# Patient Record
Sex: Male | Born: 1940 | Smoking: Never smoker
Health system: Southern US, Community
[De-identification: ages and names within clinical notes are randomized; demographics above are authoritative.]

## PROBLEM LIST (undated history)

## (undated) DIAGNOSIS — E119 Type 2 diabetes mellitus without complications: Secondary | ICD-10-CM

## (undated) DIAGNOSIS — E785 Hyperlipidemia, unspecified: Secondary | ICD-10-CM

## (undated) DIAGNOSIS — I4510 Unspecified right bundle-branch block: Secondary | ICD-10-CM

## (undated) DIAGNOSIS — A0100 Typhoid fever, unspecified: Secondary | ICD-10-CM

---

## 2012-08-19 ENCOUNTER — Encounter (HOSPITAL_COMMUNITY)
Admission: EM | Disposition: A | Discharge status: Home / Self Care | Payer: Self-pay | Source: Other Acute Inpatient Hospital | Attending: Neurosurgery

## 2012-08-19 ENCOUNTER — Encounter (HOSPITAL_COMMUNITY): Payer: Self-pay | Admitting: *Deleted

## 2012-08-19 ENCOUNTER — Inpatient Hospital Stay (HOSPITAL_COMMUNITY): Payer: BC Managed Care – PPO | Admitting: Certified Registered Nurse Anesthetist

## 2012-08-19 ENCOUNTER — Inpatient Hospital Stay (HOSPITAL_COMMUNITY)
Admission: EM | Admit: 2012-08-19 | Discharge: 2012-09-03 | DRG: 530 | Disposition: A | Discharge status: Home / Self Care | Payer: BC Managed Care – PPO | Source: Other Acute Inpatient Hospital | Attending: Neurosurgery | Admitting: Neurosurgery

## 2012-08-19 ENCOUNTER — Encounter (HOSPITAL_COMMUNITY): Payer: Self-pay | Admitting: Certified Registered Nurse Anesthetist

## 2012-08-19 DIAGNOSIS — I6200 Nontraumatic subdural hemorrhage, unspecified: Principal | ICD-10-CM | POA: Diagnosis present

## 2012-08-19 DIAGNOSIS — I129 Hypertensive chronic kidney disease with stage 1 through stage 4 chronic kidney disease, or unspecified chronic kidney disease: Secondary | ICD-10-CM | POA: Diagnosis present

## 2012-08-19 DIAGNOSIS — G9389 Other specified disorders of brain: Secondary | ICD-10-CM | POA: Diagnosis not present

## 2012-08-19 DIAGNOSIS — Z6835 Body mass index (BMI) 35.0-35.9, adult: Secondary | ICD-10-CM

## 2012-08-19 DIAGNOSIS — E785 Hyperlipidemia, unspecified: Secondary | ICD-10-CM | POA: Diagnosis present

## 2012-08-19 DIAGNOSIS — Z87891 Personal history of nicotine dependence: Secondary | ICD-10-CM

## 2012-08-19 DIAGNOSIS — E119 Type 2 diabetes mellitus without complications: Secondary | ICD-10-CM

## 2012-08-19 DIAGNOSIS — E669 Obesity, unspecified: Secondary | ICD-10-CM | POA: Diagnosis present

## 2012-08-19 DIAGNOSIS — D649 Anemia, unspecified: Secondary | ICD-10-CM

## 2012-08-19 DIAGNOSIS — F070 Personality change due to known physiological condition: Secondary | ICD-10-CM | POA: Diagnosis present

## 2012-08-19 DIAGNOSIS — E875 Hyperkalemia: Secondary | ICD-10-CM

## 2012-08-19 DIAGNOSIS — S065X9A Traumatic subdural hemorrhage with loss of consciousness of unspecified duration, initial encounter: Secondary | ICD-10-CM | POA: Diagnosis present

## 2012-08-19 DIAGNOSIS — N183 Chronic kidney disease, stage 3 unspecified: Secondary | ICD-10-CM | POA: Diagnosis present

## 2012-08-19 DIAGNOSIS — I10 Essential (primary) hypertension: Secondary | ICD-10-CM

## 2012-08-19 DIAGNOSIS — J95821 Acute postprocedural respiratory failure: Secondary | ICD-10-CM

## 2012-08-19 DIAGNOSIS — S065XAA Traumatic subdural hemorrhage with loss of consciousness status unknown, initial encounter: Secondary | ICD-10-CM

## 2012-08-19 HISTORY — DX: Hyperlipidemia, unspecified: E78.5

## 2012-08-19 HISTORY — DX: Type 2 diabetes mellitus without complications: E11.9

## 2012-08-19 HISTORY — PX: CRANIOTOMY: SHX93

## 2012-08-19 LAB — BASIC METABOLIC PANEL
Calcium: 8.9 mg/dL (ref 8.4–10.5)
GFR calc non Af Amer: 41 mL/min — ABNORMAL LOW (ref >90)
Potassium: 5.2 mEq/L — ABNORMAL HIGH (ref 3.5–5.1)
Sodium: 136 mEq/L (ref 135–145)

## 2012-08-19 LAB — CBC
Hemoglobin: 10.9 g/dL — ABNORMAL LOW (ref 13.0–17.0)
Platelets: 148 10*3/uL — ABNORMAL LOW (ref 150–400)
RBC: 3.74 MIL/uL — ABNORMAL LOW (ref 4.22–5.81)
WBC: 8.5 10*3/uL (ref 4.0–10.5)

## 2012-08-19 LAB — GLUCOSE, CAPILLARY: Glucose-Capillary: 113 mg/dL — ABNORMAL HIGH (ref 70–99)

## 2012-08-19 LAB — MRSA PCR SCREENING: MRSA by PCR: NEGATIVE

## 2012-08-19 SURGERY — CRANIOTOMY HEMATOMA EVACUATION SUBDURAL
Anesthesia: General | Site: Head | Laterality: Right | Wound class: Clean

## 2012-08-19 MED ORDER — PROMETHAZINE HCL 25 MG PO TABS: 12.5000 mg | ORAL_TABLET | ORAL | Status: DC | PRN

## 2012-08-19 MED ORDER — DEXTROSE 5 % IV SOLN
3.0000 g | INTRAVENOUS | Status: DC | PRN
  Administered 2012-08-19: 3 g via INTRAVENOUS

## 2012-08-19 MED ORDER — DEXTROSE 5 % IV SOLN
INTRAVENOUS | Status: DC | PRN
  Administered 2012-08-19: 20:00:00 via INTRAVENOUS

## 2012-08-19 MED ORDER — ONDANSETRON HCL 4 MG/2ML IJ SOLN: 4.0000 mg | INTRAMUSCULAR | Status: DC | PRN

## 2012-08-19 MED ORDER — SURGIFOAM 100 EX MISC
CUTANEOUS | Status: DC | PRN
  Administered 2012-08-19: 20:00:00 via TOPICAL

## 2012-08-19 MED ORDER — POTASSIUM CHLORIDE IN NACL 20-0.45 MEQ/L-% IV SOLN
INTRAVENOUS | Status: DC
  Administered 2012-08-19: 22:00:00 via INTRAVENOUS
  Filled 2012-08-19 (×2): qty 1000

## 2012-08-19 MED ORDER — NEOSTIGMINE METHYLSULFATE 1 MG/ML IJ SOLN
INTRAMUSCULAR | Status: DC | PRN
  Administered 2012-08-19: 5 mg via INTRAVENOUS

## 2012-08-19 MED ORDER — HYDROMORPHONE HCL PF 1 MG/ML IJ SOLN: 0.5000 mg | INTRAMUSCULAR | Status: DC | PRN

## 2012-08-19 MED ORDER — ACETAMINOPHEN 650 MG RE SUPP: 650.0000 mg | RECTAL | Status: DC | PRN

## 2012-08-19 MED ORDER — SODIUM CHLORIDE 0.9 % IV SOLN
500.0000 mg | Freq: Two times a day (BID) | INTRAVENOUS | Status: DC
  Administered 2012-08-19 – 2012-08-21 (×5): 500 mg via INTRAVENOUS
  Filled 2012-08-19 (×7): qty 5

## 2012-08-19 MED ORDER — MICROFIBRILLAR COLL HEMOSTAT EX PADS
MEDICATED_PAD | CUTANEOUS | Status: DC | PRN
  Administered 2012-08-19: 1 via TOPICAL

## 2012-08-19 MED ORDER — ACETAMINOPHEN 325 MG PO TABS
650.0000 mg | ORAL_TABLET | ORAL | Status: DC | PRN
  Administered 2012-08-24 – 2012-09-01 (×5): 650 mg via ORAL
  Filled 2012-08-19 (×5): qty 2

## 2012-08-19 MED ORDER — CEFAZOLIN SODIUM-DEXTROSE 2-3 GM-% IV SOLR
2.0000 g | Freq: Three times a day (TID) | INTRAVENOUS | Status: AC
Start: 2012-08-20 — End: 2012-08-20
  Administered 2012-08-20 (×2): 2 g via INTRAVENOUS
  Filled 2012-08-19 (×2): qty 50

## 2012-08-19 MED ORDER — ARTIFICIAL TEARS OP OINT
TOPICAL_OINTMENT | OPHTHALMIC | Status: DC | PRN
  Administered 2012-08-19: 1 via OPHTHALMIC

## 2012-08-19 MED ORDER — PHENYLEPHRINE HCL 10 MG/ML IJ SOLN
INTRAMUSCULAR | Status: DC | PRN
  Administered 2012-08-19 (×2): 80 ug via INTRAVENOUS

## 2012-08-19 MED ORDER — SODIUM CHLORIDE 0.9 % IR SOLN
Status: DC | PRN
  Administered 2012-08-19: 20:00:00

## 2012-08-19 MED ORDER — ONDANSETRON HCL 4 MG/2ML IJ SOLN: 4.0000 mg | Freq: Once | INTRAMUSCULAR | Status: DC | PRN

## 2012-08-19 MED ORDER — LIDOCAINE HCL 4 % MT SOLN
OROMUCOSAL | Status: DC | PRN
  Administered 2012-08-19: 4 mL via TOPICAL

## 2012-08-19 MED ORDER — ONDANSETRON HCL 4 MG PO TABS: 4.0000 mg | ORAL_TABLET | ORAL | Status: DC | PRN

## 2012-08-19 MED ORDER — GLYCOPYRROLATE 0.2 MG/ML IJ SOLN
INTRAMUSCULAR | Status: DC | PRN
  Administered 2012-08-19: .7 mg via INTRAVENOUS

## 2012-08-19 MED ORDER — PANTOPRAZOLE SODIUM 40 MG IV SOLR
40.0000 mg | Freq: Every day | INTRAVENOUS | Status: DC
  Administered 2012-08-19 – 2012-08-21 (×3): 40 mg via INTRAVENOUS
  Filled 2012-08-19 (×4): qty 40

## 2012-08-19 MED ORDER — ROCURONIUM BROMIDE 100 MG/10ML IV SOLN
INTRAVENOUS | Status: DC | PRN
Start: 2012-08-19 — End: 2012-08-19
  Administered 2012-08-19: 50 mg via INTRAVENOUS

## 2012-08-19 MED ORDER — ONDANSETRON HCL 4 MG/2ML IJ SOLN
INTRAMUSCULAR | Status: DC | PRN
  Administered 2012-08-19: 4 mg via INTRAVENOUS

## 2012-08-19 MED ORDER — FENTANYL CITRATE 0.05 MG/ML IJ SOLN
INTRAMUSCULAR | Status: DC | PRN
  Administered 2012-08-19: 100 ug via INTRAVENOUS
  Administered 2012-08-19 (×3): 50 ug via INTRAVENOUS

## 2012-08-19 MED ORDER — DOCUSATE SODIUM 100 MG PO CAPS
100.0000 mg | ORAL_CAPSULE | Freq: Two times a day (BID) | ORAL | Status: DC
  Administered 2012-08-20 – 2012-09-03 (×29): 100 mg via ORAL
  Filled 2012-08-19 (×30): qty 1

## 2012-08-19 MED ORDER — SODIUM CHLORIDE 0.9 % IV SOLN
INTRAVENOUS | Status: DC | PRN
  Administered 2012-08-19 (×2): via INTRAVENOUS

## 2012-08-19 MED ORDER — LABETALOL HCL 5 MG/ML IV SOLN
10.0000 mg | INTRAVENOUS | Status: DC | PRN
  Administered 2012-08-19 (×2): 10 mg via INTRAVENOUS
  Administered 2012-08-19 – 2012-08-22 (×2): 20 mg via INTRAVENOUS
  Administered 2012-08-22: 10 mg via INTRAVENOUS
  Administered 2012-08-23 – 2012-08-24 (×2): 20 mg via INTRAVENOUS
  Filled 2012-08-19 (×3): qty 4
  Filled 2012-08-19: qty 8
  Filled 2012-08-19 (×2): qty 4

## 2012-08-19 MED ORDER — LIDOCAINE HCL (CARDIAC) 20 MG/ML IV SOLN
INTRAVENOUS | Status: DC | PRN
  Administered 2012-08-19: 100 mg via INTRAVENOUS

## 2012-08-19 MED ORDER — SODIUM CHLORIDE 0.9 % IV SOLN
10.0000 mg | INTRAVENOUS | Status: DC | PRN
  Administered 2012-08-19: 50 ug/min via INTRAVENOUS

## 2012-08-19 MED ORDER — LIDOCAINE-EPINEPHRINE 1 %-1:100000 IJ SOLN
INTRAMUSCULAR | Status: DC | PRN
  Administered 2012-08-19: 10 mL via INTRADERMAL

## 2012-08-19 MED ORDER — EPHEDRINE SULFATE 50 MG/ML IJ SOLN
INTRAMUSCULAR | Status: DC | PRN
  Administered 2012-08-19: 10 mg via INTRAVENOUS

## 2012-08-19 MED ORDER — 0.9 % SODIUM CHLORIDE (POUR BTL) OPTIME
TOPICAL | Status: DC | PRN
  Administered 2012-08-19 (×2): 1000 mL

## 2012-08-19 MED ORDER — HYDROCODONE-ACETAMINOPHEN 5-325 MG PO TABS
1.0000 | ORAL_TABLET | ORAL | Status: DC | PRN
  Administered 2012-08-20 – 2012-08-24 (×3): 1 via ORAL
  Filled 2012-08-19 (×3): qty 1

## 2012-08-19 MED ORDER — PROPOFOL 10 MG/ML IV BOLUS
INTRAVENOUS | Status: DC | PRN
  Administered 2012-08-19: 150 mg via INTRAVENOUS

## 2012-08-19 MED ORDER — FENTANYL CITRATE 0.05 MG/ML IJ SOLN: 25.0000 ug | INTRAMUSCULAR | Status: DC | PRN

## 2012-08-19 SURGICAL SUPPLY — 77 items
BAG DECANTER FOR FLEXI CONT (MISCELLANEOUS) ×2 IMPLANT
BANDAGE GAUZE 4  KLING STR (GAUZE/BANDAGES/DRESSINGS) ×2 IMPLANT
BANDAGE GAUZE ELAST BULKY 4 IN (GAUZE/BANDAGES/DRESSINGS) ×2 IMPLANT
BIT DRILL WIRE PASS 1.3MM (BIT) ×1 IMPLANT
BLADE SURG 11 STRL SS (BLADE) IMPLANT
BLADE SURG ROTATE 9660 (MISCELLANEOUS) ×2 IMPLANT
BNDG COHESIVE 4X5 TAN NS LF (GAUZE/BANDAGES/DRESSINGS) IMPLANT
BRUSH SCRUB EZ PLAIN DRY (MISCELLANEOUS) ×2 IMPLANT
BUR ACORN 9.0 PRECISION (BURR) ×2 IMPLANT
BUR ROUTER D-58 CRANI (BURR) ×2 IMPLANT
CANISTER SUCTION 2500CC (MISCELLANEOUS) ×2 IMPLANT
CATH FOLEY 2WAY SLVR  5CC 16FR (CATHETERS) ×1
CATH FOLEY 2WAY SLVR 5CC 16FR (CATHETERS) ×1 IMPLANT
CLIP TI MEDIUM 6 (CLIP) IMPLANT
CLOTH BEACON ORANGE TIMEOUT ST (SAFETY) ×2 IMPLANT
CONT SPEC 4OZ CLIKSEAL STRL BL (MISCELLANEOUS) ×2 IMPLANT
CORDS BIPOLAR (ELECTRODE) ×2 IMPLANT
COVER TABLE BACK 60X90 (DRAPES) ×4 IMPLANT
DECANTER SPIKE VIAL GLASS SM (MISCELLANEOUS) ×2 IMPLANT
DERMABOND ADVANCED (GAUZE/BANDAGES/DRESSINGS) ×1
DERMABOND ADVANCED .7 DNX12 (GAUZE/BANDAGES/DRESSINGS) ×1 IMPLANT
DRAIN CHANNEL 10M FLAT 3/4 FLT (DRAIN) ×2 IMPLANT
DRAPE NEUROLOGICAL W/INCISE (DRAPES) ×2 IMPLANT
DRAPE SURG 17X23 STRL (DRAPES) IMPLANT
DRAPE WARM FLUID 44X44 (DRAPE) ×2 IMPLANT
DRILL WIRE PASS 1.3MM (BIT) ×2
DRSG OPSITE 4X5.5 SM (GAUZE/BANDAGES/DRESSINGS) IMPLANT
DRSG OPSITE POSTOP 4X6 (GAUZE/BANDAGES/DRESSINGS) ×2 IMPLANT
ELECT CAUTERY BLADE 6.4 (BLADE) ×2 IMPLANT
ELECT REM PT RETURN 9FT ADLT (ELECTROSURGICAL) ×2
ELECTRODE REM PT RTRN 9FT ADLT (ELECTROSURGICAL) ×1 IMPLANT
EVACUATOR SILICONE 100CC (DRAIN) ×2 IMPLANT
GAUZE SPONGE 4X4 16PLY XRAY LF (GAUZE/BANDAGES/DRESSINGS) IMPLANT
GLOVE BIO SURGEON STRL SZ 6.5 (GLOVE) ×2 IMPLANT
GLOVE BIO SURGEON STRL SZ7 (GLOVE) ×4 IMPLANT
GLOVE BIO SURGEON STRL SZ8 (GLOVE) ×2 IMPLANT
GLOVE BIOGEL PI IND STRL 6.5 (GLOVE) ×1 IMPLANT
GLOVE BIOGEL PI INDICATOR 6.5 (GLOVE) ×1
GLOVE EXAM NITRILE LRG STRL (GLOVE) IMPLANT
GLOVE EXAM NITRILE MD LF STRL (GLOVE) ×2 IMPLANT
GLOVE EXAM NITRILE XL STR (GLOVE) IMPLANT
GLOVE EXAM NITRILE XS STR PU (GLOVE) IMPLANT
GLOVE INDICATOR 8.5 STRL (GLOVE) ×2 IMPLANT
GOWN BRE IMP SLV AUR LG STRL (GOWN DISPOSABLE) ×2 IMPLANT
GOWN BRE IMP SLV AUR XL STRL (GOWN DISPOSABLE) ×2 IMPLANT
GOWN STRL REIN 2XL LVL4 (GOWN DISPOSABLE) ×2 IMPLANT
HEMOSTAT SURGICEL 2X14 (HEMOSTASIS) ×2 IMPLANT
HOOK DURA (MISCELLANEOUS) IMPLANT
KIT BASIN OR (CUSTOM PROCEDURE TRAY) ×2 IMPLANT
KIT ROOM TURNOVER OR (KITS) ×2 IMPLANT
NEEDLE HYPO 25X1 1.5 SAFETY (NEEDLE) ×2 IMPLANT
NS IRRIG 1000ML POUR BTL (IV SOLUTION) ×4 IMPLANT
PACK CRANIOTOMY (CUSTOM PROCEDURE TRAY) ×2 IMPLANT
PAD ARMBOARD 7.5X6 YLW CONV (MISCELLANEOUS) ×6 IMPLANT
PAD EYE OVAL STERILE LF (GAUZE/BANDAGES/DRESSINGS) IMPLANT
PATTIES SURGICAL .25X.25 (GAUZE/BANDAGES/DRESSINGS) IMPLANT
PATTIES SURGICAL .5 X.5 (GAUZE/BANDAGES/DRESSINGS) IMPLANT
PATTIES SURGICAL .5 X3 (DISPOSABLE) IMPLANT
PATTIES SURGICAL 1X1 (DISPOSABLE) IMPLANT
PIN MAYFIELD SKULL DISP (PIN) IMPLANT
PLATE DOUBLE 6 HOLE (Plate) ×4 IMPLANT
SCREW SELF DRILL HT 1.5/4MM (Screw) ×18 IMPLANT
SPONGE GAUZE 4X4 12PLY (GAUZE/BANDAGES/DRESSINGS) ×2 IMPLANT
SPONGE NEURO XRAY DETECT 1X3 (DISPOSABLE) IMPLANT
SPONGE SURGIFOAM ABS GEL 100 (HEMOSTASIS) ×2 IMPLANT
STAPLER SKIN PROX WIDE 3.9 (STAPLE) IMPLANT
STAPLER VISISTAT 35W (STAPLE) ×4 IMPLANT
SUT ETHILON 3 0 FSL (SUTURE) IMPLANT
SUT NURALON 4 0 TR CR/8 (SUTURE) ×4 IMPLANT
SUT VIC AB 2-0 CT1 18 (SUTURE) ×4 IMPLANT
SYR 20ML ECCENTRIC (SYRINGE) ×2 IMPLANT
SYR CONTROL 10ML LL (SYRINGE) ×2 IMPLANT
TOWEL OR 17X24 6PK STRL BLUE (TOWEL DISPOSABLE) ×2 IMPLANT
TOWEL OR 17X26 10 PK STRL BLUE (TOWEL DISPOSABLE) ×2 IMPLANT
TRAY FOLEY CATH 14FRSI W/METER (CATHETERS) IMPLANT
UNDERPAD 30X30 INCONTINENT (UNDERPADS AND DIAPERS) IMPLANT
WATER STERILE IRR 1000ML POUR (IV SOLUTION) ×2 IMPLANT

## 2012-08-19 NOTE — Transfer of Care (Signed)
Immediate Anesthesia Transfer of Care Note  Patient: Austin Salas  Procedure(s) Performed: Procedure(s): Right CRANIOTOMY for HEMATOMA EVACUATION SUBDURAL (Right)  Patient Location: PACU  Anesthesia Type:General  Level of Consciousness: awake, alert  and patient cooperative  Airway & Oxygen Therapy: Patient Spontanous Breathing and Patient connected to face mask oxygen  Post-op Assessment: Report given to PACU RN, Post -op Vital signs reviewed and stable, Patient moving all extremities X 4 and Patient able to stick tongue midline  Post vital signs: Reviewed and stable  Complications: No apparent anesthesia complications

## 2012-08-19 NOTE — Anesthesia Procedure Notes (Signed)
Procedure Name: Intubation Date/Time: 08/19/2012 7:46 PM Performed by: Julianne Rice Z Pre-anesthesia Checklist: Patient identified, Emergency Drugs available, Suction available, Patient being monitored and Timeout performed Patient Re-evaluated:Patient Re-evaluated prior to inductionOxygen Delivery Method: Circle system utilized Preoxygenation: Pre-oxygenation with 100% oxygen Intubation Type: IV induction Ventilation: Mask ventilation without difficulty Laryngoscope Size: Mac and 4 Grade View: Grade I Tube type: Oral Tube size: 8.0 mm Number of attempts: 1 Airway Equipment and Method: Stylet and LTA kit utilized Placement Confirmation: ETT inserted through vocal cords under direct vision,  breath sounds checked- equal and bilateral and positive ETCO2 Secured at: 23 cm Tube secured with: Tape Dental Injury: Teeth and Oropharynx as per pre-operative assessment

## 2012-08-19 NOTE — H&P (Signed)
Austin Salas is an 72 y.o. male.   Chief Complaint: Right subdural hematoma HPI: Austin Salas is a 72 year old gentleman who presented on referral from [facility]. Austin Salas had been sent to [facility] from his primary care doctor where he was being evaluated for dizziness headaches and weakness in his legs. Austin Salas reports headaches began on Monday he denies any known history of trauma said he had another fall today after his legs one week and gave way. But denies having hit his head any time in [place] last week or 2. On evaluation in [place] [facility] in [facility] head CT revealed an acute on subacute subdural hematoma with mass effect and midline shift. We have been consult on Austin Salas accepted in transfer. Currently Austin Salas is complaining of headache no nausea or vomiting no numbness and tingling in his arms or his legs.  No past medical history on file.  No past surgical history on file.  No family history on file. Social History:  has no tobacco, alcohol, and drug history on file.  Allergies: Allergies not on file  No prescriptions prior to admission    No results found for this or any previous visit (from [place] past 48 hour(s)). No results found.  Review of Systems  Constitutional: Negative.   Eyes: Negative.   Respiratory: Negative.   Cardiovascular: Negative.   Gastrointestinal: Negative.   Genitourinary: Negative.   Musculoskeletal: Negative.   Skin: Negative.   Neurological: Positive for focal weakness and headaches.  Endo/Heme/Allergies: Negative.   Psychiatric/Behavioral: Negative.     There were no vitals taken for this visit. Physical Exam  Constitutional: He is oriented to person, place, and time. He appears well-developed and well-nourished.  HENT:  Head: Normocephalic.  Eyes: Pupils are equal, round, and reactive to light.  Neck: Normal range of motion.  Cardiovascular: Normal rate.   Respiratory: Effort normal.  GI: Soft.  Musculoskeletal: Normal range of  motion.  Neurological: He is alert and oriented to person, place, and time. GCS eye subscore is 4. GCS verbal subscore is 5. GCS motor subscore is 6.  Reflex Scores:      Brachioradialis reflexes are 0 on [place] right side and 0 on [place] left side.      Patellar reflexes are 0 on [place] right side and 0 on [place] left side.      Achilles reflexes are 0 on [place] right side and 0 on [place] left side. [place] Salas is awake and alert he is oriented x3 pupils are equal extraocular movements are intact he moves all extremities with strength at about 5 out of 5 however he does have a slight left pronator drift.     Assessment/Plan 72 year old woman presents with an acute on subacute subdural hematoma with midline shift I have extensively reviewed with Austin Salas and [place] family that is here which does not include his wife for direct family [place] diagnosis and procedure we are undertaking. We will continue to be worked up Austin Salas with anesthesia for potential craniotomy will await [place] wife's arrival. In [place] ER Austin Salas was noted to have a bump in his troponins and CK-MB's. I've discussed this with Dr. Sharyn Lull [place] cardiologist on call and he says he does not need to limit are going back for surgery he did not think necessarily represents a primary cardiac event.  Tymothy Cass P 08/19/2012, 6:51 PM

## 2012-08-19 NOTE — Anesthesia Preprocedure Evaluation (Signed)
Anesthesia Evaluation  Patient identified by MRN, date of birth, ID band Patient awake    Reviewed: Allergy & Precautions, H&P , NPO status , Patient's Chart, lab work & pertinent test results  Airway Mallampati: II TM Distance: >3 FB Neck ROM: Full    Dental  (+) Edentulous Upper, Edentulous Lower and Dental Advisory Given   Pulmonary  breath sounds clear to auscultation        Cardiovascular hypertension, Pt. on medications Rhythm:Regular Rate:Normal     Neuro/Psych    GI/Hepatic   Endo/Other  diabetesMorbid obesity  Renal/GU      Musculoskeletal   Abdominal   Peds  Hematology   Anesthesia Other Findings   Reproductive/Obstetrics                           Anesthesia Physical Anesthesia Plan  ASA: IV  Anesthesia Plan: General   Post-op Pain Management:    Induction: Intravenous  Airway Management Planned: Oral ETT  Additional Equipment: Arterial line  Intra-op Plan:   Post-operative Plan: Extubation in OR  Informed Consent: I have reviewed the patients History and Physical, chart, labs and discussed the procedure including the risks, benefits and alternatives for the proposed anesthesia with the patient or authorized representative who has indicated his/her understanding and acceptance.   Dental advisory given  Plan Discussed with: CRNA, Anesthesiologist and Surgeon  Anesthesia Plan Comments:         Anesthesia Quick Evaluation

## 2012-08-19 NOTE — Plan of Care (Signed)
Problem: Consults Goal: Diagnosis - Craniotomy Subdural hematoma  Comments:  Mercy Moore

## 2012-08-19 NOTE — Anesthesia Postprocedure Evaluation (Signed)
  Anesthesia Post-op Note  Patient: Austin Salas  Procedure(s) Performed: Procedure(s): Right CRANIOTOMY for HEMATOMA EVACUATION SUBDURAL (Right)  Patient Location: PACU  Anesthesia Type:General  Level of Consciousness: awake, alert  and oriented  Airway and Oxygen Therapy: Patient Spontanous Breathing and Patient connected to nasal cannula oxygen  Post-op Pain: none  Post-op Assessment: Post-op Vital signs reviewed  Post-op Vital Signs: Reviewed  Complications: No apparent anesthesia complications

## 2012-08-19 NOTE — Preoperative (Signed)
Beta Blockers   Reason not to administer Beta Blockers:Not Applicable 

## 2012-08-19 NOTE — Op Note (Signed)
Preoperative diagnosis right-sided acute on subacute subdural hematoma  Postoperative diagnosis: Same  Procedure: Craniotomy for evacuation of an acute on subacute right subdural hematoma  Surgeon: Jillyn Hidden Tae Robak  Anesthesia: Gen.  EBL: Minimal  History of present illness: Patient is a very pleasant 72 year old son who presented on transfer from [facility] where is noted to have a large acute on subacute subdural hematoma. Patient had significant mass effect from the subdural hematoma had symptoms of leg weakness confusion headaches and left arm weakness. Patient was emergently taken the operating room after extensive discussions were carried out the patient's family regarding the risks and benefits of evacuation of acute on subacute right subdural hematoma. They understood the risks and benefits, alternatives of surgery perioperative course and expectations of outcome.  Operative procedure: Patient brought into the or was induced under general anesthesia positioned supine with a shoulder bump under the right shoulder the head turned to the left side in the right side his head was shaved prepped and draped in routine sterile fashion. A linear incision was drawn out along the superotemporal line from just behind the hairline finally back parietally occipitally. Subperiosteal dissection was carried out to expose the cranium a single burr hole was drilled and then a circular flap was turned the dura was incised in a cruciate fashion a organized subdural member was immediately visualized this was incised and dark appearing old blood that appeared to be in the range of 9-81 weeks old and medially came out under pressure. This is all aspirated and then a small thin membrane overlying cortex was also lysed and this allowed some additional egress of some older fluid. Some additional membrane was resected remove the cortex was easily visualized after copious irrigation) rubber catheter as well as in the  craniotomy site additional all blood was removed. Overtime during the evacuation of brain continued to reexpand and filled the space. After adequate evacuation of the hematoma had been achieved the cortex was easily visualized no further membranes were visualized and there was no further bleeding appreciated with a irrigant being clear. A drain was then placed through the burr hole the flap was then reapplied with the Biomet plating system the temporalis was reapproximated and the galea was closed with 2-0 Vicryl skin was closed staples the wounds dressed and patient went to recovery in the ICU in stable condition. At the end of case on it counts and sponge counts were correct.

## 2012-08-19 NOTE — Plan of Care (Deleted)
Problem: Consults Goal: Diagnosis - Craniotomy Subdural hematoma     

## 2012-08-20 ENCOUNTER — Inpatient Hospital Stay (HOSPITAL_COMMUNITY): Payer: BC Managed Care – PPO

## 2012-08-20 ENCOUNTER — Encounter (HOSPITAL_COMMUNITY): Payer: Self-pay | Admitting: Radiology

## 2012-08-20 DIAGNOSIS — S065X9A Traumatic subdural hemorrhage with loss of consciousness of unspecified duration, initial encounter: Secondary | ICD-10-CM | POA: Diagnosis present

## 2012-08-20 DIAGNOSIS — D649 Anemia, unspecified: Secondary | ICD-10-CM | POA: Diagnosis present

## 2012-08-20 DIAGNOSIS — I10 Essential (primary) hypertension: Secondary | ICD-10-CM

## 2012-08-20 DIAGNOSIS — E119 Type 2 diabetes mellitus without complications: Secondary | ICD-10-CM | POA: Diagnosis present

## 2012-08-20 DIAGNOSIS — I6200 Nontraumatic subdural hemorrhage, unspecified: Principal | ICD-10-CM

## 2012-08-20 LAB — BASIC METABOLIC PANEL
BUN: 38 mg/dL — ABNORMAL HIGH (ref 6–23)
CO2: 18 mEq/L — ABNORMAL LOW (ref 19–32)
CO2: 19 mEq/L (ref 19–32)
Calcium: 9.3 mg/dL (ref 8.4–10.5)
Chloride: 105 mEq/L (ref 96–112)
Creatinine, Ser: 1.63 mg/dL — ABNORMAL HIGH (ref 0.50–1.35)
Creatinine, Ser: 1.7 mg/dL — ABNORMAL HIGH (ref 0.50–1.35)
Glucose, Bld: 160 mg/dL — ABNORMAL HIGH (ref 70–99)
Glucose, Bld: 194 mg/dL — ABNORMAL HIGH (ref 70–99)

## 2012-08-20 LAB — GLUCOSE, CAPILLARY

## 2012-08-20 MED ORDER — SODIUM POLYSTYRENE SULFONATE 15 GM/60ML PO SUSP
15.0000 g | Freq: Four times a day (QID) | ORAL | Status: AC
  Administered 2012-08-20 (×2): 15 g via ORAL
  Filled 2012-08-20 (×2): qty 60

## 2012-08-20 MED ORDER — SODIUM CHLORIDE 0.9 % IV SOLN
INTRAVENOUS | Status: DC
  Administered 2012-08-20: 20 mL/h via INTRAVENOUS
  Administered 2012-08-24: 05:00:00 via INTRAVENOUS

## 2012-08-20 MED ORDER — DEXTROSE 50 % IV SOLN
1.0000 | Freq: Once | INTRAVENOUS | Status: AC
  Administered 2012-08-20: 50 mL via INTRAVENOUS
  Filled 2012-08-20: qty 50

## 2012-08-20 MED ORDER — INSULIN ASPART 100 UNIT/ML ~~LOC~~ SOLN
10.0000 [IU] | Freq: Once | SUBCUTANEOUS | Status: AC
  Administered 2012-08-20: 10 [IU] via INTRAVENOUS

## 2012-08-20 MED ORDER — CEFAZOLIN SODIUM-DEXTROSE 2-3 GM-% IV SOLR
2.0000 g | Freq: Three times a day (TID) | INTRAVENOUS | Status: DC
  Administered 2012-08-20 – 2012-08-21 (×2): 2 g via INTRAVENOUS
  Filled 2012-08-20 (×4): qty 50

## 2012-08-20 MED ORDER — INSULIN ASPART 100 UNIT/ML ~~LOC~~ SOLN
0.0000 [IU] | Freq: Three times a day (TID) | SUBCUTANEOUS | Status: DC
  Administered 2012-08-20: 3 [IU] via SUBCUTANEOUS
  Administered 2012-08-21: 2 [IU] via SUBCUTANEOUS
  Administered 2012-08-21: 3 [IU] via SUBCUTANEOUS
  Administered 2012-08-22: 2 [IU] via SUBCUTANEOUS
  Administered 2012-08-22: 5 [IU] via SUBCUTANEOUS
  Administered 2012-08-22: 3 [IU] via SUBCUTANEOUS
  Administered 2012-08-23 (×2): 2 [IU] via SUBCUTANEOUS
  Administered 2012-08-23: 3 [IU] via SUBCUTANEOUS
  Administered 2012-08-24: 2 [IU] via SUBCUTANEOUS
  Administered 2012-08-24 – 2012-08-25 (×3): 3 [IU] via SUBCUTANEOUS
  Administered 2012-08-25 – 2012-08-27 (×5): 2 [IU] via SUBCUTANEOUS
  Administered 2012-08-27 – 2012-08-28 (×2): 3 [IU] via SUBCUTANEOUS
  Administered 2012-08-28 (×2): 2 [IU] via SUBCUTANEOUS
  Administered 2012-08-29 (×2): 3 [IU] via SUBCUTANEOUS
  Administered 2012-08-30 – 2012-08-31 (×2): 2 [IU] via SUBCUTANEOUS
  Administered 2012-08-31: 3 [IU] via SUBCUTANEOUS
  Administered 2012-09-01: 5 [IU] via SUBCUTANEOUS
  Administered 2012-09-01 – 2012-09-02 (×3): 2 [IU] via SUBCUTANEOUS
  Administered 2012-09-02: 5 [IU] via SUBCUTANEOUS
  Administered 2012-09-03 (×2): 2 [IU] via SUBCUTANEOUS

## 2012-08-20 MED ORDER — SIMVASTATIN 10 MG PO TABS
10.0000 mg | ORAL_TABLET | Freq: Every day | ORAL | Status: DC
  Administered 2012-08-20: 10 mg via ORAL
  Filled 2012-08-20 (×2): qty 1

## 2012-08-20 NOTE — Evaluation (Signed)
Physical Therapy Evaluation Patient Details Name: Austin Salas MRN: [id number] DOB: 1940-04-29 Today's Date: 08/20/2012 Time: 8295-6213 PT Time Calculation (min): 28 min  PT Assessment / Plan / Recommendation Clinical Impression  Patient is a 72 y/o male admitted with ICH s/p burr hole for evacuation.  Presents with decreased independence with mobility due to decreased balance, decreased activity tolerance, decreased strength and will benefit from skilled PT in the acute setting to maximize independence and allow return home with HHPT and intermittent spouse assist.    PT Assessment  Patient needs continued PT services    Follow Up Recommendations  Home health PT;Supervision - Intermittent       Barriers to Discharge Decreased caregiver support      Equipment Recommendations  Rolling walker with 5" wheels    Recommendations for Other Services   OT consult  Frequency Min 3X/week    Precautions / Restrictions Precautions Precautions: Fall Precaution Comments: drain from back of head, art line right UE   Pertinent Vitals/Pain Min c/o pain at back of head      Mobility  Bed Mobility Bed Mobility: Supine to Sit;Sitting - Scoot to Edge of Bed Supine to Sit: 3: Mod assist;HOB elevated Sitting - Scoot to [place] of Bed: 4: Min assist Transfers Transfers: Sit to Stand;Stand to Sit Sit to Stand: 1: +2 Total assist;From bed Sit to Stand: Patient Percentage: 80% Stand to Sit: To chair/3-in-1;With armrests;3: Mod assist Details for Transfer Assistance: assist to lower safely in chair Ambulation/Gait Ambulation/Gait Assistance: 4: Min assist (+1 for line management) Ambulation Distance (Feet): 120 Feet Assistive device: Rolling walker Gait Pattern: Step-through pattern;Decreased stride length;Trunk flexed;Decreased step length - left;Wide base of support        PT Diagnosis: Abnormality of gait;Generalized weakness  PT Problem List: Decreased strength;Decreased activity  tolerance;Decreased knowledge of use of DME;Decreased balance;Decreased mobility PT Treatment Interventions: DME instruction;Balance training;Gait training;Stair training;Functional mobility training;Patient/family education;Therapeutic activities;Therapeutic exercise   PT Goals Acute Rehab PT Goals PT Goal Formulation: With patient Time For Goal Achievement: 08/27/12 Potential to Achieve Goals: Good Pt will go Supine/Side to Sit: with modified independence PT Goal: Supine/Side to Sit - Progress: Goal set today Pt will go Sit to Stand: with modified independence PT Goal: Sit to Stand - Progress: Goal set today Pt will Transfer Bed to Chair/Chair to Bed: with modified independence PT Transfer Goal: Bed to Chair/Chair to Bed - Progress: Goal set today Pt will Ambulate: >150 feet;with rolling walker;with modified independence PT Goal: Ambulate - Progress: Goal set today Pt will Go Up / Down Stairs: 3-5 stairs;with rail(s);with supervision PT Goal: Up/Down Stairs - Progress: Goal set today  Visit Information  Last PT Received On: 08/20/12 Assistance Needed: +1    Subjective Data  Subjective: I just had a bad headache and was stumbling around even at work past few days. 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 Communication: No difficulties Dominant Hand:  Right    Cognition  Cognition Arousal/Alertness: Awake/alert Behavior During Therapy: [facility]  for tasks assessed/performed Overall Cognitive Status: Within Functional Limits for tasks assessed    Extremity/Trunk Assessment Right Upper Extremity Assessment RUE ROM/Strength/Tone: Within functional levels Left Upper Extremity Assessment LUE ROM/Strength/Tone: Within functional levels Right Lower Extremity Assessment RLE ROM/Strength/Tone: [facility] for tasks assessed Left Lower Extremity Assessment LLE ROM/Strength/Tone: [facility] for tasks assessed   Balance Balance Balance Assessed: Yes Static Sitting Balance Static Sitting - Balance Support: Feet supported;Bilateral upper extremity supported Static Sitting - Level of Assistance: 5: Stand by assistance  End of Session PT - End of Session Equipment Utilized During Treatment: Gait belt Activity Tolerance: Patient tolerated treatment well Patient left: in chair;with call bell/phone within reach Nurse Communication: Mobility status  GP     [facility] 08/20/2012, 4:15 PM Sheran Lawless, PT [phone] 08/20/2012

## 2012-08-20 NOTE — Consult Note (Signed)
PULMONARY  / CRITICAL CARE MEDICINE  Name: Austin Salas MRN: [id number] DOB: 1940-08-15    ADMISSION DATE:  08/19/2012 CONSULTATION DATE:  08/19/2012  REFERRING MD :  Wynetta Emery PRIMARY SERVICE: NSGY  CHIEF COMPLAINT:  Medical management SDH  BRIEF PATIENT DESCRIPTION: 72 y/o male with hypertension and DM2 was admitted on 5/28 for evacuation of a Subdural Hematoma.  PCCM consulted for medical management.  SIGNIFICANT EVENTS / STUDIES:  5/28 CT head (OSH) > large frontal R SDH with 11mm midline shift 5/28 craniotomy for evacuation of subacute R SDH  LINES / TUBES: 5/28 R radial a-line  CULTURES:   ANTIBIOTICS:   HISTORY OF PRESENT ILLNESS:  72 y/o male with hypertension and DM2 was admitted on 5/28 for evacuation of a Subdural Hematoma.  PCCM consulted for medical management.  He did not recall a fall in the weeks preceding 5/28, but he went to the [facility] ED that day because of headaches, confusion, left arm weakness, and leg weakness.  He was found to have a large sub-dural so he was transferred to [facility] for a craniotomy by NSGY.  He tolerated the procedure well without complication, was extubated, and then transferred to 3100 for further management.  On my arrival he had a slight headache but was feeling well otherwise.  He did not have weakness or slurred speech.  PAST MEDICAL HISTORY :  Past Medical History  Diagnosis Date  . Diabetes mellitus without complication   . Hyperlipidemia    History reviewed. No pertinent past surgical history. Prior to Admission medications   Medication Sig Start Date End Date Taking? Authorizing Provider  ASPIRIN PO Take by mouth.   Yes Historical Provider, MD  METFORMIN HCL PO Take by mouth.   Yes Historical Provider, MD  SIMVASTATIN PO Take by mouth.   Yes Historical Provider, MD   Not on File  FAMILY HISTORY:  History reviewed. No pertinent family history. SOCIAL HISTORY:  reports that he has never smoked. He has never used  smokeless tobacco. He reports that he does not drink alcohol or use illicit drugs.  REVIEW OF SYSTEMS:   Gen: Denies fever, chills, weight change, fatigue, night sweats HEENT: Denies blurred vision, double vision, hearing loss, tinnitus, sinus congestion, rhinorrhea, sore throat, neck stiffness, dysphagia PULM: Denies shortness of breath, cough, sputum production, hemoptysis, wheezing CV: Denies chest pain, edema, orthopnea, paroxysmal nocturnal dyspnea, palpitations GI: Denies abdominal pain, nausea, vomiting, diarrhea, hematochezia, melena, constipation, change in bowel habits GU: Denies dysuria, hematuria, polyuria, oliguria, urethral discharge Endocrine: Denies hot or cold intolerance, polyuria, polyphagia or appetite change Derm: Denies rash, dry skin, scaling or peeling skin change Heme: Denies easy bruising, bleeding, bleeding gums Neuro: per HPI   SUBJECTIVE:   VITAL SIGNS: Temp:  [97.1 F (36.2 C)-97.9 F (36.6 C)] 97.6 F (36.4 C) (05/29 0000) Pulse Rate:  [56-70] 68 (05/29 0000) Resp:  [15-20] 18 (05/29 0000) BP: (125-139)/(41-58) 125/41 mmHg (05/29 0000) SpO2:  [94 %-100 %] 97 % (05/29 0000) Arterial Line BP: (146-175)/(43-59) 147/57 mmHg (05/29 0000) Weight:  [120.1 kg (264 lb 12.4 oz)] 120.1 kg (264 lb 12.4 oz) (05/28 2200) HEMODYNAMICS:   VENTILATOR SETTINGS:   INTAKE / OUTPUT: Intake/Output     05/28 0701 - 05/29 0700   I.V. (mL/kg) 1693.8 (14.1)   IV Piggyback 105   Total Intake(mL/kg) 1798.8 (15)   Urine (mL/kg/hr) 710   Blood 150   Total Output 860   Net +938.8  PHYSICAL EXAMINATION:  Gen: well appearing, no acute distress HEENT: head dressing in place, PERRL, EOMi, OP clear, neck supple without masses PULM: CTA B CV: RRR, no mgr, no JVD AB: BS+, soft, nontender, no hsm Ext: warm, no edema, no clubbing, no cyanosis Derm: no rash or skin breakdown Neuro: A&Ox4, CN II-XII intact, MAEW   LABS:  Recent Labs Lab 08/19/12 2200  HGB  10.9*  WBC 8.5  PLT 148*  NA 136  K 5.2*  CL 106  CO2 20  GLUCOSE 156*  BUN 42*  CREATININE 1.63*  CALCIUM 8.9    Recent Labs Lab 08/19/12 2143  GLUCAP 113*    CXR:   ASSESSMENT / PLAN:  NEUROLOGIC A:  Subacute Subdural hematoma with midline shift> resolved post craniotomy and evacuation P:   -post op care per NSGY -keppra per NSGY  PULMONARY A: No acute issues P:   -monitor O2 saturation  CARDIOVASCULAR A: Hypertension P:  -labetalol prn SBP >160 -restart home BP meds 5/29 AM when wife brings med list -tele  RENAL A:  CKD, baseline Cr 1.8 according to OSH labs P:   -monitor UOP -BMET in AM  GASTROINTESTINAL A:  No acute issues P:   -advance diet as tolerated  HEMATOLOGIC A:  Normocytic anemia without active bleeding, anemia of CKD? P:  -check Fe studies -cbc in am  INFECTIOUS A:  No acute issues P:  -monitor fever curve  ENDOCRINE A:  DM2 P:   -SSI  TODAY'S SUMMARY:72 y/o male with HTN, DM2, was admitted on 5/28 for evacuation of a R subdural hematoma causing midline shift.  Doing well post op, PCCM consulted for medical management.   Fonnie Jarvis Pulmonary and Critical Care Medicine [facility] Pager: [phone]  08/20/2012, 12:18 AM

## 2012-08-20 NOTE — Progress Notes (Signed)
PULMONARY  / CRITICAL CARE MEDICINE  Name: Austin Salas MRN: [id number] DOB: Jan 09, 1941    ADMISSION DATE:  08/19/2012 CONSULTATION DATE:  08/19/2012  REFERRING MD :  Wynetta Emery PRIMARY SERVICE: NSGY  CHIEF COMPLAINT:  Medical management SDH  BRIEF PATIENT DESCRIPTION: 72 y/o male with hypertension and DM2 was admitted on 5/28 for evacuation of a Subdural Hematoma.  PCCM consulted for medical management.  SIGNIFICANT EVENTS / STUDIES:  5/28 CT head (OSH) > large frontal R SDH with 11mm midline shift 5/28 craniotomy for evacuation of subacute R SDH 5/29 CT head >>   LINES / TUBES: 5/28 R radial a-line 5/28 R intracranial drain >>   CULTURES: MRSA screen 5/28 >> negative  ANTIBIOTICS: Cefazolin 5/28 post op x 2  HISTORY OF PRESENT ILLNESS:  72 y/o male with hypertension and DM2 was admitted on 5/28 for evacuation of a Subdural Hematoma.  PCCM consulted for medical management.  He did not recall a fall in the weeks preceding 5/28, but he went to the [facility] ED that day because of headaches, confusion, left arm weakness, and leg weakness.  He was found to have a large sub-dural so he was transferred to [facility] for a craniotomy by NSGY.  He tolerated the procedure well without complication, was extubated, and then transferred to 3100 for further management.  On my arrival he had a slight headache but was feeling well otherwise.  He did not have weakness or slurred speech.  SUBJECTIVE:  Has a slight HA Tolerated ice chips Repeat CT head done early this am  VITAL SIGNS: Temp:  [97.1 F (36.2 C)-98.3 F (36.8 C)] 98.3 F (36.8 C) (05/29 0400) Pulse Rate:  [56-70] 60 (05/29 0700) Resp:  [15-20] 17 (05/29 0700) BP: (125-149)/(41-62) 145/57 mmHg (05/29 0700) SpO2:  [92 %-100 %] 97 % (05/29 0700) Arterial Line BP: (146-187)/(43-59) 146/56 mmHg (05/29 0700) Weight:  [120.1 kg (264 lb 12.4 oz)] 120.1 kg (264 lb 12.4 oz) (05/28 2200) HEMODYNAMICS:   VENTILATOR SETTINGS:   INTAKE /  OUTPUT: Intake/Output     05/28 0701 - 05/29 0700 05/29 0701 - 05/30 0700   I.V. (mL/kg) 2218.8 (18.5)    IV Piggyback 155    Total Intake(mL/kg) 2373.8 (19.8)    Urine (mL/kg/hr) 1410    Blood 150    Total Output 1560     Net +813.8            PHYSICAL EXAMINATION:  Gen: well appearing, no acute distress HEENT: head dressing in place, PERRL, EOMi, OP clear, neck supple without masses PULM: CTA B CV: RRR, no mgr, no JVD AB: BS+, soft, nontender, no hsm Ext: warm, no edema, no clubbing, no cyanosis Derm: no rash or skin breakdown Neuro: A&Ox4, CN II-XII intact, MAEW   LABS:  Recent Labs Lab 08/19/12 2200  HGB 10.9*  WBC 8.5  PLT 148*  NA 136  K 5.2*  CL 106  CO2 20  GLUCOSE 156*  BUN 42*  CREATININE 1.63*  CALCIUM 8.9    Recent Labs Lab 08/19/12 2143  GLUCAP 113*    CXR:   ASSESSMENT / PLAN:  NEUROLOGIC A:  Subacute Subdural hematoma with midline shift> resolved post craniotomy and evacuation P:   -post op care per NSGY -keppra per NSGY  PULMONARY A: No acute issues P:   -monitor O2 saturation -pulm toilet  CARDIOVASCULAR A: Hypertension P:  -labetalol prn SBP >160 -restart home BP meds 5/29 AM when wife brings med list -tele  RENAL  A:  CKD, baseline Cr 1.8 according to OSH labs Hyperkalemia (labs 3/28 22:00) P:   -recheck BMP now -change IVF from d5 1/2ns + KCl to NS at [facility] -monitor UOP -BMP in AM 5/30  GASTROINTESTINAL A:  No acute issues P:   -advance diet as tolerated  HEMATOLOGIC A:  Normocytic anemia without active bleeding, anemia of CKD? P:  -check Fe studies -cbc in am  INFECTIOUS A:  No acute issues P:  -monitor fever curve  ENDOCRINE A:  DM2 P:   -SSI  TODAY'S SUMMARY:72 y/o male with HTN, DM2, was admitted on 5/28 for evacuation of a R subdural hematoma causing midline shift.  Doing well post op, PCCM consulted for medical management.   Levy Pupa S.,MD Pulmonary and Critical Care Medicine [facility] Pager: [phone]  08/20/2012, 7:54 AM

## 2012-08-20 NOTE — Progress Notes (Signed)
PCCM Interval Note  Follow up BMP with worsening hyperkalemia  Recent Labs Lab 08/19/12 2200 08/20/12 0825  NA 136 133*  K 5.2* 6.2*  CL 106 105  CO2 20 18*  GLUCOSE 156* 160*  BUN 42* 38*  CREATININE 1.63* 1.63*  CALCIUM 8.9 9.1   Will treat w kayexcelate now, d50 + insulin.  Repeat BMP this pm   Levy Pupa, MD, PhD 08/20/2012, 9:48 AM  Pulmonary and Critical Care [phone] or if no answer [phone]

## 2012-08-20 NOTE — Progress Notes (Signed)
Subjective: Patient reports Feels much better less headache better movement in his arms and legs no nausea  Objective: Vital signs in last 24 hours: Temp:  [97.1 F (36.2 C)-98.3 F (36.8 C)] 98.3 F (36.8 C) (05/29 0400) Pulse Rate:  [56-70] 68 (05/29 0900) Resp:  [15-22] 22 (05/29 0900) BP: (125-165)/(41-81) 165/81 mmHg (05/29 0900) SpO2:  [92 %-100 %] 96 % (05/29 0900) Arterial Line BP: (133-187)/(43-62) 151/50 mmHg (05/29 0900) Weight:  [120.1 kg (264 lb 12.4 oz)] 120.1 kg (264 lb 12.4 oz) (05/28 2200)  Intake/Output from previous day: 05/28 0701 - 05/29 0700 In: 2373.8 [I.V.:2218.8; IV Piggyback:155] Out: 1560 [Urine:1410; Blood:150] Intake/Output this shift: Total I/O In: 0  Out: 500 [Urine:500]  Awake alert oriented x3 strength 5 out of 5 no pronator drift this point significantly improved left upper to strength  Lab Results:  Recent Labs  08/19/12 2200  WBC 8.5  HGB 10.9*  HCT 33.2*  PLT 148*   BMET  Recent Labs  08/19/12 2200 08/20/12 0825  NA 136 133*  K 5.2* 6.2*  CL 106 105  CO2 20 18*  GLUCOSE 156* 160*  BUN 42* 38*  CREATININE 1.63* 1.63*  CALCIUM 8.9 9.1    Studies/Results: Ct Head Wo Contrast  08/20/2012   *RADIOLOGY REPORT*  Clinical Data: Follow-up subdural hemorrhage  CT HEAD WITHOUT CONTRAST  Technique:  Contiguous axial images were obtained from the base of the skull through the vertex without contrast.  Comparison: CT 08/11/2012  Findings: Interval right-sided craniotomy for subdural drainage. Subdural drain in satisfactory position.  Mixed density subdural hematoma measures 16 mm which is improved from the prior study. There is less high density blood in the subdural space compared with the prior study.  There is mild to moderate subdural gas present on the right.  There is mass effect on the right cerebral hemisphere with 11 mm midline shift which is unchanged.  No hydrocephalus.  No acute infarct or mass.  No new hemorrhage is seen.   IMPRESSION: Subdural drainage on the right.  There is mild improvement in subdural hematoma.  There is no change in midline shift which measures 11 mm to the left.   Original Report Authenticated By: Janeece Riggers, M.D.    Assessment/Plan: Posterior day 1 craniotomy for evacuation of a right subdural hematoma patient recovered very well mobilized day take out as a Physical outpatient therapy drain output minimal but will continue 1 more day followup CT scan significantly improved just pneumocephalus remains.  LOS: 1 day     Shiniqua Groseclose P 08/20/2012, 9:32 AM

## 2012-08-20 NOTE — Progress Notes (Signed)
UR completed 

## 2012-08-20 NOTE — Clinical Documentation Improvement (Signed)
CKD DOCUMENTATION CLARIFICATION QUERY   THIS DOCUMENT IS NOT A PERMANENT PART OF THE MEDICAL RECORD  Please update your documentation within the medical record to reflect your response to this query.                                                                                        08/20/12   Dear Dr.Salimata Christenson,  In a better effort to capture your patient's severity of illness, reflect appropriate length of stay and utilization of resources, a review of the patient medical record has revealed the following indicators.   Based on your clinical judgment, please clarify and document in a progress note and/or discharge summary the clinical condition associated with the following supporting information: In responding to this query please exercise your independent judgment.  The fact that a query is asked, does not imply that any particular answer is desired or expected.   Austin Salas was admitted with SDH. Per progress notes: "CKD, baseline Cr 1.8 according to OSH labs". If possible, please help by clarifying the stage of CKD in the progress notes. Thank you!  Possible Clinical Conditions?  - CKD Stage II - GFR 60-89, Cr 1.0-1.3  - CKD Stage III - GFR 30-59, Cr 1.4-2.5  - CKD Stage IV - GFR 15-29, Cr 2.5-4.5  - Other condition (please document in the progress notes and/or discharge summary)  - Cannot Clinically determine at this time   Supporting Information:  Component Creatinine GFR calc non Af Amer  Latest Ref Rng 0.50 - 1.35 mg/dL >09 mL/min  11/02/9145 8.29 (H) 41 (L)  08/20/2012 1.63 (H) 40 (L)   Component GFR calc Af Amer  Latest Ref Rng >90 mL/min  08/19/2012 47 (L)  08/20/2012 47 (L)      Reviewed: additional documentation in the medical record   Thank You,  Saul Fordyce  Clinical Documentation Specialist: Office [phone]  Health Information Management [place]

## 2012-08-21 ENCOUNTER — Encounter (HOSPITAL_COMMUNITY): Payer: Self-pay | Admitting: Neurosurgery

## 2012-08-21 DIAGNOSIS — E875 Hyperkalemia: Secondary | ICD-10-CM | POA: Diagnosis present

## 2012-08-21 LAB — BASIC METABOLIC PANEL
BUN: 37 mg/dL — ABNORMAL HIGH (ref 6–23)
CO2: 21 mEq/L (ref 19–32)
CO2: 22 mEq/L (ref 19–32)
Calcium: 9.2 mg/dL (ref 8.4–10.5)
Chloride: 103 mEq/L (ref 96–112)
Creatinine, Ser: 1.71 mg/dL — ABNORMAL HIGH (ref 0.50–1.35)
Creatinine, Ser: 1.49 mg/dL — ABNORMAL HIGH (ref 0.50–1.35)
GFR calc non Af Amer: 45 mL/min — ABNORMAL LOW (ref >90)
Glucose, Bld: 183 mg/dL — ABNORMAL HIGH (ref 70–99)
Sodium: 136 mEq/L (ref 135–145)

## 2012-08-21 LAB — CBC
HCT: 36.2 % — ABNORMAL LOW (ref 39.0–52.0)
Hemoglobin: 11.9 g/dL — ABNORMAL LOW (ref 13.0–17.0)
MCHC: 32.9 g/dL (ref 30.0–36.0)
MCV: 89.6 fL (ref 78.0–100.0)
RDW: 14.1 % (ref 11.5–15.5)
WBC: 11.6 10*3/uL — ABNORMAL HIGH (ref 4.0–10.5)

## 2012-08-21 LAB — GLUCOSE, CAPILLARY
Glucose-Capillary: 160 mg/dL — ABNORMAL HIGH (ref 70–99)
Glucose-Capillary: 150 mg/dL — ABNORMAL HIGH (ref 70–99)

## 2012-08-21 LAB — NA AND K (SODIUM & POTASSIUM), RAND UR
Potassium Urine: 48 mEq/L
Sodium, Ur: 67 mEq/L

## 2012-08-21 NOTE — Progress Notes (Addendum)
PULMONARY  / CRITICAL CARE MEDICINE  Name: Austin Salas MRN: [id number] DOB: 10-Dec-1940    ADMISSION DATE:  08/19/2012 CONSULTATION DATE:  08/19/2012  REFERRING MD :  Wynetta Emery PRIMARY SERVICE: NSGY  CHIEF COMPLAINT:  Medical management SDH  BRIEF PATIENT DESCRIPTION: 72 y/o male with hypertension and DM2 was admitted on 5/28 for evacuation of a Subdural Hematoma.  PCCM consulted for medical management.  SIGNIFICANT EVENTS / STUDIES:  5/28 CT head (OSH) > large frontal R SDH with 11mm midline shift 5/28 craniotomy for evacuation of subacute R SDH 5/29 CT head >> mild improvement SDH, no change 11 mm leftward shift  LINES / TUBES: 5/28 R radial a-line > 5/29 5/28 R intracranial drain >> 5/29  CULTURES: MRSA screen 5/28 >> negative  ANTIBIOTICS: Cefazolin 5/28 post op x 2  HISTORY OF PRESENT ILLNESS:  72 y/o male with hypertension and DM2 was admitted on 5/28 for evacuation of a Subdural Hematoma.  PCCM consulted for medical management.  He did not recall a fall in the weeks preceding 5/28, but he went to the [facility] ED that day because of headaches, confusion, left arm weakness, and leg weakness.  He was found to have a large sub-dural so he was transferred to [facility] for a craniotomy by NSGY.  He tolerated the procedure well without complication, was extubated, and then transferred to 3100 for further management.  On my arrival he had a slight headache but was feeling well otherwise.  He did not have weakness or slurred speech.  SUBJECTIVE:  Doing well, J-P drain out this am Treated for hyperkalemia 5/29 with kayexcelate, insulin/d50  VITAL SIGNS: Temp:  [98.9 F (37.2 C)-100.7 F (38.2 C)] 98.9 F (37.2 C) (05/30 0800) Pulse Rate:  [60-111] 69 (05/30 0800) Resp:  [16-25] 20 (05/30 0800) BP: (130-185)/(37-99) 130/99 mmHg (05/30 0800) SpO2:  [95 %-100 %] 99 % (05/30 0800) Arterial Line BP: (153-199)/(44-63) 199/63 mmHg (05/29 1400) HEMODYNAMICS:   VENTILATOR SETTINGS:    INTAKE / OUTPUT: Intake/Output     05/29 0701 - 05/30 0700 05/30 0701 - 05/31 0700   P.O. 720 240   I.V. (mL/kg) 200 (1.7) 300 (2.5)   IV Piggyback 310    Total Intake(mL/kg) 1230 (10.2) 540 (4.5)   Urine (mL/kg/hr) 1726 (0.6)    Blood     Total Output 1726     Net -496 +540        Urine Occurrence 1 x 300 x     PHYSICAL EXAMINATION:  Gen: well appearing, no acute distress HEENT: head dressing in place, PERRL, EOMi, OP clear, neck supple without masses PULM: CTA B CV: RRR, no mgr, no JVD AB: BS+, soft, nontender, no hsm Ext: warm, no edema, no clubbing, no cyanosis Derm: no rash or skin breakdown Neuro: A&Ox4, CN II-XII intact, MAEW   LABS:  Recent Labs Lab 08/19/12 2200 08/20/12 0825 08/20/12 1600 08/21/12 0443  HGB 10.9*  --   --  11.9*  WBC 8.5  --   --  11.6*  PLT 148*  --   --  145*  NA 136 133* 135 136  K 5.2* 6.2* 5.0 5.3*  CL 106 105 103 103  CO2 20 18* 19 21  GLUCOSE 156* 160* 194* 187*  BUN 42* 38* 37* 37*  CREATININE 1.63* 1.63* 1.70* 1.71*  CALCIUM 8.9 9.1 9.3 9.1    Recent Labs Lab 08/20/12 0809 08/20/12 1109 08/20/12 1743 08/20/12 2232 08/21/12 0758  GLUCAP 125* 180* 144* 148* 160*  CXR:   ASSESSMENT / PLAN:  NEUROLOGIC A:  Subacute Subdural hematoma with midline shift> resolved post craniotomy and evacuation P:   -post op care per NSGY -keppra per NSGY  PULMONARY A: No acute issues P:   -monitor O2 saturation -pulm toilet  CARDIOVASCULAR A: Hypertension P:  -labetalol prn SBP >160 -hold home BP meds and crestor until we sort out his hyperkalemia issue -tele  RENAL A:  CKD Stage 3, baseline Cr 1.8 according to OSH labs Hyperkalemia, K 5.2 > 6.2 > 5.0 > 5.3; no obvious medications that could be cause, ? Due to brain trauma. Only new meds were Zocor (on Crestor at home) and Ancef; both stopped P:   -follow BMP -changed IVF from d5 1/2ns + KCl to NS at [facility] on 5/29 -stop zocor and check LDH, total CPK -check urine  Na, K, Cr to assess K excretion -monitor UOP  GASTROINTESTINAL A:  No acute issues P:   -advance diet as tolerated  HEMATOLOGIC A:  Normocytic anemia without active bleeding, anemia of CKD?[name] Ferritin 456 P:  -cbc in am  INFECTIOUS A:  No acute issues P:  -monitor fever curve  ENDOCRINE A:  DM2 P:   -SSI  TODAY'S SUMMARY:72 y/o male with HTN, DM2, was admitted on 5/28 for evacuation of a R subdural hematoma causing midline shift.  Doing well post op. Note hyperkalemia. Deferred restart BP and DM meds pending further eval of K+.    Levy Pupa S.,MD Pulmonary and Critical Care Medicine [facility] Pager: [phone]  08/21/2012, 10:26 AM

## 2012-08-21 NOTE — Progress Notes (Signed)
Subjective: Patient reports No complaints minimal headache no numbness or tingling in his arms or his legs feel like he has better movement on the left side  Objective: Vital signs in last 24 hours: Temp:  [99.8 F (37.7 C)-100.7 F (38.2 C)] 100.2 F (37.9 C) (05/30 0400) Pulse Rate:  [60-111] 66 (05/30 0700) Resp:  [16-25] 17 (05/30 0700) BP: (131-185)/(37-81) 165/56 mmHg (05/30 0700) SpO2:  [95 %-100 %] 98 % (05/30 0700) Arterial Line BP: (133-199)/(44-63) 199/63 mmHg (05/29 1400)  Intake/Output from previous day: 05/29 0701 - 05/30 0700 In: 1230 [P.O.:720; I.V.:200; IV Piggyback:310] Out: 1726 [Urine:1726] Intake/Output this shift:    Awake alert oriented x3 strength is 5 out of 5 no pronator drift wound clean and dry  Lab Results:  Recent Labs  08/19/12 2200 08/21/12 0443  WBC 8.5 11.6*  HGB 10.9* 11.9*  HCT 33.2* 36.2*  PLT 148* 145*   BMET  Recent Labs  08/20/12 1600 08/21/12 0443  NA 135 136  K 5.0 5.3*  CL 103 103  CO2 19 21  GLUCOSE 194* 187*  BUN 37* 37*  CREATININE 1.70* 1.71*  CALCIUM 9.3 9.1    Studies/Results: Ct Head Wo Contrast  08/20/2012   *RADIOLOGY REPORT*  Clinical Data: Follow-up subdural hemorrhage  CT HEAD WITHOUT CONTRAST  Technique:  Contiguous axial images were obtained from the base of the skull through the vertex without contrast.  Comparison: CT 08/11/2012  Findings: Interval right-sided craniotomy for subdural drainage. Subdural drain in satisfactory position.  Mixed density subdural hematoma measures 16 mm which is improved from the prior study. There is less high density blood in the subdural space compared with the prior study.  There is mild to moderate subdural gas present on the right.  There is mass effect on the right cerebral hemisphere with 11 mm midline shift which is unchanged.  No hydrocephalus.  No acute infarct or mass.  No new hemorrhage is seen.  IMPRESSION: Subdural drainage on the right.  There is mild improvement  in subdural hematoma.  There is no change in midline shift which measures 11 mm to the left.   Original Report Authenticated By: Janeece Riggers, M.D.    Assessment/Plan: Posterior they 2 craniotomy for right-sided subdural hematoma patient doing well CT head without minimal to no drainage I discontinued her J-P drain placed. We'll continue to mobilize with physical outpatient therapy we'll consider transfer the floor later today  LOS: 2 days     Yanel Dombrosky P 08/21/2012, 7:25 AM

## 2012-08-21 NOTE — Progress Notes (Signed)
Physical Therapy Treatment Patient Details Name: Austin Salas MRN: [id number] DOB: 09-22-1940 Today's Date: 08/21/2012 Time: 7829-5621 PT Time Calculation (min): 24 min  PT Assessment / Plan / Recommendation Comments on Treatment Session  Patient having great difficulty with sit to stand, with posterior lean and difficulty keeping knees flexed.  Required +2 assist to get to standing.  Once upright, patient reports legs feel weak.  At this point, do not feel that patient's wife can provide physical assist needed for patient to be at home safely.  Patient would need to achieve supervision level.  Recommend Inpatient Rehab consult for comprehensive therapies prior to return home with wife.    Follow Up Recommendations  CIR;Supervision/Assistance - 24 hour     Does the patient have the potential to tolerate intense rehabilitation     Barriers to Discharge        Equipment Recommendations  Rolling walker with 5" wheels    Recommendations for Other Services Rehab consult  Frequency Min 3X/week   Plan Discharge plan needs to be updated;Frequency remains appropriate    Precautions / Restrictions Precautions Precautions: Fall Restrictions Weight Bearing Restrictions: No   Pertinent Vitals/Pain     Mobility  Bed Mobility Bed Mobility: Not assessed Transfers Transfers: Sit to Stand;Stand to Sit Sit to Stand: 1: +2 Total assist;With upper extremity assist;With armrests;From chair/3-in-1 Sit to Stand: Patient Percentage: 60% Stand to Sit: 3: Mod assist;With upper extremity assist;With armrests;To chair/3-in-1 Details for Transfer Assistance: Attempted x3 to move sit to stand from low recliner.  Patient unable to translate weight forward over feet.  Required +2 to assist sit to stand, assisting patient to bring weight forward over feet, and bending knees to get feet underneath him.  Patient required mod assist to sit into chair, assist to control descent to  chair. Ambulation/Gait Ambulation/Gait Assistance: 4: Min assist (+1 pushing chair behind patient) Ambulation Distance (Feet): 160 Feet Assistive device: Rolling walker Ambulation/Gait Assistance Details: Verbal cues to stand upright and look forward during gait.  Cues for safe use of RW, especially during turns to keep feet inside RW. Gait Pattern: Step-through pattern;Decreased stride length;Ataxic;Trunk flexed;Wide base of support Gait velocity: Slow gait speed      PT Goals Acute Rehab PT Goals Pt will go Sit to Stand: with modified independence PT Goal: Sit to Stand - Progress: Not progressing (continues to require +2 assist to move to standing) Pt will Ambulate: >150 feet;with rolling walker;with modified independence PT Goal: Ambulate - Progress: Progressing toward goal  Visit Information  Last PT Received On: 08/21/12 Assistance Needed: +2    Subjective Data  Subjective: "I'm trying to figure out what happened"  "My legs feel weak"   Cognition  Cognition Arousal/Alertness: Awake/alert Behavior During Therapy: WFL for tasks assessed/performed Overall Cognitive Status: Within Functional Limits for tasks assessed    Balance  Balance Balance Assessed: Yes Dynamic Sitting Balance Dynamic Sitting - Balance Support: Bilateral upper extremity supported;Feet supported Dynamic Sitting - Level of Assistance: 4: Min assist Dynamic Sitting - Balance Activities: Forward lean/weight shifting;Trunk control activities Dynamic Sitting - Comments: Worked in sitting on moving trunk forward over feet to initiate sit to stand.  Patient with posterior lean, and difficulty keeping knees flexed with feet underneath himself.  Difficulty to get trunk forward over feet.  End of Session PT - End of Session Equipment Utilized During Treatment: Gait belt Activity Tolerance: Patient limited by fatigue Patient left: in chair;with call bell/phone within reach;with nursing in room Nurse  Communication: Mobility  status   GP     Vena Austria 08/21/2012, 5:06 PM Durenda Hurt. Renaldo Fiddler, [facility] Acute Rehab Services Pager [phone]

## 2012-08-22 LAB — CBC
HCT: 37.4 % — ABNORMAL LOW (ref 39.0–52.0)
Hemoglobin: 12.4 g/dL — ABNORMAL LOW (ref 13.0–17.0)
MCV: 89.7 fL (ref 78.0–100.0)
WBC: 10 10*3/uL (ref 4.0–10.5)

## 2012-08-22 LAB — GLUCOSE, CAPILLARY
Glucose-Capillary: 148 mg/dL — ABNORMAL HIGH (ref 70–99)
Glucose-Capillary: 233 mg/dL — ABNORMAL HIGH (ref 70–99)

## 2012-08-22 LAB — BASIC METABOLIC PANEL
BUN: 29 mg/dL — ABNORMAL HIGH (ref 6–23)
CO2: 22 mEq/L (ref 19–32)
Glucose, Bld: 163 mg/dL — ABNORMAL HIGH (ref 70–99)
Potassium: 5 mEq/L (ref 3.5–5.1)
Sodium: 134 mEq/L — ABNORMAL LOW (ref 135–145)

## 2012-08-22 LAB — PHOSPHORUS: Phosphorus: 2.5 mg/dL (ref 2.3–4.6)

## 2012-08-22 MED ORDER — IRBESARTAN 300 MG PO TABS
300.0000 mg | ORAL_TABLET | Freq: Every day | ORAL | Status: DC
  Administered 2012-08-22 – 2012-09-03 (×13): 300 mg via ORAL
  Filled 2012-08-22 (×13): qty 1

## 2012-08-22 MED ORDER — LEVETIRACETAM ER 500 MG PO TB24
500.0000 mg | ORAL_TABLET | Freq: Two times a day (BID) | ORAL | Status: DC
  Administered 2012-08-22 – 2012-08-23 (×4): 500 mg via ORAL
  Filled 2012-08-22 (×6): qty 1

## 2012-08-22 MED ORDER — PANTOPRAZOLE SODIUM 40 MG PO TBEC
40.0000 mg | DELAYED_RELEASE_TABLET | Freq: Every day | ORAL | Status: DC
  Administered 2012-08-22 – 2012-09-03 (×14): 40 mg via ORAL
  Filled 2012-08-22 (×14): qty 1

## 2012-08-22 MED ORDER — HYDROCHLOROTHIAZIDE 25 MG PO TABS
25.0000 mg | ORAL_TABLET | Freq: Every day | ORAL | Status: DC
  Administered 2012-08-22 – 2012-09-03 (×13): 25 mg via ORAL
  Filled 2012-08-22 (×14): qty 1

## 2012-08-22 MED ORDER — VALSARTAN-HYDROCHLOROTHIAZIDE 320-25 MG PO TABS: 1.0000 | ORAL_TABLET | Freq: Every day | ORAL | Status: DC

## 2012-08-22 NOTE — Progress Notes (Signed)
Subjective: Patient sitting up, eating breakfast. Oriented to name and hospital, but not to year.  Objective: Vital signs in last 24 hours: Filed Vitals:   08/22/12 0500 08/22/12 0600 08/22/12 0700 08/22/12 0800  BP: 172/59 170/69 152/55 144/71  Pulse:      Temp:    98.4 F (36.9 C)  TempSrc:    Oral  Resp: 17 16 14 16   Height:      Weight:      SpO2:        Intake/Output from previous day: 05/30 0701 - 05/31 0700 In: 1670 [P.O.:720; I.V.:740; IV Piggyback:210] Out: 950 [Urine:950] Intake/Output this shift: Total I/O In: 20 [I.V.:20] Out: 250 [Urine:250]  Physical Exam:  Awake alert, following commands. Dressing clean and dry.  CBC  Recent Labs  08/21/12 0443 08/22/12 0614  WBC 11.6* 10.0  HGB 11.9* 12.4*  HCT 36.2* 37.4*  PLT 145* 136*   BMET  Recent Labs  08/21/12 1609 08/22/12 0614  NA 136 134*  K 4.9 5.0  CL 102 100  CO2 22 22  GLUCOSE 183* 163*  BUN 33* 29*  CREATININE 1.49* 1.41*  CALCIUM 9.2 9.3    Assessment/Plan: Continuing to recover. We'll change Protonix and Keppra to by mouth, per nursing request.     Hewitt Shorts, MD 08/22/2012, 8:59 AM

## 2012-08-22 NOTE — Progress Notes (Signed)
Pt's EKG faxed to [facility] earlier. MD (CCM) notified and no new orders given.  Will continue to monitor.

## 2012-08-22 NOTE — Progress Notes (Signed)
PULMONARY  / CRITICAL CARE MEDICINE  Name: Austin Salas MRN: [id number] DOB: 06/30/1940    ADMISSION DATE:  08/19/2012 CONSULTATION DATE:  08/19/2012  REFERRING MD :  Wynetta Emery PRIMARY SERVICE: NSGY  CHIEF COMPLAINT:  Medical management SDH  BRIEF PATIENT DESCRIPTION: 72 y/o male with hypertension and DM2 was admitted on 5/28 for evacuation of a Subdural Hematoma.  PCCM consulted for medical management.  SIGNIFICANT EVENTS / STUDIES:  5/28 CT head (OSH) > large frontal R SDH with 11mm midline shift 5/28 craniotomy for evacuation of subacute R SDH 5/29 CT head >> mild improvement SDH, no change 11 mm leftward shift  LINES / TUBES: 5/28 R radial a-line > 5/29 5/28 R intracranial drain >> 5/29  CULTURES: MRSA screen 5/28 >> negative  ANTIBIOTICS: Cefazolin 5/28 post op x 2  SUBJECTIVE:  No c/o.  Ate breakfast this am.  Drain out.    VITAL SIGNS: Temp:  [98.4 F (36.9 C)-99.1 F (37.3 C)] 98.4 F (36.9 C) (05/31 0800) Pulse Rate:  [57-117] 62 (05/31 1200) Resp:  [12-19] 14 (05/31 1200) BP: (129-179)/(48-111) 168/61 mmHg (05/31 1200) SpO2:  [94 %-100 %] 98 % (05/31 1200) HEMODYNAMICS:     INTAKE / OUTPUT: Intake/Output     05/30 0701 - 05/31 0700 05/31 0701 - 06/01 0700   P.O. 720 160   I.V. (mL/kg) 740 (6.2) 60 (0.5)   IV Piggyback 210    Total Intake(mL/kg) 1670 (13.9) 220 (1.8)   Urine (mL/kg/hr) 950 (0.3) 550 (0.8)   Total Output 950 550   Net +720 -330        Urine Occurrence 303 x      PHYSICAL EXAMINATION:  Gen: well appearing, no acute distress HEENT: head dressing in place, PERRL, EOMi, OP clear, neck supple without masses PULM: CTA B CV: RRR, no mgr, no JVD AB: BS+, soft, nontender, no hsm Ext: warm, no edema, no clubbing, no cyanosis Derm: no rash or skin breakdown Neuro: A&Ox4, CN II-XII intact, MAEW   LABS:  Recent Labs Lab 08/19/12 2200  08/21/12 0443 08/21/12 1609 08/22/12 0614  HGB 10.9*  --  11.9*  --  12.4*  WBC 8.5  --  11.6*   --  10.0  PLT 148*  --  145*  --  136*  NA 136  < > 136 136 134*  K 5.2*  < > 5.3* 4.9 5.0  CL 106  < > 103 102 100  CO2 20  < > 21 22 22   GLUCOSE 156*  < > 187* 183* 163*  BUN 42*  < > 37* 33* 29*  CREATININE 1.63*  < > 1.71* 1.49* 1.41*  CALCIUM 8.9  < > 9.1 9.2 9.3  MG  --   --   --   --  1.6  PHOS  --   --   --   --  2.5  < > = values in this interval not displayed.  Recent Labs Lab 08/21/12 0758 08/21/12 1207 08/21/12 1703 08/21/12 2221 08/22/12 0758  GLUCAP 160* 128* 149* 150* 148*    CXR:   ASSESSMENT / PLAN:  NEUROLOGIC A:  Subacute Subdural hematoma with midline shift> resolved post craniotomy and evacuation P:   -post op care per NSGY -keppra per NSGY  PULMONARY A: No acute issues P:   -monitor O2 saturation -pulm toilet  CARDIOVASCULAR A: Hypertension P:  -labetalol prn SBP >160 -Cont hold crestor  -slowly resume home anti-htn meds and watch K -tele  RENAL A:  CKD Stage 3, baseline Cr 1.8 according to OSH labs Hyperkalemia, K 5.2 > 6.2 > 5.0 > 5.3; no obvious medications that could be cause, ? Due to brain trauma. Only new meds were Zocor (on Crestor at home) and Ancef; both stopped P:   -follow BMP -monitor UOP  GASTROINTESTINAL A:  No acute issues P:   -advance diet as tolerated  HEMATOLOGIC A:  Normocytic anemia without active bleeding, anemia of CKD?[name] Ferritin 456 P:  -cbc in am  INFECTIOUS A:  No acute issues P:  -monitor fever curve  ENDOCRINE A:  DM2 P:   -SSI  TODAY'S SUMMARY:72 y/o male with HTN, DM2, was admitted on 5/28 for evacuation of a R subdural hematoma causing midline shift.  Doing well post op. Note hyperkalemia. Deferred restart BP and DM meds pending further eval of K+ - will restart slowly 5/31 with K stable.  Cont monitor closely.    Danford Bad, NP 08/22/2012  12:59 PM Pager: [phone] or [phone]  *Care during the described time interval was provided by me and/or other providers on  the critical care team. I have reviewed this patient's available data, including medical history, events of note, physical examination and test results as part of my evaluation.   Patient seen and examined, agree with above note.  I dictated the care and orders written for this patient under my direction.  Alyson Reedy, MD [phone]

## 2012-08-22 NOTE — Progress Notes (Signed)
Pt noted to have elevated T waves on his rhythm strip.  MD (CCM) notified and order given to perform an EKG.  Pt has no complaints of pain or discomfort.  Will continue to monitor.

## 2012-08-23 DIAGNOSIS — E875 Hyperkalemia: Secondary | ICD-10-CM

## 2012-08-23 LAB — GLUCOSE, CAPILLARY
Glucose-Capillary: 157 mg/dL — ABNORMAL HIGH (ref 70–99)
Glucose-Capillary: 142 mg/dL — ABNORMAL HIGH (ref 70–99)
Glucose-Capillary: 129 mg/dL — ABNORMAL HIGH (ref 70–99)
Glucose-Capillary: 155 mg/dL — ABNORMAL HIGH (ref 70–99)

## 2012-08-23 LAB — CBC
MCH: 29.5 pg (ref 26.0–34.0)
MCHC: 33.2 g/dL (ref 30.0–36.0)
MCV: 88.9 fL (ref 78.0–100.0)
Platelets: 159 10*3/uL (ref 150–400)
RDW: 13.7 % (ref 11.5–15.5)

## 2012-08-23 LAB — BASIC METABOLIC PANEL
CO2: 23 mEq/L (ref 19–32)
Calcium: 9.7 mg/dL (ref 8.4–10.5)
Creatinine, Ser: 1.37 mg/dL — ABNORMAL HIGH (ref 0.50–1.35)
GFR calc non Af Amer: 50 mL/min — ABNORMAL LOW (ref >90)

## 2012-08-23 MED ORDER — AMLODIPINE BESYLATE 5 MG PO TABS
5.0000 mg | ORAL_TABLET | Freq: Every day | ORAL | Status: DC
  Administered 2012-08-23 – 2012-09-03 (×12): 5 mg via ORAL
  Filled 2012-08-23 (×12): qty 1

## 2012-08-23 MED ORDER — BIOTENE DRY MOUTH MT LIQD
15.0000 mL | Freq: Two times a day (BID) | OROMUCOSAL | Status: DC
  Administered 2012-08-24 – 2012-08-27 (×7): 15 mL via OROMUCOSAL

## 2012-08-23 NOTE — Progress Notes (Signed)
Subjective: Patient resting in bed comfortably, without complaints.  Objective: Vital signs in last 24 hours: Filed Vitals:   08/23/12 0530 08/23/12 0600 08/23/12 0700 08/23/12 0800  BP: 133/62 147/54 147/50 165/61  Pulse: 58 54 84 82  Temp:    98.2 F (36.8 C)  TempSrc:    Oral  Resp: 16 16 14 14   Height:      Weight:      SpO2:  99% 99% 97%    Intake/Output from previous day: 05/31 0701 - 06/01 0700 In: 790 [P.O.:730; I.V.:60] Out: 1175 [Urine:1175] Intake/Output this shift:    Physical Exam:  Awake and alert, following commands. Dressing clean and dry.  CBC  Recent Labs  08/22/12 0614 08/23/12 0447  WBC 10.0 11.0*  HGB 12.4* 12.5*  HCT 37.4* 37.7*  PLT 136* 159   BMET  Recent Labs  08/22/12 0614 08/23/12 0447  NA 134* 134*  K 5.0 4.6  CL 100 97  CO2 22 23  GLUCOSE 163* 160*  BUN 29* 29*  CREATININE 1.41* 1.37*  CALCIUM 9.3 9.7    Assessment/Plan: We'll check CT brain without and a.m. for followup. We'll check BMET in a.m. to monitor sodium.   Hewitt Shorts, MD 08/23/2012, 8:22 AM

## 2012-08-23 NOTE — Progress Notes (Signed)
PULMONARY  / CRITICAL CARE MEDICINE  Name: Austin Salas MRN: [id number] DOB: 04-17-1940    ADMISSION DATE:  08/19/2012 CONSULTATION DATE:  08/19/2012  REFERRING MD :  Wynetta Emery PRIMARY SERVICE: NSGY  CHIEF COMPLAINT:  Medical management SDH  BRIEF PATIENT DESCRIPTION: 72 y/o male with hypertension and DM2 was admitted on 5/28 for evacuation of a Subdural Hematoma.  PCCM consulted for medical management.  SIGNIFICANT EVENTS / STUDIES:  5/28 CT head (OSH) > large frontal R SDH with 11mm midline shift 5/28 craniotomy for evacuation of subacute R SDH 5/29 CT head >> mild improvement SDH, no change 11 mm leftward shift  LINES / TUBES: 5/28 R radial a-line > 5/29 5/28 R intracranial drain >> 5/29  CULTURES: MRSA screen 5/28 >> negative  ANTIBIOTICS: Cefazolin 5/28 post op x 2  SUBJECTIVE:  No change.  No c/o.    VITAL SIGNS: Temp:  [98.2 F (36.8 C)-98.9 F (37.2 C)] 98.2 F (36.8 C) (06/01 0800) Pulse Rate:  [54-84] 82 (06/01 0800) Resp:  [12-20] 12 (06/01 1008) BP: (125-173)/(50-101) 132/59 mmHg (06/01 1008) SpO2:  [97 %-100 %] 97 % (06/01 0800) Weight:  [262 lb 5.6 oz (119 kg)] 262 lb 5.6 oz (119 kg) (06/01 0300) HEMODYNAMICS:     INTAKE / OUTPUT: Intake/Output     05/31 0701 - 06/01 0700 06/01 0701 - 06/02 0700   P.O. 730 240   I.V. (mL/kg) 60 (0.5)    IV Piggyback     Total Intake(mL/kg) 790 (6.6) 240 (2)   Urine (mL/kg/hr) 1175 (0.4)    Total Output 1175     Net -385 +240        Urine Occurrence 4 x      PHYSICAL EXAMINATION:  Gen: well appearing, no acute distress, OOB in chair  HEENT: head dressing in place, PERRL, EOMi, OP clear, neck supple without masses PULM: CTA B CV: RRR, no mgr, no JVD AB: BS+, soft, nontender, no hsm Ext: warm, no edema, no clubbing, no cyanosis Derm: no rash or skin breakdown Neuro: A&Ox4, CN II-XII intact, MAEW   LABS:  Recent Labs Lab 08/21/12 0443 08/21/12 1609 08/22/12 0614 08/23/12 0447  HGB 11.9*  --  12.4*  12.5*  WBC 11.6*  --  10.0 11.0*  PLT 145*  --  136* 159  NA 136 136 134* 134*  K 5.3* 4.9 5.0 4.6  CL 103 102 100 97  CO2 21 22 22 23   GLUCOSE 187* 183* 163* 160*  BUN 37* 33* 29* 29*  CREATININE 1.71* 1.49* 1.41* 1.37*  CALCIUM 9.1 9.2 9.3 9.7  MG  --   --  1.6  --   PHOS  --   --  2.5  --     Recent Labs Lab 08/22/12 0758 08/22/12 1255 08/22/12 1743 08/22/12 2158 08/23/12 0803  GLUCAP 148* 153* 233* 155* 157*    CXR:  No results found.   ASSESSMENT / PLAN:  NEUROLOGIC A:  Subacute Subdural hematoma with midline shift> resolved post craniotomy and evacuation P:   -post op care per NSGY -keppra per NSGY  PULMONARY A: No acute issues P:   -monitor O2 saturation -pulm toilet  CARDIOVASCULAR A: Hypertension P:  -labetalol prn SBP >160 -Cont hold crestor  -slowly resume home anti-htn meds and watch K -tele  RENAL A:  CKD Stage 3- baseline Cr 1.8 according to OSH labs Hyperkalemia - improved. K 5.2 > 6.2 > 5.0 > 5.3; no obvious medications that could be cause, ?  Due to brain trauma. Only new meds were Zocor (on Crestor at home) and Ancef; both stopped P:   -follow BMP -monitor UOP  GASTROINTESTINAL A:  No acute issues P:   -advance diet as tolerated  HEMATOLOGIC A:  Normocytic anemia without active bleeding, anemia of CKD?[name] Ferritin 456 P:  -cbc in am  INFECTIOUS A:  No acute issues P:  -monitor fever curve  ENDOCRINE A:  DM2 P:   -SSI   WHITEHEART,KATHRYN, NP 08/23/2012  11:22 AM Pager: [phone] or [phone]  *Care during the described time interval was provided by me and/or other providers on the critical care team. I have reviewed this patient's available data, including medical history, events of note, physical examination and test results as part of my evaluation.  Patient seen and examined, agree with above note.  I dictated the care and orders written for this patient under my direction.  Alyson Reedy, MD [phone]

## 2012-08-24 ENCOUNTER — Inpatient Hospital Stay (HOSPITAL_COMMUNITY): Payer: BC Managed Care – PPO | Admitting: Anesthesiology

## 2012-08-24 ENCOUNTER — Encounter (HOSPITAL_COMMUNITY)
Admission: EM | Disposition: A | Discharge status: Home / Self Care | Payer: Self-pay | Source: Other Acute Inpatient Hospital | Attending: Neurosurgery

## 2012-08-24 ENCOUNTER — Encounter (HOSPITAL_COMMUNITY): Payer: Self-pay | Admitting: Anesthesiology

## 2012-08-24 ENCOUNTER — Encounter (HOSPITAL_COMMUNITY): Payer: Self-pay | Admitting: Radiology

## 2012-08-24 ENCOUNTER — Inpatient Hospital Stay (HOSPITAL_COMMUNITY): Payer: BC Managed Care – PPO

## 2012-08-24 DIAGNOSIS — J95821 Acute postprocedural respiratory failure: Secondary | ICD-10-CM

## 2012-08-24 HISTORY — PX: CRANIOTOMY: SHX93

## 2012-08-24 LAB — BASIC METABOLIC PANEL
BUN: 31 mg/dL — ABNORMAL HIGH (ref 6–23)
CO2: 24 mEq/L (ref 19–32)
Calcium: 9.8 mg/dL (ref 8.4–10.5)
Chloride: 96 mEq/L (ref 96–112)
Creatinine, Ser: 1.41 mg/dL — ABNORMAL HIGH (ref 0.50–1.35)
GFR calc Af Amer: 56 mL/min — ABNORMAL LOW (ref >90)
GFR calc non Af Amer: 48 mL/min — ABNORMAL LOW (ref >90)
Glucose, Bld: 168 mg/dL — ABNORMAL HIGH (ref 70–99)
Potassium: 5 mEq/L (ref 3.5–5.1)
Sodium: 135 mEq/L (ref 135–145)

## 2012-08-24 LAB — GLUCOSE, CAPILLARY
Glucose-Capillary: 156 mg/dL — ABNORMAL HIGH (ref 70–99)
Glucose-Capillary: 163 mg/dL — ABNORMAL HIGH (ref 70–99)
Glucose-Capillary: 135 mg/dL — ABNORMAL HIGH (ref 70–99)

## 2012-08-24 LAB — CBC
MCH: 29.5 pg (ref 26.0–34.0)
Platelets: 217 10*3/uL (ref 150–400)
RBC: 4.07 MIL/uL — ABNORMAL LOW (ref 4.22–5.81)
WBC: 8.9 10*3/uL (ref 4.0–10.5)

## 2012-08-24 SURGERY — CRANIOTOMY HEMATOMA EVACUATION SUBDURAL
Anesthesia: General | Site: Head | Wound class: Clean

## 2012-08-24 MED ORDER — ONDANSETRON HCL 4 MG PO TABS: 4.0000 mg | ORAL_TABLET | ORAL | Status: DC | PRN

## 2012-08-24 MED ORDER — FENTANYL CITRATE 0.05 MG/ML IJ SOLN
25.0000 ug | INTRAMUSCULAR | Status: DC | PRN
  Administered 2012-08-24 (×2): 25 ug via INTRAVENOUS

## 2012-08-24 MED ORDER — OXYCODONE HCL 5 MG/5ML PO SOLN: 5.0000 mg | Freq: Once | ORAL | Status: AC | PRN

## 2012-08-24 MED ORDER — PANTOPRAZOLE SODIUM 40 MG IV SOLR
40.0000 mg | Freq: Every day | INTRAVENOUS | Status: DC
  Filled 2012-08-24 (×2): qty 40

## 2012-08-24 MED ORDER — SUCCINYLCHOLINE CHLORIDE 20 MG/ML IJ SOLN
INTRAMUSCULAR | Status: DC | PRN
  Administered 2012-08-24: 120 mg via INTRAVENOUS

## 2012-08-24 MED ORDER — DOCUSATE SODIUM 100 MG PO CAPS
100.0000 mg | ORAL_CAPSULE | Freq: Two times a day (BID) | ORAL | Status: DC
  Filled 2012-08-24 (×2): qty 1

## 2012-08-24 MED ORDER — 0.9 % SODIUM CHLORIDE (POUR BTL) OPTIME
TOPICAL | Status: DC | PRN
  Administered 2012-08-24 (×2): 1000 mL

## 2012-08-24 MED ORDER — SODIUM CHLORIDE 0.9 % IR SOLN
Status: DC | PRN
  Administered 2012-08-24: 06:00:00

## 2012-08-24 MED ORDER — CEFAZOLIN SODIUM-DEXTROSE 2-3 GM-% IV SOLR
INTRAVENOUS | Status: DC | PRN
  Administered 2012-08-24: 2 g via INTRAVENOUS

## 2012-08-24 MED ORDER — LABETALOL HCL 5 MG/ML IV SOLN
10.0000 mg | INTRAVENOUS | Status: DC | PRN
  Administered 2012-08-26: 20 mg via INTRAVENOUS
  Filled 2012-08-24: qty 4

## 2012-08-24 MED ORDER — SODIUM CHLORIDE 0.9 % IV SOLN
500.0000 mg | Freq: Two times a day (BID) | INTRAVENOUS | Status: DC
  Filled 2012-08-24 (×2): qty 5

## 2012-08-24 MED ORDER — BISACODYL 10 MG RE SUPP: 10.0000 mg | Freq: Every day | RECTAL | Status: DC | PRN

## 2012-08-24 MED ORDER — SODIUM CHLORIDE 0.9 % IV SOLN
INTRAVENOUS | Status: AC
  Filled 2012-08-24: qty 500

## 2012-08-24 MED ORDER — ROCURONIUM BROMIDE 100 MG/10ML IV SOLN
INTRAVENOUS | Status: DC | PRN
  Administered 2012-08-24: 10 mg via INTRAVENOUS
  Administered 2012-08-24: 20 mg via INTRAVENOUS

## 2012-08-24 MED ORDER — LIDOCAINE HCL (CARDIAC) 20 MG/ML IV SOLN
INTRAVENOUS | Status: DC | PRN
  Administered 2012-08-24: 80 mg via INTRAVENOUS

## 2012-08-24 MED ORDER — HYDROCODONE-ACETAMINOPHEN 5-325 MG PO TABS
1.0000 | ORAL_TABLET | ORAL | Status: DC | PRN
  Administered 2012-08-26 – 2012-08-30 (×9): 1 via ORAL
  Filled 2012-08-24 (×10): qty 1

## 2012-08-24 MED ORDER — NEOSTIGMINE METHYLSULFATE 1 MG/ML IJ SOLN
INTRAMUSCULAR | Status: DC | PRN
  Administered 2012-08-24: 4 mg via INTRAVENOUS

## 2012-08-24 MED ORDER — BACITRACIN ZINC 500 UNIT/GM EX OINT
TOPICAL_OINTMENT | CUTANEOUS | Status: DC | PRN
  Administered 2012-08-24: 1 via TOPICAL

## 2012-08-24 MED ORDER — ONDANSETRON HCL 4 MG/2ML IJ SOLN: 4.0000 mg | INTRAMUSCULAR | Status: DC | PRN

## 2012-08-24 MED ORDER — FENTANYL CITRATE 0.05 MG/ML IJ SOLN
INTRAMUSCULAR | Status: AC
  Filled 2012-08-24: qty 2

## 2012-08-24 MED ORDER — LEVETIRACETAM 500 MG PO TABS
500.0000 mg | ORAL_TABLET | Freq: Two times a day (BID) | ORAL | Status: DC
  Administered 2012-08-24 – 2012-09-03 (×21): 500 mg via ORAL
  Filled 2012-08-24 (×24): qty 1

## 2012-08-24 MED ORDER — FLEET ENEMA 7-19 GM/118ML RE ENEM: 1.0000 | ENEMA | Freq: Once | RECTAL | Status: AC | PRN

## 2012-08-24 MED ORDER — SENNA 8.6 MG PO TABS
1.0000 | ORAL_TABLET | Freq: Two times a day (BID) | ORAL | Status: DC
  Administered 2012-08-24 – 2012-09-03 (×21): 8.6 mg via ORAL
  Filled 2012-08-24 (×24): qty 1

## 2012-08-24 MED ORDER — ONDANSETRON HCL 4 MG/2ML IJ SOLN
INTRAMUSCULAR | Status: DC | PRN
  Administered 2012-08-24: 4 mg via INTRAVENOUS

## 2012-08-24 MED ORDER — GLYCOPYRROLATE 0.2 MG/ML IJ SOLN
INTRAMUSCULAR | Status: DC | PRN
  Administered 2012-08-24: 0.6 mg via INTRAVENOUS

## 2012-08-24 MED ORDER — PHENYLEPHRINE HCL 10 MG/ML IJ SOLN
INTRAMUSCULAR | Status: DC | PRN
  Administered 2012-08-24: 80 ug via INTRAVENOUS

## 2012-08-24 MED ORDER — OXYCODONE HCL 5 MG PO TABS: 5.0000 mg | ORAL_TABLET | Freq: Once | ORAL | Status: AC | PRN

## 2012-08-24 MED ORDER — ONDANSETRON HCL 4 MG/2ML IJ SOLN: 4.0000 mg | Freq: Four times a day (QID) | INTRAMUSCULAR | Status: DC | PRN

## 2012-08-24 MED ORDER — THROMBIN 20000 UNITS EX KIT
PACK | CUTANEOUS | Status: DC | PRN
  Administered 2012-08-24: 06:00:00 via TOPICAL

## 2012-08-24 MED ORDER — POLYETHYLENE GLYCOL 3350 17 G PO PACK
17.0000 g | PACK | Freq: Every day | ORAL | Status: DC | PRN
  Administered 2012-08-30: 17 g via ORAL
  Filled 2012-08-24: qty 1

## 2012-08-24 MED ORDER — FENTANYL CITRATE 0.05 MG/ML IJ SOLN
INTRAMUSCULAR | Status: DC | PRN
  Administered 2012-08-24 (×2): 50 ug via INTRAVENOUS

## 2012-08-24 MED ORDER — PROMETHAZINE HCL 25 MG PO TABS: 12.5000 mg | ORAL_TABLET | ORAL | Status: DC | PRN

## 2012-08-24 MED ORDER — MICROFIBRILLAR COLL HEMOSTAT EX PADS
MEDICATED_PAD | CUTANEOUS | Status: DC | PRN
  Administered 2012-08-24: 1 via TOPICAL

## 2012-08-24 MED ORDER — BACITRACIN 50000 UNITS IM SOLR
INTRAMUSCULAR | Status: AC
  Filled 2012-08-24: qty 1

## 2012-08-24 MED ORDER — PROPOFOL 10 MG/ML IV BOLUS
INTRAVENOUS | Status: DC | PRN
  Administered 2012-08-24: 200 mg via INTRAVENOUS

## 2012-08-24 MED ORDER — SODIUM CHLORIDE 0.9 % IV SOLN
INTRAVENOUS | Status: DC
  Administered 2012-08-24 – 2012-08-31 (×10): via INTRAVENOUS

## 2012-08-24 MED ORDER — CEFAZOLIN SODIUM-DEXTROSE 2-3 GM-% IV SOLR
INTRAVENOUS | Status: AC
  Filled 2012-08-24: qty 50

## 2012-08-24 SURGICAL SUPPLY — 69 items
BAG DECANTER FOR FLEXI CONT (MISCELLANEOUS) ×2 IMPLANT
BANDAGE GAUZE ELAST BULKY 4 IN (GAUZE/BANDAGES/DRESSINGS) ×2 IMPLANT
BIT DRILL WIRE PASS 1.3MM (BIT) IMPLANT
BRUSH SCRUB EZ PLAIN DRY (MISCELLANEOUS) ×2 IMPLANT
BUR ACORN 6.0 (BURR) IMPLANT
BUR ADDG 1.1 (BURR) IMPLANT
BUR ROUTER D-58 CRANI (BURR) IMPLANT
CANISTER SUCTION 2500CC (MISCELLANEOUS) ×4 IMPLANT
CLIP TI MEDIUM 6 (CLIP) IMPLANT
CLOTH BEACON ORANGE TIMEOUT ST (SAFETY) ×2 IMPLANT
CONT SPEC 4OZ CLIKSEAL STRL BL (MISCELLANEOUS) ×2 IMPLANT
CORDS BIPOLAR (ELECTRODE) ×2 IMPLANT
DECANTER SPIKE VIAL GLASS SM (MISCELLANEOUS) ×2 IMPLANT
DRAIN CHANNEL 10M FLAT 3/4 FLT (DRAIN) IMPLANT
DRAIN PENROSE 1/2X12 LTX STRL (WOUND CARE) IMPLANT
DRAPE SURG IRRIG POUCH 19X23 (DRAPES) IMPLANT
DRAPE WARM FLUID 44X44 (DRAPE) ×2 IMPLANT
DRILL WIRE PASS 1.3MM (BIT)
DRSG ADAPTIC 3X8 NADH LF (GAUZE/BANDAGES/DRESSINGS) IMPLANT
DRSG OPSITE POSTOP 4X6 (GAUZE/BANDAGES/DRESSINGS) ×4 IMPLANT
DRSG PAD ABDOMINAL 8X10 ST (GAUZE/BANDAGES/DRESSINGS) IMPLANT
DURAPREP 6ML APPLICATOR 50/CS (WOUND CARE) ×2 IMPLANT
ELECT CAUTERY BLADE 6.4 (BLADE) ×2 IMPLANT
ELECT REM PT RETURN 9FT ADLT (ELECTROSURGICAL) ×2
ELECTRODE REM PT RTRN 9FT ADLT (ELECTROSURGICAL) ×1 IMPLANT
EVACUATOR SILICONE 100CC (DRAIN) IMPLANT
GAUZE SPONGE 4X4 16PLY XRAY LF (GAUZE/BANDAGES/DRESSINGS) IMPLANT
GLOVE BIO SURGEON STRL SZ 6.5 (GLOVE) ×2 IMPLANT
GLOVE BIO SURGEON STRL SZ7 (GLOVE) ×2 IMPLANT
GLOVE BIO SURGEON STRL SZ7.5 (GLOVE) IMPLANT
GLOVE BIOGEL PI IND STRL 7.5 (GLOVE) IMPLANT
GLOVE BIOGEL PI IND STRL 8.5 (GLOVE) ×1 IMPLANT
GLOVE BIOGEL PI INDICATOR 7.5 (GLOVE)
GLOVE BIOGEL PI INDICATOR 8.5 (GLOVE) ×1
GLOVE ECLIPSE 8.5 STRL (GLOVE) ×2 IMPLANT
GLOVE EXAM NITRILE LRG STRL (GLOVE) IMPLANT
GLOVE EXAM NITRILE MD LF STRL (GLOVE) IMPLANT
GLOVE EXAM NITRILE XL STR (GLOVE) IMPLANT
GLOVE EXAM NITRILE XS STR PU (GLOVE) IMPLANT
GOWN BRE IMP SLV AUR LG STRL (GOWN DISPOSABLE) ×2 IMPLANT
GOWN BRE IMP SLV AUR XL STRL (GOWN DISPOSABLE) ×2 IMPLANT
GOWN STRL REIN 2XL LVL4 (GOWN DISPOSABLE) ×2 IMPLANT
HEMOSTAT SURGICEL 2X14 (HEMOSTASIS) IMPLANT
HOOK DURA (MISCELLANEOUS) IMPLANT
KIT BASIN OR (CUSTOM PROCEDURE TRAY) ×2 IMPLANT
KIT ROOM TURNOVER OR (KITS) ×2 IMPLANT
NEEDLE HYPO 22GX1.5 SAFETY (NEEDLE) ×2 IMPLANT
NS IRRIG 1000ML POUR BTL (IV SOLUTION) ×4 IMPLANT
PACK CRANIOTOMY (CUSTOM PROCEDURE TRAY) ×2 IMPLANT
PATTIES SURGICAL .5 X.5 (GAUZE/BANDAGES/DRESSINGS) IMPLANT
PATTIES SURGICAL .5 X3 (DISPOSABLE) IMPLANT
PATTIES SURGICAL 1X1 (DISPOSABLE) IMPLANT
PIN MAYFIELD SKULL DISP (PIN) IMPLANT
SPECIMEN JAR SMALL (MISCELLANEOUS) IMPLANT
SPONGE GAUZE 4X4 12PLY (GAUZE/BANDAGES/DRESSINGS) ×2 IMPLANT
SPONGE NEURO XRAY DETECT 1X3 (DISPOSABLE) IMPLANT
SPONGE SURGIFOAM ABS GEL 100 (HEMOSTASIS) IMPLANT
STAPLER SKIN PROX WIDE 3.9 (STAPLE) ×2 IMPLANT
SUT ETHILON 3 0 FSL (SUTURE) IMPLANT
SUT NURALON 4 0 TR CR/8 (SUTURE) ×4 IMPLANT
SUT VIC AB 2-0 CP2 18 (SUTURE) ×4 IMPLANT
SYR 20ML ECCENTRIC (SYRINGE) ×2 IMPLANT
SYR CONTROL 10ML LL (SYRINGE) ×2 IMPLANT
TOWEL OR 17X24 6PK STRL BLUE (TOWEL DISPOSABLE) ×2 IMPLANT
TOWEL OR 17X26 10 PK STRL BLUE (TOWEL DISPOSABLE) ×2 IMPLANT
TRAP SPECIMEN MUCOUS 40CC (MISCELLANEOUS) IMPLANT
TRAY FOLEY CATH 14FRSI W/METER (CATHETERS) IMPLANT
UNDERPAD 30X30 INCONTINENT (UNDERPADS AND DIAPERS) IMPLANT
WATER STERILE IRR 1000ML POUR (IV SOLUTION) ×2 IMPLANT

## 2012-08-24 NOTE — Progress Notes (Signed)
MD Chilton Si) from radiology called with CT results that showed increased right subdural collection and increased leftward midline shift.  MD Chilton Si) was given the on call phone number for neurosurgery Danielle Dess) and will report CT findings to him.

## 2012-08-24 NOTE — Op Note (Signed)
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 endotracheal Indications: The patient is a 72 year old individual who several days ago underwent craniotomy for drainage of a subdural hematoma. He was found to have reaccumulated a subdural with greater shift going from 10 mm immediately postoperatively to over 15 mm currently. A subdural collection that was postoperatively noted to be 15 mm now measured 27 mm in thickness. He is taken to the operating room to undergo re evacuation. Clinically the patient was confused but moving all 4 extremities.  Procedure the patient was brought to the operating room supine on a stretcher after the smooth induction of general endotracheal anesthesia his head was turned to the left his previous dressing was taken down the scalp was cleansed with alcohol after removing the surgical staples it was then prepped with DuraPrep and draped sterilely. Incision was opened removing the subgaleal sutures. There was a bone flap that was held with 2 plates which were removed. When the bone flap was elevated there was significant Gelfoam and blood clot that was encountered. Removal of this yielded acute blood and subdural fluid. This was evacuated. Then by carefully dissecting under the dural leaves and found some more blood and fluid which was evacuated and suctioned. The area of the subdural space was copiously year gated with a micro-irrigator. The dural leaves and portions of membrane were elevated and irrigated until clear. Then after inspecting and noting a good decompression the dura was loosely reapproximated. A small flat  Jackson-Pratt drain was inserted towards the frontal region. The bone flap was left out and not replaced. The galea was closed with 2-0 Vicryl in interrupted fashion, and surgical staples were placed in the scalp. The drain was connected to a bulb suction. The patient tolerated procedure well a dry sterile dressing was placed over the scalp and the drain site and held in place with OpSite. Blood loss was less than 25 cc.

## 2012-08-24 NOTE — Transfer of Care (Signed)
Immediate Anesthesia Transfer of Care Note  Patient: Austin Salas  Procedure(s) Performed: Procedure(s): CRANIECTOMY HEMATOMA RE-EVACUATION SUBDURAL (N/A)  Patient Location: PACU  Anesthesia Type:General  Level of Consciousness: awake and confused  Airway & Oxygen Therapy: Patient Spontanous Breathing and Patient connected to face mask oxygen  Post-op Assessment: Report given to PACU RN and Post -op Vital signs reviewed and stable  Post vital signs: Reviewed and stable  Complications: No apparent anesthesia complications

## 2012-08-24 NOTE — Progress Notes (Signed)
PT Cancellation Note  Patient Details Name: Andray Assefa MRN: [id number] DOB: 1941/01/08   Cancelled Treatment:    Reason Eval/Treat Not Completed: Medical issues which prohibited therapy (Pt went back to OR this morning for re-evacuation)).  PT will need a new order to resume treatment with this patient.     Rollene Rotunda Darnelle Derrick, PT, DPT [phone]   08/24/2012, 11:20 AM

## 2012-08-24 NOTE — Progress Notes (Signed)
Pt tx from 3109 to Neuro OR via bed by OR RNs in preparation for repeat craniotomy by Dr. Danielle Dess.

## 2012-08-24 NOTE — Progress Notes (Signed)
MD Chilton Si) with radiology called back to report that she has been unable to reach Dr. Danielle Dess after three phone calls regarding the pt's latest CT findings.  MD Chilton Si) was given Elink's phone number and will notify the CCM MD.

## 2012-08-24 NOTE — Progress Notes (Signed)
Patient ID: Austin Salas, male   DOB: Jan 12, 1941, 72 y.o.   MRN: [id number] Patient postop is a redo craniotomy for evacuation of recurrent subdural. Patient is awake appropriate Lovenox and is well no signs of weakness. His headaches felt better. I appreciate all of Dr. Verlee Rossetti help of call.

## 2012-08-24 NOTE — Progress Notes (Signed)
Rehab Admissions Coordinator Note:  Patient was screened by Clois Dupes for appropriateness for an Inpatient Acute Rehab Consult. Therapy recommended on 08/21/12. Noted to OR this a.m. We will reassess postoperatively.   Clois Dupes 08/24/2012, 8:37 AM  I can be reached at [phone].

## 2012-08-24 NOTE — Progress Notes (Signed)
Patient ID: Austin Salas, male   DOB: 07-Apr-1940, 72 y.o.   MRN: [id number] Called about 3 am regarding CT head done at 1 am. Ct shows substantial re accumulation compared to scan from 5/28 with over15 mm shift and collection now measuring 25 mm thickness. Prev thckness was 15 mm, and 10 mm shift. Clinically patient has been stable, arousing to voice moving all extremities perhaps a little confused. In light of radiographic worsening with significant shift repeat crani is warranted. Return to OR this AM.

## 2012-08-24 NOTE — Preoperative (Signed)
Beta Blockers   Reason not to administer Beta Blockers:Not Applicable. No home beta blockers 

## 2012-08-24 NOTE — Progress Notes (Signed)
PULMONARY  / CRITICAL CARE MEDICINE  Name: Austin Salas MRN: [id number] DOB: 1940-07-28    ADMISSION DATE:  08/19/2012 CONSULTATION DATE:  08/19/2012  REFERRING MD :  Wynetta Emery PRIMARY SERVICE: NSGY  CHIEF COMPLAINT:  Medical management SDH  BRIEF PATIENT DESCRIPTION: 72 y/o male with hypertension and DM2 was admitted on 5/28 for evacuation of a Subdural Hematoma.  PCCM consulted for medical management.  SIGNIFICANT EVENTS / STUDIES:  5/28 CT head (OSH) > large frontal R SDH with 11mm midline shift 5/28 craniotomy for evacuation of subacute R SDH 5/29 CT head >> mild improvement SDH, no change 11 mm leftward shift 6/02 Increase SDH. Back to OR for evacuation   LINES / TUBES: 5/28 R radial a-line > 5/29 5/28 R intracranial drain >> 5/29  CULTURES: MRSA screen 5/28 >> negative  ANTIBIOTICS: Cefazolin 5/28 post op x 2  SUBJECTIVE:  Extubated in PACU after repeat SDH evacuation this AM. Awake, + F/C, NAD  VITAL SIGNS: Temp:  [97.3 F (36.3 C)-99.1 F (37.3 C)] 98.8 F (37.1 C) (06/02 1552) Pulse Rate:  [55-111] 64 (06/02 1600) Resp:  [12-24] 14 (06/02 1600) BP: (106-167)/(46-92) 147/80 mmHg (06/02 1600) SpO2:  [87 %-100 %] 100 % (06/02 1600) Weight:  [117.4 kg (258 lb 13.1 oz)] 117.4 kg (258 lb 13.1 oz) (06/02 0400) HEMODYNAMICS:     INTAKE / OUTPUT: Intake/Output     06/01 0701 - 06/02 0700 06/02 0701 - 06/03 0700   P.O. 760    I.V. (mL/kg) 650 (5.5) 892.5 (7.6)   Total Intake(mL/kg) 1410 (12) 892.5 (7.6)   Urine (mL/kg/hr) 1125 (0.4) 300 (0.3)   Blood 25 (0)    Total Output 1150 300   Net +260 +592.5        Urine Occurrence 3 x      PHYSICAL EXAMINATION:  Gen: NAD  HEENT: head dressing in place, PERRL, EOMI PULM: CTA B CV: RRR, no mgr, no JVD AB: BS+, soft, nontender, no hsm Ext: warm, no edema, no clubbing, no cyanosis Derm: no rash or skin breakdown Neuro: A&Ox4, CN II-XII intact, MAEW   LABS:  Recent Labs Lab 08/21/12 1609 08/22/12 0614  08/23/12 0447 08/24/12 0453  HGB  --  12.4* 12.5* 12.0*  WBC  --  10.0 11.0* 8.9  PLT  --  136* 159 217  NA 136 134* 134* 135  K 4.9 5.0 4.6 5.0  CL 102 100 97 96  CO2 22 22 23 24   GLUCOSE 183* 163* 160* 168*  BUN 33* 29* 29* 31*  CREATININE 1.49* 1.41* 1.37* 1.41*  CALCIUM 9.2 9.3 9.7 9.8  MG  --  1.6  --   --   PHOS  --  2.5  --   --     Recent Labs Lab 08/23/12 1737 08/23/12 2204 08/24/12 0642 08/24/12 0806 08/24/12 1200  GLUCAP 129* 155* 156* 163* 164*    CXR:  Ct Head Wo Contrast  08/24/2012   **ADDENDUM** CREATED: 08/24/2012 03:16:14  These results were called by telephone on 08/24/2012 at 03:16 a.m. to Dr. Barnett Abu, who verbally acknowledged these results.  **END ADDENDUM** SIGNED BY: Tonia Ghent, M.D.  08/24/2012   *RADIOLOGY REPORT*  Clinical Data: Evacuation of subdural hematoma  CT HEAD WITHOUT CONTRAST  Technique:  Contiguous axial images were obtained from the base of the skull through the vertex without contrast.  Comparison: 08/20/2012  Findings: Evidence of right parietal craniotomy again noted.  Drain has been removed.  Mixed attenuation right subdural  collection has increased in size, now 2.5 cm maximally on image 18 compared to 1.6 cm at the approximate similar anatomic level of the previous exam. Increased leftward midline shift is present, now 1.5 cm image 18 compared to 1.1 cm at a similar anatomic level.  There has been interval increase in acute to subacute hemorrhage tracking along the tentorium cerebelli on the right and along the right parietal convexity within the subdural collection.  IMPRESSION: Increased right subdural mixed density collection with subacute/acute hemorrhagic component, and increased leftward midline shift. These results were called by telephone on 08/24/2012 at 1:45 a.m. to Jonny Ruiz, RN, who verbally acknowledged these results. I also paged Dr. Danielle Dess at 1:45am and 2:05 am and discussed these findings with Dr. Darrick Penna by phone at 2:15am.   Original Report Authenticated By: Christiana Pellant, M.D.     ASSESSMENT / PLAN:  NEUROLOGIC A:  Subacute Subdural hematoma with midline shift> resolved post craniotomy and evacuation P:   -post op care per NSGY -keppra per NSGY  PULMONARY A: Post op resp failure - resolved P:   -monitor O2 saturation -pulm toilet  CARDIOVASCULAR A: Hypertension P:  -labetalol prn SBP >160 -Cont hold crestor  -slowly resume home anti-htn meds and watch K -tele  RENAL A:  CKD Stage 3- baseline Cr 1.8 according to OSH labs Hyperkalemia - improved. K 5.2 > 6.2 > 5.0 > 5.3; no obvious medications that could be cause, ? Due to brain trauma. Only new meds were Zocor (on Crestor at home) and Ancef; both stopped P:   -follow BMP -monitor UOP  GASTROINTESTINAL A:  No acute issues P:   -advance diet as tolerated  HEMATOLOGIC A:  Normocytic anemia without active bleeding, anemia of CKD?[name] Ferritin 456 P:  -cbc in am  INFECTIOUS A:  No acute issues P:  -monitor fever curve  ENDOCRINE A:  DM2 P:   -SSI   Billy Fischer, MD ; [facility] service Mobile [phone].  After 5:30 PM or weekends, call [phone]

## 2012-08-24 NOTE — Anesthesia Preprocedure Evaluation (Addendum)
Anesthesia Evaluation  Patient identified by MRN, date of birth, ID band Patient confused    Reviewed: Allergy & Precautions, H&P , NPO status , Patient's Chart, lab work & pertinent test results, reviewed documented beta blocker date and time   Airway Mallampati: II      Dental  (+) Edentulous Upper, Edentulous Lower and Dental Advisory Given   Pulmonary          Cardiovascular hypertension,     Neuro/Psych    GI/Hepatic   Endo/Other  diabetes, Type 2  Renal/GU      Musculoskeletal   Abdominal   Peds  Hematology   Anesthesia Other Findings   Reproductive/Obstetrics                          Anesthesia Physical Anesthesia Plan  ASA: III and emergent  Anesthesia Plan: General   Post-op Pain Management:    Induction: Intravenous  Airway Management Planned: Oral ETT  Additional Equipment:   Intra-op Plan:   Post-operative Plan: Possible Post-op intubation/ventilation  Informed Consent: I have reviewed the patients History and Physical, chart, labs and discussed the procedure including the risks, benefits and alternatives for the proposed anesthesia with the patient or authorized representative who has indicated his/her understanding and acceptance.   Dental advisory given  Plan Discussed with: Anesthesiologist and Surgeon  Anesthesia Plan Comments:        Anesthesia Quick Evaluation

## 2012-08-24 NOTE — Progress Notes (Signed)
Chaplain visited with pt and pt's daughter while rounding in 3100.  Pt had surgery earlier this morning and is now awake, alert, and eating his liquid breakfast. Daughter said pt was speaking incoherently before surgery but is now back to being himself. She is greatly encouraged. I spoke words of comfort and encouragement to pt. He asked me to come see him again.

## 2012-08-25 ENCOUNTER — Inpatient Hospital Stay (HOSPITAL_COMMUNITY): Payer: BC Managed Care – PPO

## 2012-08-25 ENCOUNTER — Encounter (HOSPITAL_COMMUNITY): Payer: Self-pay | Admitting: Radiology

## 2012-08-25 LAB — BASIC METABOLIC PANEL
BUN: 40 mg/dL — ABNORMAL HIGH (ref 6–23)
CO2: 25 mEq/L (ref 19–32)
Chloride: 98 mEq/L (ref 96–112)
Glucose, Bld: 142 mg/dL — ABNORMAL HIGH (ref 70–99)
Potassium: 4.9 mEq/L (ref 3.5–5.1)

## 2012-08-25 LAB — GLUCOSE, CAPILLARY
Glucose-Capillary: 116 mg/dL — ABNORMAL HIGH (ref 70–99)
Glucose-Capillary: 159 mg/dL — ABNORMAL HIGH (ref 70–99)

## 2012-08-25 NOTE — Progress Notes (Signed)
Physical Therapy Treatment Patient Details Name: Austin Salas MRN: [id number] DOB: [birth year] Today's Date: 08/25/2012 Time: 4098-1191 PT Time Calculation (min): 27 min  PT Assessment / Plan / Recommendation Comments on Treatment Session  Patient able to stand easier today, still with significant LE weakness.  Will need CIR level rehab to achieve goal to rerun home.    Follow Up Recommendations  CIR;Supervision/Assistance - 24 hour     Does the patient have the potential to tolerate intense rehabilitation   Yes  Barriers to Discharge  None      Equipment Recommendations  Rolling walker with 5" wheels       Frequency Min 4X/week   Plan Discharge plan remains appropriate    Precautions / Restrictions Precautions Precautions: Fall Precaution Comments: drain from back of head   Pertinent Vitals/Pain C/o headache RN aware    Mobility  Bed Mobility Supine to Sit: 3: Mod assist;HOB elevated Sitting - Scoot to Edge of Bed: 4: Min assist Transfers Transfers: Stand Pivot Transfers Sit to Stand: 3: Mod assist;With upper extremity assist;From bed Stand to Sit: 4: Min assist;With upper extremity assist;1: +2 Total assist Stand to Sit: Patient Percentage: 60% Stand Pivot Transfers: 3: Mod assist;4: Min assist Details for Transfer Assistance: able to stand from bed with cues for hand placemen, and assist to control lowering into low recline; cues for increased step lenth and height; had difficulty picking up left foot to pivot      PT Goals Acute Rehab PT Goals Time For Goal Achievement: 09/04/12 (updated due to returned to surgery yesterday for SDH evac.) Pt will go Supine/Side to Sit: with modified independence PT Goal: Supine/Side to Sit - Progress: Progressing toward goal Pt will go Sit to Stand: with modified independence PT Goal: Sit to Stand - Progress: Progressing toward goal Pt will Transfer Bed to Chair/Chair to Bed: with modified independence PT Transfer Goal: Bed  to Chair/Chair to Bed - Progress: Progressing toward goal Pt will Ambulate: >150 feet;with rolling walker;with modified independence PT Goal: Ambulate - Progress: Progressing toward goal Pt will Go Up / Down Stairs: 3-5 stairs;with rail(s);with supervision PT Goal: Up/Down Stairs - Progress: Progressing toward goal  Visit Information  Last PT Received On: 08/25/12 Assistance Needed: +2    Subjective Data  Subjective: Still have a headache   Cognition  Cognition Arousal/Alertness: Awake/alert Behavior During Therapy: WFL for tasks assessed/performed Overall Cognitive Status: Within Functional Limits for tasks assessed    Balance  Dynamic Sitting Balance Dynamic Sitting - Balance Support: Bilateral upper extremity supported;Feet supported Dynamic Sitting - Level of Assistance: 4: Min assist  End of Session PT - End of Session Equipment Utilized During Treatment: Gait belt Activity Tolerance: Patient limited by fatigue;Patient limited by pain Patient left: in chair;with call bell/phone within reach Nurse Communication: Patient requests pain meds   GP     [facility] 08/25/2012, 10:46 AM Sheran Lawless, PT [phone] 08/25/2012

## 2012-08-25 NOTE — Progress Notes (Signed)
Subjective: Patient reports He feels better less headache improved nausea  Objective: Vital signs in last 24 hours: Temp:  [98.2 F (36.8 C)-99.5 F (37.5 C)] 98.4 F (36.9 C) (06/03 1558) Pulse Rate:  [25-76] 62 (06/03 1500) Resp:  [14-21] 19 (06/03 1500) BP: (101-169)/(33-143) 149/55 mmHg (06/03 1500) SpO2:  [91 %-100 %] 94 % (06/03 1500)  Intake/Output from previous day: 06/02 0701 - 06/03 0700 In: 1117.5 [I.V.:1117.5] Out: 1750 [Urine:1750] Intake/Output this shift: Total I/O In: 1512.5 [I.V.:1512.5] Out: 1150 [Urine:1150]  More awake alert oriented no pronator drift no sign left-sided weakness  Lab Results:  Recent Labs  08/23/12 0447 08/24/12 0453  WBC 11.0* 8.9  HGB 12.5* 12.0*  HCT 37.7* 36.1*  PLT 159 217   BMET  Recent Labs  08/24/12 0453 08/25/12 0350  NA 135 132*  K 5.0 4.9  CL 96 98  CO2 24 25  GLUCOSE 168* 142*  BUN 31* 40*  CREATININE 1.41* 1.79*  CALCIUM 9.8 9.0    Studies/Results: Ct Head Wo Contrast  08/25/2012   *RADIOLOGY REPORT*  Clinical Data: Followup subdural hematoma.  CT HEAD WITHOUT CONTRAST  Technique:  Contiguous axial images were obtained from the base of the skull through the vertex without contrast.  Comparison: 08/24/2012.  Findings: Post right frontal craniectomy for drainage of right- sided subdural hematoma with catheter in place (reoperation since prior CT).  Pneumocephalus at the operative site.  Right-sided subdural hematoma now measures 1.7 cm maximal dimension versus prior 2.5 cm.  Marked mass effect upon the right lateral ventricle with midline shift to the left by 11.5 mm versus prior 14.8 mm. Trapping of the left lateral ventricle.  Right uncus displaced slightly into the adjacent cistern. Deformity of the mid brain.  Left parietal - occipital hypodensity without change.  Infarction not excluded.  Vascular calcifications.  IMPRESSION: Reoperation with placement of subdural drain with decrease in size although incomplete  resolution of right-sided subdural hematoma now measuring 1.7 cm versus 2.5 cm on prior examination.  Midline shift to the left now 11.5 mm versus prior 14.8 mm.  Trapping of the left lateral ventricle.  Please see above.  This has been made a PRA call report utilizing dashboard call feature.   Original Report Authenticated By: Lacy Duverney, M.D.   Ct Head Wo Contrast  08/24/2012   **ADDENDUM** CREATED: 08/24/2012 03:16:14  These results were called by telephone on 08/24/2012 at 03:16 a.m. to Dr. Barnett Abu, who verbally acknowledged these results.  **END ADDENDUM** SIGNED BY: Tonia Ghent, M.D.  08/24/2012   *RADIOLOGY REPORT*  Clinical Data: Evacuation of subdural hematoma  CT HEAD WITHOUT CONTRAST  Technique:  Contiguous axial images were obtained from the base of the skull through the vertex without contrast.  Comparison: 08/20/2012  Findings: Evidence of right parietal craniotomy again noted.  Drain has been removed.  Mixed attenuation right subdural collection has increased in size, now 2.5 cm maximally on image 18 compared to 1.6 cm at the approximate similar anatomic level of the previous exam. Increased leftward midline shift is present, now 1.5 cm image 18 compared to 1.1 cm at a similar anatomic level.  There has been interval increase in acute to subacute hemorrhage tracking along the tentorium cerebelli on the right and along the right parietal convexity within the subdural collection.  IMPRESSION: Increased right subdural mixed density collection with subacute/acute hemorrhagic component, and increased leftward midline shift. These results were called by telephone on 08/24/2012 at 1:45 a.m. to Jonny Ruiz, RN,  who verbally acknowledged these results. I also paged Dr. Danielle Dess at 1:45am and 2:05 am and discussed these findings with Dr. Darrick Penna by phone at 2:15am.  Original Report Authenticated By: Christiana Pellant, M.D.    Assessment/Plan: Continue to mobilize in the ICU continued a J-P drain followup CT  scan 48 hours  LOS: 6 days     Lynk Marti P 08/25/2012, 4:18 PM

## 2012-08-25 NOTE — Progress Notes (Signed)
Has tolerated extubation without difficulty. Discussed with Dr Wynetta Emery. PCCM will sign off. Please call if we can be of further assistance  Billy Fischer, MD ; [facility] [phone].  After 5:30 PM or weekends, call [phone]

## 2012-08-25 NOTE — Progress Notes (Signed)
Rehab Admissions Coordinator Note:  Patient was screened by Clois Dupes for appropriateness for an Inpatient Acute Rehab Consult.  At this time, we are recommending Inpatient Rehab consult.  Clois Dupes 08/25/2012, 3:00 PM  I can be reached at [phone].

## 2012-08-26 ENCOUNTER — Encounter (HOSPITAL_COMMUNITY): Payer: Self-pay | Admitting: Physical Medicine and Rehabilitation

## 2012-08-26 DIAGNOSIS — S065X9A Traumatic subdural hemorrhage with loss of consciousness of unspecified duration, initial encounter: Secondary | ICD-10-CM

## 2012-08-26 LAB — GLUCOSE, CAPILLARY
Glucose-Capillary: 114 mg/dL — ABNORMAL HIGH (ref 70–99)
Glucose-Capillary: 146 mg/dL — ABNORMAL HIGH (ref 70–99)
Glucose-Capillary: 121 mg/dL — ABNORMAL HIGH (ref 70–99)
Glucose-Capillary: 135 mg/dL — ABNORMAL HIGH (ref 70–99)

## 2012-08-26 NOTE — Consult Note (Signed)
Physical Medicine and Rehabilitation Consult Reason for Consult: SDH with LE weakness,  Referring Physician:  Dr. Wynetta Emery   HPI: Austin Salas is a 72 y.o. male with history of DM, hyperlipidemia; who was admitted via [facility] on 08/19/12 with acute on subacute SDH with mass effect and midline shift. Patient with reports of dizziness, LE weakness, confusion and headaches for a week PTA. He was evaluated by Dr. Wynetta Emery and underwent burr hole for evacuation the same day. Followed by CCM for electrolyte abnormality and questions hyperkalemia due to CKD. [name] Patient developed confusion on 06/02 and  Follow up CCT with reaccumulation of subdural collection with shift and he underwent craniectomy with evacuation of acute right frontoparietal SDH by Dr. Danielle Dess. Therapies reinitiated and patient note to have significant LE weakness with difficulty with mobility. Therapy team recommending CIR.  Pt does not recall head trauma but did fall at home requiring neighbor assist Review of Systems  HENT: Negative for hearing loss.   Eyes: Positive for blurred vision.  Respiratory: Negative for sputum production and shortness of breath.   Cardiovascular: Negative for chest pain and palpitations.  Neurological: Positive for weakness and headaches.    Past Medical History  Diagnosis Date  . Diabetes mellitus without complication   . Hyperlipidemia    Past Surgical History  Procedure Laterality Date  . Craniotomy Right 08/19/2012    Procedure: Right CRANIOTOMY for HEMATOMA EVACUATION SUBDURAL;  Surgeon: Mariam Dollar, MD;  Location: [facility]UR[facility];  Service: Neurosurgery;  Laterality: Right;  . Craniotomy N/A 08/24/2012    Procedure: CRANIECTOMY HEMATOMA RE-EVACUATION SUBDURAL;  Surgeon: Barnett Abu, MD;  Location: [facility];  Service: Neurosurgery;  Laterality: N/A;   Family History  Problem Relation Age of Onset  . Diabetes Brother     Social History:  Married. Works full time--in Animal nutritionist".  Wife in treatment for breast cancer. He smoke about 15 years--quit 30 years ago. He has never used smokeless tobacco. He reports that he does not drink alcohol or use illicit drugs.   Allergies: No Known Allergies  Medications Prior to Admission  Medication Sig Dispense Refill  . amLODipine-benazepril (LOTREL) 10-20 MG per capsule Take 1 capsule by mouth 2 (two) times daily.      [name] aspirin 81 MG chewable tablet Chew 81 mg by mouth daily.      [name] glimepiride (AMARYL) 2 MG tablet Take 2 mg by mouth daily before breakfast.      . pioglitazone (ACTOS) 45 MG tablet Take 45 mg by mouth daily.      . rosuvastatin (CRESTOR) 10 MG tablet Take 10 mg by mouth daily.      . valsartan-hydrochlorothiazide (DIOVAN-HCT) 320-25 MG per tablet Take 1 tablet by mouth daily.        Home: Home Living Lives With: Spouse Available Help at Discharge: Available 24 hours/day Type of Home: Mobile home Home Access: Stairs to enter [employer] of Steps: 5-6 Entrance Stairs-Rails: Right;Left;Can reach both Home Layout: One level Bathroom Shower/Tub: Walk-in shower;Door [employer] Toilet: Handicapped height Home Adaptive Equipment: None Additional Comments: wife with breast cancer taking chemo and radiation  Functional History: Prior Function Able to Take Stairs?: Yes Driving: No (doesnt drive much) Vocation: Full time employment Comments: Doctor, hospital Status:  Mobility: Bed Mobility Bed Mobility: Not assessed Supine to Sit: 3: Mod assist;HOB elevated Sitting - Scoot to [place] of Bed: 4: Min assist Transfers Transfers: Stand Pivot Transfers Sit to Stand: 3: Mod assist;With upper extremity  assist;From bed Sit to Stand: Patient Percentage: 60% Stand to Sit: 4: Min assist;With upper extremity assist;1: +2 Total assist Stand to Sit: Patient Percentage: 60% Stand Pivot Transfers: 3: Mod assist;4: Min assist Ambulation/Gait Ambulation/Gait Assistance: 4: Min  assist (+1 pushing chair behind patient) Ambulation Distance (Feet): 160 Feet Assistive device: Rolling walker Ambulation/Gait Assistance Details: Verbal cues to stand upright and look forward during gait.  Cues for safe use of RW, especially during turns to keep feet inside RW. Gait Pattern: Step-through pattern;Decreased stride length;Ataxic;Trunk flexed;Wide base of support Gait velocity: Slow gait speed    ADL:    Cognition: Cognition Overall Cognitive Status: Within Functional Limits for tasks assessed Arousal/Alertness: Awake/alert Orientation Level: Oriented X4 Cognition Arousal/Alertness: Awake/alert Behavior During Therapy: WFL for tasks assessed/performed Overall Cognitive Status: Within Functional Limits for tasks assessed  Blood pressure 151/52, pulse 82, temperature 98.6 F (37 C), temperature source Oral, resp. rate 17, height 6' (1.829 m), weight 117.4 kg (258 lb 13.1 oz), SpO2 94.00%.   Physical Exam  Nursing note and vitals reviewed. Constitutional: He is oriented to person, place, and time. He appears well-developed and well-nourished.  HENT:  Head: Normocephalic.  Dry dressing left scalp with JP drain in place.   Eyes: Pupils are equal, round, and reactive to light.  Neck: Normal range of motion. Neck supple.  Cardiovascular: Normal rate and regular rhythm.   Pulmonary/Chest: Effort normal and breath sounds normal.  Abdominal: Soft. Bowel sounds are normal.  Musculoskeletal: He exhibits no edema.  Neurological: He is alert and oriented to person, place, and time.  Left facial weakness. Delayed verbal output.  Mild sensory deficits LLE. Able to follow basic commands without difficulty.   Skin: Skin is warm and dry.  4/5 bilateral Delt,Bi,Tri,Grip,HF,KE,Ankle DF/PF  Results for orders placed during the hospital encounter of 08/19/12 (from the past 24 hour(s))  GLUCOSE, CAPILLARY     Status: Abnormal   Collection Time    08/25/12 12:03 PM      Result  Value Range   Glucose-Capillary 116 (*) 70 - 99 mg/dL  GLUCOSE, CAPILLARY     Status: Abnormal   Collection Time    08/25/12  3:57 PM      Result Value Range   Glucose-Capillary 159 (*) 70 - 99 mg/dL   Comment 1 Notify RN     Comment 2 Documented in Chart    GLUCOSE, CAPILLARY     Status: Abnormal   Collection Time    08/25/12 10:16 PM      Result Value Range   Glucose-Capillary 126 (*) 70 - 99 mg/dL   Comment 1 Notify RN     Comment 2 Documented in Chart    GLUCOSE, CAPILLARY     Status: Abnormal   Collection Time    08/26/12  8:07 AM      Result Value Range   Glucose-Capillary 114 (*) 70 - 99 mg/dL   Ct Head Wo Contrast  08/25/2012   *RADIOLOGY REPORT*  Clinical Data: Followup subdural hematoma.  CT HEAD WITHOUT CONTRAST  Technique:  Contiguous axial images were obtained from the base of the skull through the vertex without contrast.  Comparison: 08/24/2012.  Findings: Post right frontal craniectomy for drainage of right- sided subdural hematoma with catheter in place (reoperation since prior CT).  Pneumocephalus at the operative site.  Right-sided subdural hematoma now measures 1.7 cm maximal dimension versus prior 2.5 cm.  Marked mass effect upon the right lateral ventricle with midline shift to the left  by 11.5 mm versus prior 14.8 mm. Trapping of the left lateral ventricle.  Right uncus displaced slightly into the adjacent cistern. Deformity of the mid brain.  Left parietal - occipital hypodensity without change.  Infarction not excluded.  Vascular calcifications.  IMPRESSION: Reoperation with placement of subdural drain with decrease in size although incomplete resolution of right-sided subdural hematoma now measuring 1.7 cm versus 2.5 cm on prior examination.  Midline shift to the left now 11.5 mm versus prior 14.8 mm.  Trapping of the left lateral ventricle.  Please see above.  This has been made a PRA call report utilizing dashboard call feature.   Original Report Authenticated By:  Lacy Duverney, M.D.    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 Does the need for close, 24 hr/day medical supervision in concert with the patient's rehab needs make it unreasonable for this patient to be served in a less intensive setting? Yes 2. Co-Morbidities requiring supervision/potential complications: HTN,DM 3. Due to bladder management, bowel management, safety, skin/wound care, disease management, medication administration, pain management and patient education, does the patient require 24 hr/day rehab nursing? Yes 4. Does the patient require coordinated care of a physician, rehab nurse, PT (1-2 hrs/day, 5 days/week), OT (1-2 hrs/day, 5 days/week) and SLP (.5-1 hrs/day, 5 days/week) to address physical and functional deficits in the context of the above medical diagnosis(es)? Yes Addressing deficits in the following areas: balance, endurance, locomotion, strength, transferring, bowel/bladder control, bathing, dressing, feeding, grooming, toileting and cognition 5. Can the patient actively participate in an intensive therapy program of at least 3 hrs of therapy per day at least 5 days per week? Yes 6. The potential for patient to make measurable gains while on inpatient rehab is excellent 7. Anticipated functional outcomes upon discharge from inpatient rehab are sup mobility with PT, Sup ADL with OT, Improve recall and attention with SLP. 8. Estimated rehab length of stay to reach the above functional goals is: 2 wk 9. Does the patient have adequate social supports to accommodate these discharge functional goals? Potentially 10. Anticipated D/C setting: Home 11. Anticipated post D/C treatments: Outpt therapy 12. Overall Rehab/Functional Prognosis: good  RECOMMENDATIONS: This patient's condition is appropriate for continued rehabilitative care in the following setting: CIR Patient has agreed to participate in recommended program. Yes Note that  insurance prior authorization may be required for reimbursement for recommended care.  Comment:    08/26/2012

## 2012-08-26 NOTE — Anesthesia Postprocedure Evaluation (Signed)
  Anesthesia Post-op Note  Patient: Austin Salas  Procedure(s) Performed: Procedure(s): CRANIECTOMY HEMATOMA RE-EVACUATION SUBDURAL (N/A)  Patient Location: PACU  Anesthesia Type:General  Level of Consciousness: awake and confused  Airway and Oxygen Therapy: Patient Spontanous Breathing  Post-op Pain: none  Post-op Assessment: Post-op Vital signs reviewed, Patient's Cardiovascular Status Stable and Respiratory Function Stable  Post-op Vital Signs: Reviewed and stable  Complications: No apparent anesthesia complications

## 2012-08-26 NOTE — Progress Notes (Signed)
I await OT eval and then will proceed with pursuing insurance approval for a possible inpt rehab admission. I will follow up tomorrow. [phone]

## 2012-08-26 NOTE — Progress Notes (Signed)
Subjective: Patient reports Overall is feeling better with less headache no nausea or his legs feel fine with no new numbness or tingling  Objective: Vital signs in last 24 hours: Temp:  [98.2 F (36.8 C)-99.6 F (37.6 C)] 98.6 F (37 C) (06/04 0808) Pulse Rate:  [25-82] 82 (06/03 2000) Resp:  [14-25] 16 (06/04 0700) BP: (125-181)/(33-63) 145/52 mmHg (06/04 0700) SpO2:  [91 %-99 %] 94 % (06/04 0600)  Intake/Output from previous day: 06/03 0701 - 06/04 0700 In: 2700 [I.V.:2700] Out: 2440 [Urine:2400; Drains:40] Intake/Output this shift: Total I/O In: -  Out: 350 [Urine:350]  Awake alert oriented x4 strength is 5 out of 5 in his upper and lower 70s no evidence of pronator drift  Lab Results:  Recent Labs  08/24/12 0453  WBC 8.9  HGB 12.0*  HCT 36.1*  PLT 217   BMET  Recent Labs  08/24/12 0453 08/25/12 0350  NA 135 132*  K 5.0 4.9  CL 96 98  CO2 24 25  GLUCOSE 168* 142*  BUN 31* 40*  CREATININE 1.41* 1.79*  CALCIUM 9.8 9.0    Studies/Results: Ct Head Wo Contrast  08/25/2012   *RADIOLOGY REPORT*  Clinical Data: Followup subdural hematoma.  CT HEAD WITHOUT CONTRAST  Technique:  Contiguous axial images were obtained from the base of the skull through the vertex without contrast.  Comparison: 08/24/2012.  Findings: Post right frontal craniectomy for drainage of right- sided subdural hematoma with catheter in place (reoperation since prior CT).  Pneumocephalus at the operative site.  Right-sided subdural hematoma now measures 1.7 cm maximal dimension versus prior 2.5 cm.  Marked mass effect upon the right lateral ventricle with midline shift to the left by 11.5 mm versus prior 14.8 mm. Trapping of the left lateral ventricle.  Right uncus displaced slightly into the adjacent cistern. Deformity of the mid brain.  Left parietal - occipital hypodensity without change.  Infarction not excluded.  Vascular calcifications.  IMPRESSION: Reoperation with placement of subdural drain  with decrease in size although incomplete resolution of right-sided subdural hematoma now measuring 1.7 cm versus 2.5 cm on prior examination.  Midline shift to the left now 11.5 mm versus prior 14.8 mm.  Trapping of the left lateral ventricle.  Please see above.  This has been made a PRA call report utilizing dashboard call feature.   Original Report Authenticated By: Lacy Duverney, M.D.    Assessment/Plan: Continue mobilizes physical outpatient therapy advance his diet will follow up CAT scan tomorrow. His drain 40 cc out over the last shift so appears to be draining still appears bloody fluid does not appear to be spinal fluid  LOS: 7 days     Janeen Watson P 08/26/2012, 8:19 AM

## 2012-08-27 ENCOUNTER — Encounter (HOSPITAL_COMMUNITY): Payer: Self-pay | Admitting: Radiology

## 2012-08-27 ENCOUNTER — Inpatient Hospital Stay (HOSPITAL_COMMUNITY): Payer: BC Managed Care – PPO

## 2012-08-27 LAB — GLUCOSE, CAPILLARY
Glucose-Capillary: 140 mg/dL — ABNORMAL HIGH (ref 70–99)
Glucose-Capillary: 190 mg/dL — ABNORMAL HIGH (ref 70–99)

## 2012-08-27 MED ORDER — ENSURE COMPLETE PO LIQD
237.0000 mL | Freq: Two times a day (BID) | ORAL | Status: DC
  Administered 2012-08-27 – 2012-09-03 (×14): 237 mL via ORAL

## 2012-08-27 NOTE — Progress Notes (Signed)
I met with patient at bedside. Discussed inpt rehab admission prior to d/c home. I will begin insurance authorization for a possible rehab admission tomorrow if medically ready. [phone]

## 2012-08-27 NOTE — Progress Notes (Signed)
Subjective: Patient reports Overall is doing fine we'll fairly significant headache last night no worse in its been feels slightly better this morning after receiving some medication denies any nausea vomiting numbness and tingling in his arms or his legs  Objective: Vital signs in last 24 hours: Temp:  [98.3 F (36.8 C)-99.8 F (37.7 C)] 99.4 F (37.4 C) (06/05 0413) Pulse Rate:  [46-67] 56 (06/05 0700) Resp:  [14-22] 15 (06/05 0700) BP: (114-184)/(43-75) 151/59 mmHg (06/05 0600) SpO2:  [94 %-100 %] 96 % (06/05 0700)  Intake/Output from previous day: 06/04 0701 - 06/05 0700 In: 1650 [I.V.:1650] Out: 2900 [Urine:2900] Intake/Output this shift:    Awake alert oriented x4 strength 5 out of 5 with no pronator drift wound clean and dry  Lab Results: No results found for this basename: WBC, HGB, HCT, PLT,  in the last 72 hours BMET  Recent Labs  08/25/12 0350  NA 132*  K 4.9  CL 98  CO2 25  GLUCOSE 142*  BUN 40*  CREATININE 1.79*  CALCIUM 9.0    Studies/Results: No results found.  Assessment/Plan: Postop day 8 from the first operation postop day 3 from a second operation and overall making steady improvement. Still with significant headache still some fluid in the subdural space and a her on his followup CAT scan this morning. However no worse slight improvement J-P still seems to be functioning and still seems to have some drainage of some old serous fluid we'll continue J-P drain for now progress immobilize agree with rehabilitation consult depending on J-P output consider discontinuing in the morning  LOS: 8 days     Austin Salas P 08/27/2012, 7:01 AM

## 2012-08-27 NOTE — Evaluation (Signed)
Occupational Therapy Evaluation Patient Details Name: Austin Salas MRN: [id number] DOB: 1940-09-01 Today's Date: 08/27/2012 Time: 4098-1191 OT Time Calculation (min): 21 min  OT Assessment / Plan / Recommendation Clinical Impression  Pt s/p evacuation of subdural hematoma on 5/28 and craniectomy hematoma re-evacuation on 6/2. Will continue to follow acutely in order to address below problem list. Recommending CIR to further progress rehab before eventual return home.    OT Assessment  Patient needs continued OT Services    Follow Up Recommendations  CIR    Barriers to Discharge      Equipment Recommendations   (TBD)    Recommendations for Other Services Rehab consult  Frequency  Min 2X/week    Precautions / Restrictions Precautions Precautions: Fall Precaution Comments: drain from back of head   Pertinent Vitals/Pain See vitals    ADL  Eating/Feeding: Performed;Set up Where Assessed - Eating/Feeding: Chair Grooming: Performed;Wash/dry face;Min guard Where Assessed - Grooming: Unsupported standing Upper Body Bathing: Simulated;Supervision/safety Where Assessed - Upper Body Bathing: Unsupported sitting Lower Body Bathing: Simulated;Moderate assistance Where Assessed - Lower Body Bathing: Supported sit to stand Upper Body Dressing: Performed;Minimal assistance Where Assessed - Upper Body Dressing: Unsupported sitting Lower Body Dressing: Performed;Maximal assistance Where Assessed - Lower Body Dressing: Supported sit to stand Toilet Transfer: Simulated;Minimal assistance Toilet Transfer Method:  (ambulating) Acupuncturist:  (bed) Toileting - Clothing Manipulation and Hygiene: Performed;Minimal assistance Where Assessed - Engineer, mining and Hygiene: Standing (stood at toilet to urinate) Equipment Used: Gait belt;Rolling walker Transfers/Ambulation Related to ADLs: Min guard with RW ADL Comments: Pt unable to cross legs in order to reach  feet.     OT Diagnosis: Generalized weakness;Acute pain  OT Problem List: Decreased strength;Decreased activity tolerance;Decreased knowledge of use of DME or AE;Pain;Impaired balance (sitting and/or standing) OT Treatment Interventions: Self-care/ADL training;DME and/or AE instruction;Therapeutic activities;Patient/family education;Balance training   OT Goals Acute Rehab OT Goals OT Goal Formulation: With patient Time For Goal Achievement: 09/10/12 Potential to Achieve Goals: Good ADL Goals Pt Will Perform Grooming: with modified independence;Standing at sink ADL Goal: Grooming - Progress: Goal set today Pt Will Perform Upper Body Bathing: with modified independence;Sitting, chair;Sitting, edge of bed ADL Goal: Upper Body Bathing - Progress: Goal set today Pt Will Perform Lower Body Bathing: with modified independence;Sit to stand from chair;Sit to stand from bed ADL Goal: Lower Body Bathing - Progress: Goal set today Pt Will Perform Upper Body Dressing: with modified independence;Sitting, bed;Sitting, chair ADL Goal: Upper Body Dressing - Progress: Goal set today Pt Will Perform Lower Body Dressing: with modified independence;Sit to stand from chair;Sit to stand from bed ADL Goal: Lower Body Dressing - Progress: Goal set today Pt Will Transfer to Toilet: with modified independence;Ambulation;with DME;Comfort height toilet ADL Goal: Toilet Transfer - Progress: Goal set today Pt Will Perform Toileting - Clothing Manipulation: with modified independence;Standing;Sitting on 3-in-1 or toilet ADL Goal: Toileting - Clothing Manipulation - Progress: Goal set today Pt Will Perform Toileting - Hygiene: with modified independence;Sit to stand from 3-in-1/toilet ADL Goal: Toileting - Hygiene - Progress: Goal set today Pt Will Perform Tub/Shower Transfer: Shower transfer;with modified independence;Ambulation;with DME ADL Goal: Tub/Shower Transfer - Progress: Goal set today  Visit Information   Last OT Received On: 08/27/12 Assistance Needed: +2 (helpful with lines and leads)    Subjective Data      Prior Functioning     Home Living Lives With: Spouse Available Help at Discharge: Available 24 hours/day Type of Home: Mobile home Home Access: Stairs  to enter Entrance Stairs-Number of Steps: 5-6 Entrance Stairs-Rails: Right;Left;Can reach both Home Layout: One level Bathroom Shower/Tub: Walk-in shower;Door [employer] Toilet: Handicapped height Home Adaptive Equipment: None Additional Comments: wife with breast cancer taking chemo and radiation Prior Function Level of Independence: Independent Able to Take Stairs?: Yes Driving: No Vocation: Full time employment Communication Communication: No difficulties Dominant Hand: Right         Vision/Perception Vision - History Baseline Vision:  (wears glasses for bowling per pt report) Patient Visual Report: No change from baseline   Cognition  Cognition Arousal/Alertness: Awake/alert Behavior During Therapy: WFL for tasks assessed/performed Overall Cognitive Status: Within Functional Limits for tasks assessed    Extremity/Trunk Assessment Right Upper Extremity Assessment RUE ROM/Strength/Tone: [facility] for tasks assessed Left Upper Extremity Assessment LUE ROM/Strength/Tone: WFL for tasks assessed     Mobility Bed Mobility Bed Mobility: Supine to Sit;Sitting - Scoot to Edge of Bed Supine to Sit: 4: Min assist;HOB elevated;With rails Sitting - Scoot to Edge of Bed: 4: Min assist Transfers Transfers: Sit to Stand;Stand to Sit Sit to Stand: 4: Min assist;From bed;With upper extremity assist Stand to Sit: 4: Min assist;To chair/3-in-1;With armrests;With upper extremity assist Details for Transfer Assistance: Assist to stablize RW during sit<>stand (used RUE on RW to pull up) and assist to control descent to chair. VCs for safe hand placement and technique.     Exercise     Balance Balance Balance Assessed:  Yes Static Sitting Balance Static Sitting - Balance Support: Feet supported;No upper extremity supported Static Sitting - Level of Assistance: 5: Stand by assistance   End of Session OT - End of Session Equipment Utilized During Treatment: Gait belt Activity Tolerance: Patient tolerated treatment well Patient left: in chair;with call bell/phone within reach Nurse Communication: Mobility status  GO 08/27/2012 Cipriano Mile OTR/L Pager [phone] Office [phone]      Cipriano Mile 08/27/2012, 11:16 AM

## 2012-08-27 NOTE — Progress Notes (Signed)
INITIAL NUTRITION ASSESSMENT  DOCUMENTATION CODES Per approved criteria  -Obesity Unspecified   INTERVENTION: Ensure Complete po BID, each supplement provides 350 kcal and 13 grams of protein.  NUTRITION DIAGNOSIS: Inadequate oral intake related to decreased appetite as evidenced by meal completion < 50%.   Goal: Pt to meet >/= 90% of their estimated nutrition needs.   Monitor:  PO intake, supplement acceptance, weight trend, labs   Reason for Assessment: Pt identified as at nutrition risk on the Malnutrition Screen Tool  72 y.o. male  Admitting Dx: Subdural hematoma  ASSESSMENT: Pt hopeful for rehab admission soon. Pt states he had no weight loss and had good appetite PTA.  Pt states that his appetite has been poor this hospitalization. He is willing to try ensure.   Height: Ht Readings from Last 1 Encounters:  08/19/12 6' (1.829 m)    Weight: Wt Readings from Last 1 Encounters:  08/24/12 258 lb 13.1 oz (117.4 kg)    Ideal Body Weight: 80.9 kg  % Ideal Body Weight: 145%  Wt Readings from Last 10 Encounters:  08/24/12 258 lb 13.1 oz (117.4 kg)  08/24/12 258 lb 13.1 oz (117.4 kg)  08/24/12 258 lb 13.1 oz (117.4 kg)    Usual Body Weight: 258 lb   % Usual Body Weight: 100%  BMI:  Body mass index is 35.09 kg/(m^2). Obesity Class II  Estimated Nutritional Needs: Kcal: 2100-2300 Protein: 95-110 grams Fluid: > 2.1 L/day  Skin: incisions  Diet Order: Carb Control Meal Completion: < 50%   EDUCATION NEEDS: -No education needs identified at this time   Intake/Output Summary (Last 24 hours) at 08/27/12 1230 Last data filed at 08/27/12 1100  Gross per 24 hour  Intake   1575 ml  Output   2250 ml  Net   -675 ml    Last BM: 6/4   Labs:   Recent Labs Lab 08/21/12 1609 08/22/12 0614 08/23/12 0447 08/24/12 0453 08/25/12 0350  NA 136 134* 134* 135 132*  K 4.9 5.0 4.6 5.0 4.9  CL 102 100 97 96 98  CO2 22 22 23 24 25   BUN 33* 29* 29* 31* 40*   CREATININE 1.49* 1.41* 1.37* 1.41* 1.79*  CALCIUM 9.2 9.3 9.7 9.8 9.0  MG  --  1.6  --   --   --   PHOS  --  2.5  --   --   --   GLUCOSE 183* 163* 160* 168* 142*    CBG (last 3)   Recent Labs  08/26/12 2206 08/27/12 0813 08/27/12 1218  GLUCAP 135* 126* 140*   No results found for this basename: HGBA1C    Scheduled Meds: . amLODipine  5 mg Oral Daily  . docusate sodium  100 mg Oral BID  . irbesartan  300 mg Oral Daily   And  . hydrochlorothiazide  25 mg Oral Daily  . insulin aspart  0-15 Units Subcutaneous TID WC  . levETIRAcetam  500 mg Oral BID  . pantoprazole  40 mg Oral Daily  . senna  1 tablet Oral BID    Continuous Infusions: . sodium chloride 0 mL/hr at 08/22/12 1000  . sodium chloride 75 mL/hr at 08/27/12 1100    Past Medical History  Diagnosis Date  . Diabetes mellitus without complication   . Hyperlipidemia     Past Surgical History  Procedure Laterality Date  . Craniotomy Right 08/19/2012    Procedure: Right CRANIOTOMY for HEMATOMA EVACUATION SUBDURAL;  Surgeon: Mariam Dollar, MD;  Location: [facility];  Service: Neurosurgery;  Laterality: Right;  . Craniotomy N/A 08/24/2012    Procedure: CRANIECTOMY HEMATOMA RE-EVACUATION SUBDURAL;  Surgeon: Barnett Abu, MD;  Location: [facility];  Service: Neurosurgery;  Laterality: N/A;    Kendell Bane RD, LDN, CNSC [phone] Pager [phone] After Hours Pager

## 2012-08-27 NOTE — Progress Notes (Signed)
Physical Therapy Treatment Patient Details Name: Austin Salas MRN: [id number] DOB: October 31, 1940 Today's Date: 08/27/2012 Time: 4098-1191 PT Time Calculation (min): 18 min  PT Assessment / Plan / Recommendation Comments on Treatment Session  Patient progressing with mobility with better activity tolerance and LE strength.  Still high fall risk with impaired body spatial awareness as seen through compensations to prevent balance losses during functional activities.  Feel will still need inpatient rehab to achieve highest functional independence due to wife likely unable to provide physical assist.    Follow Up Recommendations  CIR     Does the patient have the potential to tolerate intense rehabilitation   Yes  Barriers to Discharge  Decreased caregiver support      Equipment Recommendations  Rolling walker with 5" wheels       Frequency Min 4X/week   Plan Discharge plan remains appropriate    Precautions / Restrictions Precautions Precautions: Fall Precaution Comments: drain from back of head   Pertinent Vitals/Pain Denies headache currently    Mobility  Bed Mobility Bed Mobility: Supine to Sit;Sitting - Scoot to Edge of Bed Supine to Sit: 4: Min assist;HOB elevated;With rails Sitting - Scoot to Edge of Bed: 4: Min assist Details for Bed Mobility Assistance: assist to lift trunk upright Transfers Sit to Stand: 4: Min assist;From bed;With upper extremity assist Stand to Sit: 4: Min assist;To chair/3-in-1;With armrests;With upper extremity assist Details for Transfer Assistance: Assist to stablize RW during sit<>stand (used RUE on RW to pull up) and assist to control descent to chair. VCs for safe hand placement and technique. Ambulation/Gait Ambulation/Gait Assistance: 4: Min assist Ambulation Distance (Feet): 160 Feet Assistive device: Rolling walker Ambulation/Gait Assistance Details: cues for posture, negotiating obstacles, staying inside RW especially with turns. Gait  Pattern: Step-through pattern;Decreased stride length;Ataxic;Trunk flexed;Wide base of support Gait velocity: Slow gait speed      PT Goals Acute Rehab PT Goals Pt will go Supine/Side to Sit: with modified independence PT Goal: Supine/Side to Sit - Progress: Progressing toward goal Pt will go Sit to Stand: with modified independence PT Goal: Sit to Stand - Progress: Progressing toward goal Pt will Ambulate: >150 feet;with rolling walker;with modified independence PT Goal: Ambulate - Progress: Progressing toward goal  Visit Information  Last PT Received On: 08/27/12 Assistance Needed: +1 PT/OT Co-Evaluation/Treatment: Yes    Subjective Data  Subjective: Headache is better   Cognition  Cognition Arousal/Alertness: Awake/alert Behavior During Therapy: WFL for tasks assessed/performed Overall Cognitive Status: Within Functional Limits for tasks assessed    Balance  Balance Balance Assessed: Yes Static Sitting Balance Static Sitting - Balance Support: Feet supported;No upper extremity supported Static Sitting - Level of Assistance: 5: Stand by assistance Static Standing Balance Static Standing - Balance Support: No upper extremity supported;During functional activity Static Standing - Level of Assistance: 4: Min assist Static Standing - Comment/# of Minutes: stood at sink to wash hands; increased trunk flexion as balance compensation  End of Session PT - End of Session Equipment Utilized During Treatment: Gait belt Activity Tolerance: Patient tolerated treatment well Patient left: in chair;with call bell/phone within reach   GP     [facility] 08/27/2012, 11:35 AM Sheran Lawless, PT [phone] 08/27/2012

## 2012-08-28 LAB — GLUCOSE, CAPILLARY
Glucose-Capillary: 143 mg/dL — ABNORMAL HIGH (ref 70–99)
Glucose-Capillary: 129 mg/dL — ABNORMAL HIGH (ref 70–99)
Glucose-Capillary: 156 mg/dL — ABNORMAL HIGH (ref 70–99)
Glucose-Capillary: 125 mg/dL — ABNORMAL HIGH (ref 70–99)

## 2012-08-28 NOTE — Progress Notes (Signed)
Physical Therapy Treatment Patient Details Name: Austin Salas MRN: [id number] DOB: [birth year] Today's Date: 08/28/2012 Time: 4010-2725 PT Time Calculation (min): 16 min  PT Assessment / Plan / Recommendation Comments on Treatment Session  72 y/o male admitted with ICH s/p burr hole for evacuation.  He is progressing slowly with mobility.  Fatigues easily with gait.  Decreased balance even with RW use requiring min assist. Gait speed puts him at risk for falls at home.      Follow Up Recommendations  CIR     Does the patient have the potential to tolerate intense rehabilitation    yes  Barriers to Discharge   none      Equipment Recommendations  Rolling walker with 5" wheels    Recommendations for Other Services   none  Frequency Min 4X/week   Plan Discharge plan remains appropriate    Precautions / Restrictions Precautions Precautions: Fall   Pertinent Vitals/Pain See vitals flow sheet.     Mobility  Bed Mobility Sit to Supine: 5: Supervision;With rail;HOB flat Transfers Transfers: Sit to Stand;Stand to Sit Sit to Stand: 4: Min assist Stand to Sit: 4: Min assist Details for Transfer Assistance: min assist to support trunk for balance with transition of hands from armrails to assistive device.   Ambulation/Gait Ambulation/Gait Assistance: 4: Min assist Ambulation Distance (Feet): 190 Feet Assistive device: Rolling walker Ambulation/Gait Assistance Details: min assist to steady pt for balance.  Slow cautious gait speed Gait Pattern: Step-through pattern;Shuffle;Wide base of support Gait velocity: 1.07 ft/sec (<1.8 ft/sec indicates risk for recurrent falls).   General Gait Details: Tried to encourage pt to do a second lap around the unit.  He politely declined siting fatigue.       Acute Rehab PT Goals PT Goal: Sit to Stand - Progress: Progressing toward goal PT Goal: Ambulate - Progress: Progressing toward goal  Visit Information  Last PT Received On:  08/28/12 Assistance Needed: +1    Subjective Data  Subjective: Pt reports he had a HA earlier.  None now.     Cognition  Cognition Arousal/Alertness: Awake/alert Behavior During Therapy: WFL for tasks assessed/performed Overall Cognitive Status: Within Functional Limits for tasks assessed    Balance  Static Sitting Balance Static Sitting - Balance Support: Bilateral upper extremity supported;Feet supported Static Sitting - Level of Assistance: 5: Stand by assistance Static Standing Balance Static Standing - Balance Support: Bilateral upper extremity supported Static Standing - Level of Assistance: 4: Min assist Static Standing - Comment/# of Minutes: min assist with RW   End of Session PT - End of Session Activity Tolerance: Patient limited by fatigue Patient left: in bed;with call bell/phone within reach;with family/visitor present Nurse Communication: Mobility status     Carman Essick B. Breelynn Bankert, PT, DPT [phone]   08/28/2012, 4:23 PM

## 2012-08-28 NOTE — Progress Notes (Signed)
Chaplain visited with pt and his daughter while rounding in 73. Daughter feels pt is doing much better now and believes he will be moved from ICU later today. Pt speaking quite clearly. Chaplain offered words of encouragement.

## 2012-08-28 NOTE — Progress Notes (Signed)
Subjective: Patient reports Overall he is doing okay still has a headache although intermittently is better denies any nausea vomiting numbness and tingling seems to be tolerating a regular diet this  a.m.  Objective: Vital signs in last 24 hours: Temp:  [98.1 F (36.7 C)-98.6 F (37 C)] 98.4 F (36.9 C) (06/06 0400) Pulse Rate:  [54-87] 58 (06/06 0700) Resp:  [11-22] 17 (06/06 0700) BP: (115-175)/(41-79) 175/66 mmHg (06/06 0700) SpO2:  [95 %-100 %] 99 % (06/06 0700)  Intake/Output from previous day: 06/05 0701 - 06/06 0700 In: 1920 [I.V.:1800] Out: 2225 [Urine:2205; Drains:20] Intake/Output this shift:    Awake alert oriented strength 5 out of 5 wound clean and dry no pronator drift  Lab Results: No results found for this basename: WBC, HGB, HCT, PLT,  in the last 72 hours BMET No results found for this basename: NA, K, CL, CO2, GLUCOSE, BUN, CREATININE, CALCIUM,  in the last 72 hours  Studies/Results: Ct Head Wo Contrast  08/27/2012   *RADIOLOGY REPORT*  Clinical Data: 72 year old male status post craniotomy for intracranial hemorrhage.  Right parietal headache.  CT HEAD WITHOUT CONTRAST  Technique:  Contiguous axial images were obtained from the base of the skull through the vertex without contrast.  Comparison: 08/25/2012 and earlier.  Findings: Visualized paranasal sinuses and mastoids are clear. Stable visualized osseous structures.  Postoperative changes to the right scalp with skin staples and broad-based scalp hematoma. Visualized orbit soft tissues are within normal limits.  Calcified atherosclerosis at the skull base.  Right subdural drain remains in place.  Small volume pneumocephalus similar to the prior study.  Residual mixed density right subdural hematoma appears mildly decreased, now measuring up to 14 mm in thickness (previously 16 mm).  Leftward midline shift now is 10-11 mm (previously 12 mm).  Continued mass effect on the right lateral ventricle.  No  ventriculomegaly.  Basilar cisterns remain patent. Stable gray-white matter differentiation throughout the brain.  No evidence of cortically based acute infarction identified.  No new intracranial hemorrhage.  IMPRESSION: 1.  Sequelae of right craniotomy and subdural drain remains in place.  Mildly decreased mixed density right subdural hematoma, now up to 14 mm in thickness. 2.  Mildly decreased leftward midline shift, now 10 - 11 mm. 3.  No new intracranial abnormality.   Original Report Authenticated By: Erskine Speed, M.D.    Assessment/Plan: Rochele Pages out a J-P drain will continue to mobilize with physical and occupational therapy patient is being seen by rehabilitation transfer this afternoon possible  LOS: 9 days     Arelia Volpe P 08/28/2012, 7:51 AM

## 2012-08-28 NOTE — Progress Notes (Signed)
Occupational Therapy Treatment Patient Details Name: Austin Salas MRN: [id number] DOB: 04-22-1940 Today's Date: 08/28/2012 Time: 2130-8657 OT Time Calculation (min): 16 min  OT Assessment / Plan / Recommendation Comments on Treatment Session Pt making progress toward goals.  Continues to be a good CIR candidate.    Follow Up Recommendations  CIR    Barriers to Discharge       Equipment Recommendations  3 in 1 bedside comode    Recommendations for Other Services Rehab consult  Frequency Min 2X/week   Plan Discharge plan remains appropriate    Precautions / Restrictions Precautions Precautions: Fall   Pertinent Vitals/Pain See vitals    ADL  Grooming: Performed;Wash/dry face;Set up Where Assessed - Grooming: Unsupported sitting Toilet Transfer: Simulated;Minimal assistance Toilet Transfer Method:  (ambulating) Toilet Transfer Equipment:  (bed to chair) Equipment Used: Gait belt;Rolling walker Transfers/Ambulation Related to ADLs: Min guard with RW ambulating in room.  Requiring min assist x1 due to LOB (rocking back on heels) ADL Comments: Requiring a little more encouragement today to participate in OOB mobility.    OT Diagnosis:    OT Problem List:   OT Treatment Interventions:     OT Goals ADL Goals Pt Will Perform Grooming: with modified independence;Standing at sink ADL Goal: Grooming - Progress: Progressing toward goals Pt Will Transfer to Toilet: with modified independence;Ambulation;with DME;Comfort height toilet ADL Goal: Toilet Transfer - Progress: Progressing toward goals  Visit Information  Last OT Received On: 08/28/12 Assistance Needed: +1    Subjective Data      Prior Functioning       Cognition  Cognition Arousal/Alertness: Awake/alert Behavior During Therapy: WFL for tasks assessed/performed Overall Cognitive Status: Within Functional Limits for tasks assessed    Mobility  Bed Mobility Bed Mobility: Supine to Sit;Sitting - Scoot to  Edge of Bed Supine to Sit: 4: Min assist;With rails;HOB elevated Sitting - Scoot to [place] of Bed: 4: Min assist Transfers Transfers: Sit to Stand;Stand to Sit Sit to Stand: 4: Min assist;From bed;With upper extremity assist Stand to Sit: 4: Min assist;To chair/3-in-1;With upper extremity assist;With armrests Details for Transfer Assistance: Assist for steadying upon initally standing due to LOB posteriorly.  Pt leaning against bed with LEs.  Also assist to control descent to chair.    Exercises      Balance Balance Balance Assessed: Yes Static Standing Balance Static Standing - Balance Support: Bilateral upper extremity supported Static Standing - Level of Assistance: 4: Min assist Static Standing - Comment/# of Minutes: Bil UE supported on RW. Assist for balance due to posterior sway (rocked back on heels).   End of Session OT - End of Session Equipment Utilized During Treatment: Gait belt Activity Tolerance: Patient limited by fatigue Patient left: in chair;with call bell/phone within reach Nurse Communication: Mobility status  GO   08/28/2012 Cipriano Mile OTR/L Pager [phone] Office [phone]   Cipriano Mile 08/28/2012, 1:03 PM

## 2012-08-28 NOTE — Progress Notes (Signed)
I await insurance decision today to be able to admit pt to inpt rehab today. [employer] of [place] is primary insurance, not [employer] [phone]

## 2012-08-28 NOTE — Progress Notes (Signed)
[employer] of [place] has denied request to admit pt to inpt rehab. I have discussed with patient and he is aware. I will contact his wife and then discuss with SW to pursue SNF rehab placement. [phone]

## 2012-08-28 NOTE — Clinical Social Work Note (Addendum)
Clinical Social Work Department CLINICAL SOCIAL WORK PLACEMENT NOTE 08/28/2012  Patient:  KAELIN, BONELLI  Account Number:  [id number] Admit date:  08/19/2012  Clinical Social Worker:  Macario Golds, LCSW  Date/time:  08/28/2012 05:00 PM  Clinical Social Work is seeking post-discharge placement for this patient at the following level of care:   SKILLED NURSING   (*CSW will update this form in Epic as items are completed)   08/28/2012  Patient/family provided with [facility] Department of Clinical Social Work's list of facilities offering this level of care within the geographic area requested by the patient (or if unable, by the patient's family).  08/28/2012  Patient/family informed of their freedom to choose among providers that offer the needed level of care, that participate in Medicare, Medicaid or managed care program needed by the patient, have an available bed and are willing to accept the patient.  08/28/2012  Patient/family informed of [facility]' ownership interest in [facility], as well as of the fact that they are under no obligation to receive care at this facility.  PASARR submitted to EDS on 08/28/2012 PASARR number received from EDS on 08/28/2012  FL2 transmitted to all facilities in geographic area requested by pt/family on  08/28/2012 FL2 transmitted to all facilities within larger geographic area on   Patient informed that his/her managed care company has contracts with or will negotiate with  certain facilities, including the following:     Patient/family informed of bed offers received:  08/30/2012 ([facility]) and 08/31/2012 ([facility]) Patient chooses bed at  Physician recommends and patient chooses bed at    Patient to be transferred to  on  [facility] Patient to be transferred to facility by   The following physician request were entered in Epic:   Additional Comments: 06/06 Preference is for [facility]  Dede Query, MSW,  LCSW Weekend Coverage

## 2012-08-28 NOTE — Clinical Social Work Note (Signed)
Clinical Social Work Department BRIEF PSYCHOSOCIAL ASSESSMENT 08/28/2012  Patient:  Austin Salas, Austin Salas     Account Number:  [id number]     Admit date:  08/19/2012  Clinical Social Worker:  Verl Blalock  Date/Time:  08/28/2012 05:10 PM  Referred by:  RN  Date Referred:  08/28/2012 Referred for  SNF Placement   Other Referral:   Interview type:  Patient Other interview type:   Patient wife over the phone and patient daughter at bedside    PSYCHOSOCIAL DATA Living Status:  WIFE Admitted from facility:   Level of care:   Primary support name:  Abhishek, Levesque   [phone] Primary support relationship to patient:  SPOUSE Degree of support available:   Strong - just physical limitations    CURRENT CONCERNS Current Concerns  Post-Acute Placement   Other Concerns:    SOCIAL WORK ASSESSMENT / PLAN Clinical Social Worker met with patient at bedside to offer support and discuss patient needs at discharge.  Patient and patient daughter state that they were aware of inpatient rehab denial and are prepared for SNF search. Patient lives at home with his wife who is battling breast cancer and is unable to physically assist.  Patient plans to further discuss discharge plans with family, however is agreeable to SNF search in [place] with preference to [facility].  CSW has initiated search and will follow up with bed offers once available.  CSW remains available for support and to facilitate patient discharge needs once medically ready.    Patient and patient family aware that insurance provider may not approve SNF placement either and would have to return home with [facility] or outpatient follow up.   Assessment/plan status:  Psychosocial Support/Ongoing Assessment of Needs Other assessment/ plan:   Information/referral to community resources:   Visual merchandiser provided with options for discharge needs and information regarding all avenues.    PATIENT'S/FAMILY'S RESPONSE  TO PLAN OF CARE: Patient alert and oriented x3 but slow to respond at times. Patient and patient family very unhappy with insurance coverage and inpatient rehab denial but understand that patient will have needs at discharge.  Patient daughter works in the SNF environment and prefers patient to go home but will further discuss.  Patient family made mention to RN that patient would prefer to return home with outpatient follow up - CSW to further explore.

## 2012-08-29 LAB — GLUCOSE, CAPILLARY
Glucose-Capillary: 174 mg/dL — ABNORMAL HIGH (ref 70–99)
Glucose-Capillary: 117 mg/dL — ABNORMAL HIGH (ref 70–99)

## 2012-08-29 NOTE — Progress Notes (Signed)
Patient ID: Austin Salas, male   DOB: 1940-12-29, 72 y.o.   MRN: [id number] BP 156/60  Pulse 56  Temp(Src) 98.1 F (36.7 C) (Oral)  Resp 14  Ht 6' (1.829 m)  Wt 117.4 kg (258 lb 13.1 oz)  BMI 35.09 kg/m2  SpO2 98% Alert and oriented x 4 Perrl, full eom Left pronator drift, weakness on left side Dressing dry and intact Will leave in unit for now due to neuro exam.

## 2012-08-30 LAB — GLUCOSE, CAPILLARY

## 2012-08-30 NOTE — Clinical Social Work Note (Signed)
Clinical Social Work   CSW spoke with pt's daughter and wife regarding discharge plan. CSW provided only current bed offer. Per pt's daughter, she will talk with pt on discharge plan. Pt will either discharge to SNF, if [employer]BS approves, or discharge home with PT follow up. Weekday CSW will follow up with additional bed offers and possibly initiate [employer] prior auth. CSW will continue to follow.   Dede Query, MSW, LCSW Clinical Social Worker Weekend Coverage

## 2012-08-30 NOTE — Progress Notes (Signed)
Patient ID: Austin Salas, male   DOB: October 15, 1940, 72 y.o.   MRN: [id number] BP 149/57  Pulse 56  Temp(Src) 98.4 F (36.9 C) (Oral)  Resp 18  Ht 6' (1.829 m)  Wt 117.4 kg (258 lb 13.1 oz)  BMI 35.09 kg/m2  SpO2 99% Alert and oriented x 4, speech is clear, fluent Perrl, full eom Symmetric facies Tongue and uvula are in the midling Weakness left upper extremity Wound is clean, dry, and without signs of infection Exam remains stable. Will need placement

## 2012-08-31 LAB — GLUCOSE, CAPILLARY
Glucose-Capillary: 168 mg/dL — ABNORMAL HIGH (ref 70–99)
Glucose-Capillary: 107 mg/dL — ABNORMAL HIGH (ref 70–99)
Glucose-Capillary: 171 mg/dL — ABNORMAL HIGH (ref 70–99)

## 2012-08-31 NOTE — Progress Notes (Addendum)
CSW followed up with pt at bedside re: disposition planning.  CSW discussed current bed offers and pt asked CSW to contact pt daughter, but thinks that pt daughter is interested in [facility] and [address], but wants CSW to confirm with pt daughter.  CSW contacted pt daughter via telephone. Pt daughter confirmed that pt family would like to try [facility] and Rehab since it appears to be only SNF option at this time.   CSW contacted [facility] and left messages with both admissions coordinators and awaiting return phone call in order to get clinicals to facility to initiate [employer] authorization.   CSW to continue to follow and assist with disposition planning.  Addendum:   CSW received return phone call from pt daughter, Latina. Pt daughter asked CSW to send pt information to [facility] in [place], [place] as pt primary doctors round at that facility.  CSW initiated search to [facility] in [place] and spoke with admission coordinator who is reviewing information.  Await response re: bed offer in order for pt family to make final decision and insurance authorization for SNF to be initiated.   Addendum 12:40pm:  CSW received notification from [facility] that facility is able to offer pt a bed.  CSW contacted pt daughter, Latina and pt daughter confirmed choice for [facility].  CSW faxed additional clinical information to [facility] in order to initiate SNF insurance authorization.  CSW to await response from insurance re: authorization for SNF.  CSW to continue to follow.  Addendum 4:40pm:  CSW received notification from [facility] and pt insurance will not contract with that facility for placement for pt for rehab. CSW to follow up with pt family tomorrow re: disposition plan.   Jacklynn Lewis, MSW, LCSWA  Clinical Social Work Coverage [phone]

## 2012-08-31 NOTE — Progress Notes (Signed)
Patient ID: Austin Salas, male   DOB: April 07, 1940, 72 y.o.   MRN: [id number] Appears stable. Incision is clean dry and intact. He is awake and alert. He follows commands. He moves all extremities. Awaiting SNF

## 2012-08-31 NOTE — Progress Notes (Signed)
Physical Therapy Treatment Patient Details Name: Austin Salas MRN: [id number] DOB: 1940/09/25 Today's Date: 08/31/2012 Time: 4098-1191 PT Time Calculation (min): 24 min  PT Assessment / Plan / Recommendation Comments on Treatment Session  Pt limited by poor activity tolerance but with improved balance today.  Able to perform stepping exercises in all directions with min A.  Continues to be at risk for falls due to slow gait speed and delayed balance reactions.    Follow Up Recommendations  CIR;SNF     Does the patient have the potential to tolerate intense rehabilitation     Barriers to Discharge        Equipment Recommendations       Recommendations for Other Services    Frequency Min 4X/week   Plan Discharge plan remains appropriate    Precautions / Restrictions Precautions Precautions: Fall Restrictions Weight Bearing Restrictions: No   Pertinent Vitals/Pain No c/o pain    Mobility  Bed Mobility Supine to Sit: 5: Supervision;HOB elevated Sit to Supine: 5: Supervision;HOB flat Details for Bed Mobility Assistance: increased time Transfers Sit to Stand: 4: Min guard Stand to Sit: 4: Min guard Stand Pivot Transfers: 4: Min assist Details for Transfer Assistance: Min A for balance reactions, cues for safety with RW during SPT Ambulation/Gait Ambulation/Gait Assistance: 4: Min guard Ambulation Distance (Feet): 200 Feet Assistive device: Rolling walker Ambulation/Gait Assistance Details: min steadying assist, pt with slow gait speed, wide BOS    Exercises     PT Diagnosis:    PT Problem List:   PT Treatment Interventions:     PT Goals Acute Rehab PT Goals PT Goal: Supine/Side to Sit - Progress: Progressing toward goal PT Goal: Sit to Stand - Progress: Progressing toward goal PT Transfer Goal: Bed to Chair/Chair to Bed - Progress: Progressing toward goal PT Goal: Ambulate - Progress: Progressing toward goal PT Goal: Up/Down Stairs - Progress: Progressing  toward goal  Visit Information  Last PT Received On: 08/31/12 Assistance Needed: +1    Subjective Data  Subjective: "I'm tired, but I'll do it"   Cognition  Cognition Behavior During Therapy: WFL for tasks assessed/performed Overall Cognitive Status: Within Functional Limits for tasks assessed    Balance  Static Standing Balance Static Standing - Level of Assistance: 5: Stand by assistance;4: Min assist Static Standing - Comment/# of Minutes: 1 minute with UEs reaching up and laterally, pt requires close supervision-min guard Dynamic Standing Balance Dynamic Standing - Balance Support: No upper extremity supported Dynamic Standing - Level of Assistance: 4: Min assist Dynamic Standing - Balance Activities: Lateral lean/weight shifting;Forward lean/weight shifting Dynamic Standing - Comments: performed standing wt shifts and stepping fwd/bkwd/laterally with min A, pt with delayed but present balance reactions, pt guarded in movements, fatigues easily  End of Session PT - End of Session Equipment Utilized During Treatment: Gait belt Patient left: in bed;with call bell/phone within reach;with bed alarm set   GP     Julyanna Scholle 08/31/2012, 10:04 AM

## 2012-09-01 LAB — GLUCOSE, CAPILLARY
Glucose-Capillary: 128 mg/dL — ABNORMAL HIGH (ref 70–99)
Glucose-Capillary: 147 mg/dL — ABNORMAL HIGH (ref 70–99)
Glucose-Capillary: 235 mg/dL — ABNORMAL HIGH (ref 70–99)

## 2012-09-01 NOTE — Progress Notes (Signed)
Physical Therapy Treatment Patient Details Name: Austin Salas MRN: [id number] DOB: 11/03/1940 Today's Date: 09/01/2012 Time: 1434-1500 PT Time Calculation (min): 26 min  PT Assessment / Plan / Recommendation Comments on Treatment Session  Balance improving slowly. Slow response time cognitively as well as physically. Some diffiulty processing higher challenging activities. Agree with rehab prior to d/c home given his risk for falls. Patient aware that we did not use RW today for therapeutic reasons however needs to use it with nursing staff.     Follow Up Recommendations  SNF     Does the patient have the potential to tolerate intense rehabilitation     Barriers to Discharge        Equipment Recommendations  Rolling walker with 5" wheels    Recommendations for Other Services    Frequency Min 4X/week   Plan Discharge plan remains appropriate    Precautions / Restrictions Precautions Precautions: Fall Restrictions Weight Bearing Restrictions: No   Pertinent Vitals/Pain Denies pain    Mobility  Bed Mobility Bed Mobility: Not assessed Transfers Transfers: Sit to Stand;Stand to Sit Sit to Stand: 5: Supervision;With upper extremity assist;With armrests;From chair/3-in-1 Stand to Sit: 4: Min assist;5: Supervision;With upper extremity assist;To chair/3-in-1;With armrests Details for Transfer Assistance: cues for safe positioning prior sit<>stand, not payin great attention to his surroundings and he stubbed his toe on the stool losing his balance and sitting with uncontrolled descent to chair x1, on several more occasions he needed cues to attend to safety with stand=->sit Ambulation/Gait Ambulation/Gait Assistance: 4: Min guard Ambulation Distance (Feet): 300 Feet Assistive device: None Ambulation/Gait Assistance Details: ambulated without RW today to challenge dynamic stability, wide BOS with decreased gait speed and slow reaction time/response time to any challenges; one  seated rest after 100 ft Gait Pattern: Step-through pattern;Wide base of support;Decreased stride length General Gait Details: decreased step height    Exercises Other Exercises Other Exercises: high knees x10 with RUE supported and cues for slow controlled movement; mingaurdA Other Exercises: heel/toe rises x10 with RUE supported and mingaurdA; cues for technique    PT Goals Acute Rehab PT Goals PT Goal: Sit to Stand - Progress: Progressing toward goal PT Goal: Ambulate - Progress: Progressing toward goal  Visit Information  Last PT Received On: 09/01/12 Assistance Needed: +1    Subjective Data  Subjective: I feel OK.  Patient Stated Goal: home, bowling   Cognition  Cognition Arousal/Alertness: Awake/alert Behavior During Therapy: WFL for tasks assessed/performed Overall Cognitive Status: Impaired/Different from baseline Area of Impairment: Problem solving;Following commands Problem Solving: Slow processing;Requires tactile cues General Comments: for higher level dynamic balance activities he required tactile cues to sequence what I was asking him    Balance  Dynamic Standing Balance Dynamic Standing - Balance Activities: Reaching for objects;Forward lean/weight shifting;Lateral lean/weight shifting Dynamic Standing - Comments: performed lateral weight shifts with reaching outside BOS, pt wanting to hold on with unilateral upper extremity support for improved stability; performed min AP and lateral perturbations to challenge and improve stepping reaction response with cues for techinque; standing exercises at least 10 minutes of time  End of Session PT - End of Session Equipment Utilized During Treatment: Gait belt Activity Tolerance: Patient tolerated treatment well Patient left: in chair;with call bell/phone within reach Nurse Communication: Mobility status   GP     [facility] HELEN 09/01/2012, 3:16 PM

## 2012-09-01 NOTE — Progress Notes (Signed)
NUTRITION FOLLOW UP  Intervention:   Ensure Complete po BID, each supplement provides 350 kcal and 13 grams of protein.   NUTRITION DIAGNOSIS:  Inadequate oral intake related to decreased appetite as evidenced by meal completion < 50%; resolved.   Goal:  Pt to meet >/= 90% of their estimated nutrition needs; met.    Monitor:  PO intake, supplement acceptance, weight trend, labs   Assessment:   Pt hopeful for rehab admission soon. Pt states he had no weight loss and had good appetite PTA.  Pt reports better appetite and likes the ensure.   Height: Ht Readings from Last 1 Encounters:  08/19/12 6' (1.829 m)    Weight Status:   Wt Readings from Last 1 Encounters:  08/24/12 258 lb 13.1 oz (117.4 kg)  Admission weight 264 lb   Re-estimated needs:  Kcal: 2100-2300  Protein: 95-110 grams  Fluid: > 2.1 L/day  Skin: incision  Diet Order: Carb Control Meal Completion: 100%   Intake/Output Summary (Last 24 hours) at 09/01/12 1100 Last data filed at 09/01/12 0850  Gross per 24 hour  Intake   1185 ml  Output   2300 ml  Net  -1115 ml    Last BM: 6/5   Labs:  No results found for this basename: NA, K, CL, CO2, BUN, CREATININE, CALCIUM, MG, PHOS, GLUCOSE,  in the last 168 hours  CBG (last 3)   Recent Labs  08/31/12 1618 08/31/12 2129 09/01/12 0631  GLUCAP 107* 171* 128*    Scheduled Meds: . amLODipine  5 mg Oral Daily  . docusate sodium  100 mg Oral BID  . feeding supplement  237 mL Oral BID BM  . irbesartan  300 mg Oral Daily   And  . hydrochlorothiazide  25 mg Oral Daily  . insulin aspart  0-15 Units Subcutaneous TID WC  . levETIRAcetam  500 mg Oral BID  . pantoprazole  40 mg Oral Daily  . senna  1 tablet Oral BID    Continuous Infusions: . sodium chloride 0 mL/hr at 08/22/12 1000  . sodium chloride 75 mL/hr at 08/31/12 0529    Kendell Bane RD, LDN, CNSC [phone] Pager [phone] After Hours Pager

## 2012-09-02 LAB — GLUCOSE, CAPILLARY
Glucose-Capillary: 141 mg/dL — ABNORMAL HIGH (ref 70–99)
Glucose-Capillary: 112 mg/dL — ABNORMAL HIGH (ref 70–99)

## 2012-09-02 NOTE — Progress Notes (Signed)
Physical Therapy Treatment Patient Details Name: Austin Salas MRN: [id number] DOB: 06/22/1940 Today's Date: 09/02/2012 Time: 5784-6962 PT Time Calculation (min): 19 min  PT Assessment / Plan / Recommendation Comments on Treatment Session  Ambulated without RW again today. Ambulates with forward flexion and momentum and tends to trip trip forward.     Follow Up Recommendations  SNF     Does the patient have the potential to tolerate intense rehabilitation     Barriers to Discharge        Equipment Recommendations  Rolling walker with 5" wheels    Recommendations for Other Services    Frequency Min 3X/week   Plan Discharge plan remains appropriate;Frequency needs to be updated    Precautions / Restrictions Precautions Precautions: Fall Restrictions Weight Bearing Restrictions: No   Pertinent Vitals/Pain Denies pain    Mobility  Bed Mobility Bed Mobility: Not assessed Transfers Transfers: Sit to Stand;Stand to Sit Sit to Stand: 5: Supervision;With upper extremity assist;From chair/3-in-1;With armrests Stand to Sit: 5: Supervision;With upper extremity assist;To chair/3-in-1 (sit<>stand x2) Details for Transfer Assistance: cues for safe hand placement Ambulation/Gait Ambulation/Gait Assistance: 4: Min guard Ambulation Distance (Feet): 500 Feet Assistive device: None Ambulation/Gait Assistance Details: cues for tall posture and bigger steps, did not ambulate with RW to challenge dynamic stability Gait Pattern: Step-through pattern;Wide base of support;Decreased stride length General Gait Details: decreased step height bilaterally Stairs: No    Exercises General Exercises - Lower Extremity Toe Raises: AROM;Both;10 reps;Standing Heel Raises: AROM;Both;10 reps;Standing    PT Goals Acute Rehab PT Goals PT Goal: Sit to Stand - Progress: Progressing toward goal PT Transfer Goal: Bed to Chair/Chair to Bed - Progress: Progressing toward goal PT Goal: Ambulate -  Progress: Progressing toward goal  Visit Information  Last PT Received On: 09/02/12 Assistance Needed: +1    Subjective Data  Subjective: You walked me a lot further than yesterday.  Patient Stated Goal: home   Cognition  Cognition Arousal/Alertness: Awake/alert Behavior During Therapy: WFL for tasks assessed/performed Overall Cognitive Status: Impaired/Different from baseline Area of Impairment: Problem solving;Following commands Following Commands: Follows one step commands with increased time Problem Solving: Slow processing;Difficulty sequencing;Requires tactile cues General Comments: slow to respond    Balance  High Level Balance High Level Balance Comments: speed changes x3 with 10 second bouts of fast pace needing cues for bigger steps; ambulated 100 ft performing head turns on command needing closer gaurding for several staggers, patient needs to slow down significantly to perform  End of Session PT - End of Session Equipment Utilized During Treatment: Gait belt Activity Tolerance: Patient tolerated treatment well Patient left: in chair;with call bell/phone within reach Nurse Communication: Mobility status   GP     [facility] HELEN 09/02/2012, 4:05 PM

## 2012-09-02 NOTE — Progress Notes (Signed)
Occupational Therapy Treatment Patient Details Name: Austin Salas MRN: [id number] DOB: May 05, 1940 Today's Date: 09/02/2012 Time: 8469-6295 OT Time Calculation (min): 18 min  OT Assessment / Plan / Recommendation Comments on Treatment Session 72 yo male s/p burr hole evacuation that is progressing well. pt demonstrates cognitive deficits at an executive level. Recommend SNf for d/c planning at this time.    Follow Up Recommendations  SNF    Barriers to Discharge       Equipment Recommendations       Recommendations for Other Services    Frequency Min 2X/week   Plan Discharge plan needs to be updated    Precautions / Restrictions Precautions Precautions: Fall Restrictions Weight Bearing Restrictions: No   Pertinent Vitals/Pain No pain reported at this time    ADL  Grooming: Wash/dry hands;Supervision/safety Where Assessed - Grooming: Unsupported standing Toilet Transfer: Radiographer, therapeutic Method: Sit to Barista: Regular height toilet;Grab bars Toileting - Clothing Manipulation and Hygiene: Supervision/safety Where Assessed - Engineer, mining and Hygiene: Sit on 3-in-1 or toilet Equipment Used: Gait belt Transfers/Ambulation Related to ADLs: Pt ambulated to bathroom with slow gait supervision level ADL Comments: Pt agreeable to oob for lunch. pt completed toileting and hand hygiene then positioned in chair for lunch. pt able to scan environement and locate all necessary adl items. Pt given no v/c this sesion    OT Diagnosis:    OT Problem List:   OT Treatment Interventions:     OT Goals Acute Rehab OT Goals OT Goal Formulation: With patient Time For Goal Achievement: 09/10/12 Potential to Achieve Goals: Good ADL Goals Pt Will Perform Grooming: with modified independence;Standing at sink ADL Goal: Grooming - Progress: Progressing toward goals Pt Will Perform Upper Body Bathing: with modified  independence;Sitting, chair;Sitting, edge of bed ADL Goal: Upper Body Bathing - Progress: Progressing toward goals Pt Will Perform Lower Body Bathing: with modified independence;Sit to stand from chair;Sit to stand from bed ADL Goal: Lower Body Bathing - Progress: Progressing toward goals Pt Will Perform Upper Body Dressing: with modified independence;Sitting, bed;Sitting, chair ADL Goal: Upper Body Dressing - Progress: Progressing toward goals Pt Will Perform Lower Body Dressing: with modified independence;Sit to stand from chair;Sit to stand from bed ADL Goal: Lower Body Dressing - Progress: Progressing toward goals Pt Will Transfer to Toilet: with modified independence;Ambulation;with DME;Comfort height toilet ADL Goal: Toilet Transfer - Progress: Progressing toward goals Pt Will Perform Toileting - Clothing Manipulation: with modified independence;Standing;Sitting on 3-in-1 or toilet ADL Goal: Toileting - Clothing Manipulation - Progress: Progressing toward goals Pt Will Perform Toileting - Hygiene: with modified independence;Sit to stand from 3-in-1/toilet ADL Goal: Toileting - Hygiene - Progress: Progressing toward goals Pt Will Perform Tub/Shower Transfer: Shower transfer;with modified independence;Ambulation;with DME  Visit Information  Last OT Received On: 09/02/12 Assistance Needed: +1    Subjective Data      Prior Functioning       Cognition  Cognition Arousal/Alertness: Awake/alert Behavior During Therapy: [facility] for tasks assessed/performed General Comments: Pt able to recall admission on May 28 and current date as June 11. pt unable to recall which side of the bed pt exited PTA . pt exiting the bed on left side this sesion    Mobility  Bed Mobility Bed Mobility: Supine to Sit;Sitting - Scoot to Edge of Bed Supine to Sit: 6: Modified independent (Device/Increase time);HOB flat Sitting - Scoot to Edge of Bed: 6: Modified independent (Device/Increase  time) Transfers Transfers: Sit to Stand;Stand to  Sit Sit to Stand: 5: Supervision;With upper extremity assist;From bed Stand to Sit: 5: Supervision;With upper extremity assist;To chair/3-in-1    Exercises      Balance     End of Session OT - End of Session Activity Tolerance: Patient tolerated treatment well Patient left: in chair;with call bell/phone within reach Nurse Communication: Mobility status;Precautions  GO     Lucile Shutters 09/02/2012, 11:34 AM Pager: [phone]

## 2012-09-02 NOTE — Progress Notes (Signed)
Patient ID: Austin Salas, male   DOB: [birth year], 72 y.o.   MRN: [id number] Patient is stable. He denies headache. He did well in therapy today. He still has higher executive cognitive issues. He moves all extremities. He follows commands. He is awake and alert and conversant. Awaiting SNF.

## 2012-09-03 LAB — GLUCOSE, CAPILLARY: Glucose-Capillary: 122 mg/dL — ABNORMAL HIGH (ref 70–99)

## 2012-09-03 MED ORDER — LEVETIRACETAM 500 MG PO TABS: 500.0000 mg | ORAL_TABLET | Freq: Two times a day (BID) | ORAL | Status: DC

## 2012-09-03 MED ORDER — HYDROCODONE-ACETAMINOPHEN 5-325 MG PO TABS: 1.0000 | ORAL_TABLET | ORAL | Status: DC | PRN

## 2012-09-03 NOTE — Care Management Note (Signed)
    Page 1 of 2   09/03/2012     3:55:30 PM   CARE MANAGEMENT NOTE 09/03/2012  Patient:  Austin Salas, Austin Salas   Account Number:  [id number]  Date Initiated:  09/03/2012  Documentation initiated by:  Elmer Bales  Subjective/Objective Assessment:   Pt admitted with right subdural hematoma.  Pt lives at home with wife.  Has good support from friends and family.     Action/Plan:   Will follow for discharge needs, HH vs SNF   Anticipated DC Date:  09/03/2012   Anticipated DC Plan:  HOME/SELF CARE  In-house referral  Clinical Social Worker      DC Planning Services  CM consult      Choice offered to / List presented to:  C-4 Adult Children   DME arranged  [facility]      DME agency  [facility]     HH arranged  HH-2 PT  HH-3 OT      [facility] agency  [facility]   Status of service:  Completed, signed off Medicare Important Message given?   (If response is "NO", the following Medicare IM given date fields will be blank) Date Medicare IM given:   Date Additional Medicare IM given:    Discharge Disposition:  HOME W HOME HEALTH SERVICES  Per UR Regulation:  Reviewed for med. necessity/level of care/duration of stay  If discussed at Long Length of Stay Meetings, dates discussed:    Comments:  09/03/12 1545  Elmer Bales RN, MSN CM-  Met with patient, wife and daughter to discuss home health options. Pt's daughter chose [facility].  Spoke with Eber Jones with [facility], who stated they are unable to staff the therapy for greater than one week.  Pt's daughter gave permission to contact [facility] as second choice.  Spoke with [facility] with [facility], who has accepted the referral.  Pt's family updated.   09/03/12 1420  Elmer Bales RN, MSN CM-  Spoke to patient's wife to verify that patient's discharge orders have been written.  Still awaiting call from daughter regarding agency choice.  Wife states that she has also left a message  with daughter regarding this.   09/03/12 1145 Elmer Bales RN, MSN CM- Spoke with patient's wife to discuss home care.  Pt's wife is agreeable to this plan.  Awaiting call back from patient's daughter to select home care agency, per wife's request. Pt's RN Lissa Hoard was notified to contact the ordering physician to request a discharge home with [facility] PT/OT order.   09/03/12 1020 Elmer Bales RN, MSN CM- Met with patient to discuss discharge options. Per CSW, patient and family are considering [facility] PT/OT rather than SNF placement.  Pt verified to CM that he is interested in this option and gave permission to call his wife Fulton Mole at [phone] and daughter Melrose Nakayama at [phone].  Messages were left at both numbers.  Pt is aware that if home health is the option he chooses, Dr Wynetta Emery will be notified of need for orders.  Will continue to follow.

## 2012-09-03 NOTE — Discharge Summary (Signed)
Physician Discharge Summary  Patient ID: Austin Salas MRN: [id number] DOB/AGE: 06/05/1940 72 y.o.  Admit date: 08/19/2012 Discharge date: 09/03/2012  Admission Diagnoses: Right subdural hematoma    Discharge Diagnoses: Same   Discharged Condition: stable  Hospital Course: The patient was admitted on 08/19/2012 and taken to the operating room where the patient underwent right craniotomy for subdural hematoma. The patient tolerated the procedure well and was taken to the recovery room and then to the ICU in stable condition. The hospital course was routine. There were no complications. The wound remained clean dry and intact. Pt had appropriate mild headache. No complaints of visual changes or new N/T/W. The patient remained afebrile with stable vital signs, and tolerated a regular diet. The patient continued to increase activities, and pain was well controlled with oral pain medications. He continued to work with physical and occupational therapy, and was becoming more mobile but still had higher executive cognitive function difficulties. He was discharged to a SNF on 09/03/2012. Consults: None  Significant Diagnostic Studies:  Results for orders placed during the hospital encounter of 08/19/12  MRSA PCR SCREENING      Result Value Range   MRSA by PCR NEGATIVE  NEGATIVE  BASIC METABOLIC PANEL      Result Value Range   Sodium 136  135 - 145 mEq/L   Potassium 5.2 (*) 3.5 - 5.1 mEq/L   Chloride 106  96 - 112 mEq/L   CO2 20  19 - 32 mEq/L   Glucose, Bld 156 (*) 70 - 99 mg/dL   BUN 42 (*) 6 - 23 mg/dL   Creatinine, Ser 4.09 (*) 0.50 - 1.35 mg/dL   Calcium 8.9  8.4 - 81.1 mg/dL   GFR calc non Af Amer 41 (*) >90 mL/min   GFR calc Af Amer 47 (*) >90 mL/min  CBC      Result Value Range   WBC 8.5  4.0 - 10.5 K/uL   RBC 3.74 (*) 4.22 - 5.81 MIL/uL   Hemoglobin 10.9 (*) 13.0 - 17.0 g/dL   HCT 91.4 (*) 78.2 - 95.6 %   MCV 88.8  78.0 - 100.0 fL   MCH 29.1  26.0 - 34.0 pg   MCHC 32.8   30.0 - 36.0 g/dL   RDW 21.3  08.6 - 57.8 %   Platelets 148 (*) 150 - 400 K/uL  GLUCOSE, CAPILLARY      Result Value Range   Glucose-Capillary 113 (*) 70 - 99 mg/dL  FERRITIN      Result Value Range   Ferritin 456 (*) 22 - 322 ng/mL  BASIC METABOLIC PANEL      Result Value Range   Sodium 133 (*) 135 - 145 mEq/L   Potassium 6.2 (*) 3.5 - 5.1 mEq/L   Chloride 105  96 - 112 mEq/L   CO2 18 (*) 19 - 32 mEq/L   Glucose, Bld 160 (*) 70 - 99 mg/dL   BUN 38 (*) 6 - 23 mg/dL   Creatinine, Ser 4.69 (*) 0.50 - 1.35 mg/dL   Calcium 9.1  8.4 - 62.9 mg/dL   GFR calc non Af Amer 40 (*) >90 mL/min   GFR calc Af Amer 47 (*) >90 mL/min  BASIC METABOLIC PANEL      Result Value Range   Sodium 135  135 - 145 mEq/L   Potassium 5.0  3.5 - 5.1 mEq/L   Chloride 103  96 - 112 mEq/L   CO2 19  19 - 32  mEq/L   Glucose, Bld 194 (*) 70 - 99 mg/dL   BUN 37 (*) 6 - 23 mg/dL   Creatinine, Ser 1.61 (*) 0.50 - 1.35 mg/dL   Calcium 9.3  8.4 - 09.6 mg/dL   GFR calc non Af Amer 38 (*) >90 mL/min   GFR calc Af Amer 45 (*) >90 mL/min  GLUCOSE, CAPILLARY      Result Value Range   Glucose-Capillary 125 (*) 70 - 99 mg/dL  GLUCOSE, CAPILLARY      Result Value Range   Glucose-Capillary 180 (*) 70 - 99 mg/dL  BASIC METABOLIC PANEL      Result Value Range   Sodium 136  135 - 145 mEq/L   Potassium 5.3 (*) 3.5 - 5.1 mEq/L   Chloride 103  96 - 112 mEq/L   CO2 21  19 - 32 mEq/L   Glucose, Bld 187 (*) 70 - 99 mg/dL   BUN 37 (*) 6 - 23 mg/dL   Creatinine, Ser 0.45 (*) 0.50 - 1.35 mg/dL   Calcium 9.1  8.4 - 40.9 mg/dL   GFR calc non Af Amer 38 (*) >90 mL/min   GFR calc Af Amer 44 (*) >90 mL/min  CBC      Result Value Range   WBC 11.6 (*) 4.0 - 10.5 K/uL   RBC 4.04 (*) 4.22 - 5.81 MIL/uL   Hemoglobin 11.9 (*) 13.0 - 17.0 g/dL   HCT 81.1 (*) 91.4 - 78.2 %   MCV 89.6  78.0 - 100.0 fL   MCH 29.5  26.0 - 34.0 pg   MCHC 32.9  30.0 - 36.0 g/dL   RDW 95.6  21.3 - 08.6 %   Platelets 145 (*) 150 - 400 K/uL  GLUCOSE,  CAPILLARY      Result Value Range   Glucose-Capillary 144 (*) 70 - 99 mg/dL  GLUCOSE, CAPILLARY      Result Value Range   Glucose-Capillary 148 (*) 70 - 99 mg/dL   Comment 1 Notify RN     Comment 2 Documented in Chart    GLUCOSE, CAPILLARY      Result Value Range   Glucose-Capillary 160 (*) 70 - 99 mg/dL  BASIC METABOLIC PANEL      Result Value Range   Sodium 136  135 - 145 mEq/L   Potassium 4.9  3.5 - 5.1 mEq/L   Chloride 102  96 - 112 mEq/L   CO2 22  19 - 32 mEq/L   Glucose, Bld 183 (*) 70 - 99 mg/dL   BUN 33 (*) 6 - 23 mg/dL   Creatinine, Ser 5.78 (*) 0.50 - 1.35 mg/dL   Calcium 9.2  8.4 - 46.9 mg/dL   GFR calc non Af Amer 45 (*) >90 mL/min   GFR calc Af Amer 52 (*) >90 mL/min  NA AND K (SODIUM & POTASSIUM), RAND UR      Result Value Range   Sodium, Ur 67     Potassium Urine Timed 48    CREATININE, URINE, RANDOM      Result Value Range   Creatinine, Urine 144.01    GLUCOSE, CAPILLARY      Result Value Range   Glucose-Capillary 128 (*) 70 - 99 mg/dL  BASIC METABOLIC PANEL      Result Value Range   Sodium 134 (*) 135 - 145 mEq/L   Potassium 5.0  3.5 - 5.1 mEq/L   Chloride 100  96 - 112 mEq/L   CO2 22  19 -  32 mEq/L   Glucose, Bld 163 (*) 70 - 99 mg/dL   BUN 29 (*) 6 - 23 mg/dL   Creatinine, Ser 1.61 (*) 0.50 - 1.35 mg/dL   Calcium 9.3  8.4 - 09.6 mg/dL   GFR calc non Af Amer 48 (*) >90 mL/min   GFR calc Af Amer 56 (*) >90 mL/min  MAGNESIUM      Result Value Range   Magnesium 1.6  1.5 - 2.5 mg/dL  PHOSPHORUS      Result Value Range   Phosphorus 2.5  2.3 - 4.6 mg/dL  CBC      Result Value Range   WBC 10.0  4.0 - 10.5 K/uL   RBC 4.17 (*) 4.22 - 5.81 MIL/uL   Hemoglobin 12.4 (*) 13.0 - 17.0 g/dL   HCT 04.5 (*) 40.9 - 81.1 %   MCV 89.7  78.0 - 100.0 fL   MCH 29.7  26.0 - 34.0 pg   MCHC 33.2  30.0 - 36.0 g/dL   RDW 91.4  78.2 - 95.6 %   Platelets 136 (*) 150 - 400 K/uL  GLUCOSE, CAPILLARY      Result Value Range   Glucose-Capillary 149 (*) 70 - 99 mg/dL    Comment 1 Notify RN     Comment 2 Documented in Chart    GLUCOSE, CAPILLARY      Result Value Range   Glucose-Capillary 150 (*) 70 - 99 mg/dL   Comment 1 Notify RN     Comment 2 Documented in Chart    GLUCOSE, CAPILLARY      Result Value Range   Glucose-Capillary 148 (*) 70 - 99 mg/dL  GLUCOSE, CAPILLARY      Result Value Range   Glucose-Capillary 153 (*) 70 - 99 mg/dL  CBC      Result Value Range   WBC 11.0 (*) 4.0 - 10.5 K/uL   RBC 4.24  4.22 - 5.81 MIL/uL   Hemoglobin 12.5 (*) 13.0 - 17.0 g/dL   HCT 21.3 (*) 08.6 - 57.8 %   MCV 88.9  78.0 - 100.0 fL   MCH 29.5  26.0 - 34.0 pg   MCHC 33.2  30.0 - 36.0 g/dL   RDW 46.9  62.9 - 52.8 %   Platelets 159  150 - 400 K/uL  BASIC METABOLIC PANEL      Result Value Range   Sodium 134 (*) 135 - 145 mEq/L   Potassium 4.6  3.5 - 5.1 mEq/L   Chloride 97  96 - 112 mEq/L   CO2 23  19 - 32 mEq/L   Glucose, Bld 160 (*) 70 - 99 mg/dL   BUN 29 (*) 6 - 23 mg/dL   Creatinine, Ser 4.13 (*) 0.50 - 1.35 mg/dL   Calcium 9.7  8.4 - 24.4 mg/dL   GFR calc non Af Amer 50 (*) >90 mL/min   GFR calc Af Amer 58 (*) >90 mL/min  GLUCOSE, CAPILLARY      Result Value Range   Glucose-Capillary 233 (*) 70 - 99 mg/dL  GLUCOSE, CAPILLARY      Result Value Range   Glucose-Capillary 155 (*) 70 - 99 mg/dL  GLUCOSE, CAPILLARY      Result Value Range   Glucose-Capillary 157 (*) 70 - 99 mg/dL  GLUCOSE, CAPILLARY      Result Value Range   Glucose-Capillary 142 (*) 70 - 99 mg/dL  BASIC METABOLIC PANEL      Result Value Range   Sodium 135  135 - 145 mEq/L   Potassium 5.0  3.5 - 5.1 mEq/L   Chloride 96  96 - 112 mEq/L   CO2 24  19 - 32 mEq/L   Glucose, Bld 168 (*) 70 - 99 mg/dL   BUN 31 (*) 6 - 23 mg/dL   Creatinine, Ser 8.11 (*) 0.50 - 1.35 mg/dL   Calcium 9.8  8.4 - 91.4 mg/dL   GFR calc non Af Amer 48 (*) >90 mL/min   GFR calc Af Amer 56 (*) >90 mL/min  CBC      Result Value Range   WBC 8.9  4.0 - 10.5 K/uL   RBC 4.07 (*) 4.22 - 5.81 MIL/uL    Hemoglobin 12.0 (*) 13.0 - 17.0 g/dL   HCT 78.2 (*) 95.6 - 21.3 %   MCV 88.7  78.0 - 100.0 fL   MCH 29.5  26.0 - 34.0 pg   MCHC 33.2  30.0 - 36.0 g/dL   RDW 08.6  57.8 - 46.9 %   Platelets 217  150 - 400 K/uL  GLUCOSE, CAPILLARY      Result Value Range   Glucose-Capillary 129 (*) 70 - 99 mg/dL  GLUCOSE, CAPILLARY      Result Value Range   Glucose-Capillary 155 (*) 70 - 99 mg/dL  GLUCOSE, CAPILLARY      Result Value Range   Glucose-Capillary 156 (*) 70 - 99 mg/dL  GLUCOSE, CAPILLARY      Result Value Range   Glucose-Capillary 163 (*) 70 - 99 mg/dL  GLUCOSE, CAPILLARY      Result Value Range   Glucose-Capillary 164 (*) 70 - 99 mg/dL  BASIC METABOLIC PANEL      Result Value Range   Sodium 132 (*) 135 - 145 mEq/L   Potassium 4.9  3.5 - 5.1 mEq/L   Chloride 98  96 - 112 mEq/L   CO2 25  19 - 32 mEq/L   Glucose, Bld 142 (*) 70 - 99 mg/dL   BUN 40 (*) 6 - 23 mg/dL   Creatinine, Ser 6.29 (*) 0.50 - 1.35 mg/dL   Calcium 9.0  8.4 - 52.8 mg/dL   GFR calc non Af Amer 36 (*) >90 mL/min   GFR calc Af Amer 42 (*) >90 mL/min  GLUCOSE, CAPILLARY      Result Value Range   Glucose-Capillary 117 (*) 70 - 99 mg/dL   Comment 1 Notify RN     Comment 2 Documented in Chart    GLUCOSE, CAPILLARY      Result Value Range   Glucose-Capillary 135 (*) 70 - 99 mg/dL  GLUCOSE, CAPILLARY      Result Value Range   Glucose-Capillary 128 (*) 70 - 99 mg/dL  GLUCOSE, CAPILLARY      Result Value Range   Glucose-Capillary 116 (*) 70 - 99 mg/dL  GLUCOSE, CAPILLARY      Result Value Range   Glucose-Capillary 159 (*) 70 - 99 mg/dL   Comment 1 Notify RN     Comment 2 Documented in Chart    GLUCOSE, CAPILLARY      Result Value Range   Glucose-Capillary 126 (*) 70 - 99 mg/dL   Comment 1 Notify RN     Comment 2 Documented in Chart    GLUCOSE, CAPILLARY      Result Value Range   Glucose-Capillary 114 (*) 70 - 99 mg/dL  GLUCOSE, CAPILLARY      Result Value Range   Glucose-Capillary 146 (*) 70 - 99 mg/dL  GLUCOSE, CAPILLARY      Result Value Range   Glucose-Capillary 121 (*) 70 - 99 mg/dL   Comment 1 Notify RN     Comment 2 Documented in Chart    GLUCOSE, CAPILLARY      Result Value Range   Glucose-Capillary 135 (*) 70 - 99 mg/dL   Comment 1 Notify RN     Comment 2 Documented in Chart    GLUCOSE, CAPILLARY      Result Value Range   Glucose-Capillary 126 (*) 70 - 99 mg/dL  GLUCOSE, CAPILLARY      Result Value Range   Glucose-Capillary 140 (*) 70 - 99 mg/dL  GLUCOSE, CAPILLARY      Result Value Range   Glucose-Capillary 190 (*) 70 - 99 mg/dL   Comment 1 Notify RN     Comment 2 Documented in Chart    GLUCOSE, CAPILLARY      Result Value Range   Glucose-Capillary 160 (*) 70 - 99 mg/dL   Comment 1 Notify RN     Comment 2 Documented in Chart    GLUCOSE, CAPILLARY      Result Value Range   Glucose-Capillary 143 (*) 70 - 99 mg/dL  GLUCOSE, CAPILLARY      Result Value Range   Glucose-Capillary 129 (*) 70 - 99 mg/dL  GLUCOSE, CAPILLARY      Result Value Range   Glucose-Capillary 156 (*) 70 - 99 mg/dL   Comment 1 Notify RN     Comment 2 Documented in Chart    GLUCOSE, CAPILLARY      Result Value Range   Glucose-Capillary 125 (*) 70 - 99 mg/dL   Comment 1 Notify RN     Comment 2 Documented in Chart    GLUCOSE, CAPILLARY      Result Value Range   Glucose-Capillary 115 (*) 70 - 99 mg/dL  GLUCOSE, CAPILLARY      Result Value Range   Glucose-Capillary 174 (*) 70 - 99 mg/dL  GLUCOSE, CAPILLARY      Result Value Range   Glucose-Capillary 189 (*) 70 - 99 mg/dL  GLUCOSE, CAPILLARY      Result Value Range   Glucose-Capillary 117 (*) 70 - 99 mg/dL   Comment 1 Notify RN     Comment 2 Documented in Chart    GLUCOSE, CAPILLARY      Result Value Range   Glucose-Capillary 119 (*) 70 - 99 mg/dL   Comment 1 Documented in Chart     Comment 2 Notify RN    GLUCOSE, CAPILLARY      Result Value Range   Glucose-Capillary 196 (*) 70 - 99 mg/dL  GLUCOSE, CAPILLARY      Result Value Range    Glucose-Capillary 117 (*) 70 - 99 mg/dL  GLUCOSE, CAPILLARY      Result Value Range   Glucose-Capillary 132 (*) 70 - 99 mg/dL   Comment 1 Documented in Chart     Comment 2 Notify RN    GLUCOSE, CAPILLARY      Result Value Range   Glucose-Capillary 121 (*) 70 - 99 mg/dL   Comment 1 Documented in Chart     Comment 2 Notify RN    GLUCOSE, CAPILLARY      Result Value Range   Glucose-Capillary 168 (*) 70 - 99 mg/dL  GLUCOSE, CAPILLARY      Result Value Range   Glucose-Capillary 107 (*) 70 - 99 mg/dL  GLUCOSE, CAPILLARY      Result Value  Range   Glucose-Capillary 171 (*) 70 - 99 mg/dL   Comment 1 Documented in Chart     Comment 2 Notify RN    GLUCOSE, CAPILLARY      Result Value Range   Glucose-Capillary 128 (*) 70 - 99 mg/dL   Comment 1 Documented in Chart     Comment 2 Notify RN    GLUCOSE, CAPILLARY      Result Value Range   Glucose-Capillary 147 (*) 70 - 99 mg/dL   Comment 1 Notify RN     Comment 2 Documented in Chart    GLUCOSE, CAPILLARY      Result Value Range   Glucose-Capillary 235 (*) 70 - 99 mg/dL   Comment 1 Notify RN     Comment 2 Documented in Chart    GLUCOSE, CAPILLARY      Result Value Range   Glucose-Capillary 105 (*) 70 - 99 mg/dL   Comment 1 Documented in Chart     Comment 2 Notify RN    GLUCOSE, CAPILLARY      Result Value Range   Glucose-Capillary 213 (*) 70 - 99 mg/dL   Comment 1 Notify RN     Comment 2 Documented in Chart    GLUCOSE, CAPILLARY      Result Value Range   Glucose-Capillary 141 (*) 70 - 99 mg/dL   Comment 1 Documented in Chart     Comment 2 Notify RN    GLUCOSE, CAPILLARY      Result Value Range   Glucose-Capillary 112 (*) 70 - 99 mg/dL   Comment 1 Documented in Chart     Comment 2 Notify RN    GLUCOSE, CAPILLARY      Result Value Range   Glucose-Capillary 130 (*) 70 - 99 mg/dL   Comment 1 Documented in Chart     Comment 2 Notify RN    GLUCOSE, CAPILLARY      Result Value Range   Glucose-Capillary 124 (*) 70 - 99 mg/dL   GLUCOSE, CAPILLARY      Result Value Range   Glucose-Capillary 122 (*) 70 - 99 mg/dL   Comment 1 Documented in Chart     Comment 2 Notify RN      Ct Head Wo Contrast  08/27/2012   *RADIOLOGY REPORT*  Clinical Data: 72 year old male status post craniotomy for intracranial hemorrhage.  Right parietal headache.  CT HEAD WITHOUT CONTRAST  Technique:  Contiguous axial images were obtained from the base of the skull through the vertex without contrast.  Comparison: 08/25/2012 and earlier.  Findings: Visualized paranasal sinuses and mastoids are clear. Stable visualized osseous structures.  Postoperative changes to the right scalp with skin staples and broad-based scalp hematoma. Visualized orbit soft tissues are within normal limits.  Calcified atherosclerosis at the skull base.  Right subdural drain remains in place.  Small volume pneumocephalus similar to the prior study.  Residual mixed density right subdural hematoma appears mildly decreased, now measuring up to 14 mm in thickness (previously 16 mm).  Leftward midline shift now is 10-11 mm (previously 12 mm).  Continued mass effect on the right lateral ventricle.  No ventriculomegaly.  Basilar cisterns remain patent. Stable gray-white matter differentiation throughout the brain.  No evidence of cortically based acute infarction identified.  No new intracranial hemorrhage.  IMPRESSION: 1.  Sequelae of right craniotomy and subdural drain remains in place.  Mildly decreased mixed density right subdural hematoma, now up to 14 mm in thickness. 2.  Mildly decreased leftward  midline shift, now 10 - 11 mm. 3.  No new intracranial abnormality.   Original Report Authenticated By: Erskine Speed, M.D.   Ct Head Wo Contrast  08/25/2012   *RADIOLOGY REPORT*  Clinical Data: Followup subdural hematoma.  CT HEAD WITHOUT CONTRAST  Technique:  Contiguous axial images were obtained from the base of the skull through the vertex without contrast.  Comparison: 08/24/2012.   Findings: Post right frontal craniectomy for drainage of right- sided subdural hematoma with catheter in place (reoperation since prior CT).  Pneumocephalus at the operative site.  Right-sided subdural hematoma now measures 1.7 cm maximal dimension versus prior 2.5 cm.  Marked mass effect upon the right lateral ventricle with midline shift to the left by 11.5 mm versus prior 14.8 mm. Trapping of the left lateral ventricle.  Right uncus displaced slightly into the adjacent cistern. Deformity of the mid brain.  Left parietal - occipital hypodensity without change.  Infarction not excluded.  Vascular calcifications.  IMPRESSION: Reoperation with placement of subdural drain with decrease in size although incomplete resolution of right-sided subdural hematoma now measuring 1.7 cm versus 2.5 cm on prior examination.  Midline shift to the left now 11.5 mm versus prior 14.8 mm.  Trapping of the left lateral ventricle.  Please see above.  This has been made a PRA call report utilizing dashboard call feature.   Original Report Authenticated By: Lacy Duverney, M.D.   Ct Head Wo Contrast  08/24/2012   **ADDENDUM** CREATED: 08/24/2012 03:16:14  These results were called by telephone on 08/24/2012 at 03:16 a.m. to Dr. Barnett Abu, who verbally acknowledged these results.  **END ADDENDUM** SIGNED BY: Tonia Ghent, M.D.  08/24/2012   *RADIOLOGY REPORT*  Clinical Data: Evacuation of subdural hematoma  CT HEAD WITHOUT CONTRAST  Technique:  Contiguous axial images were obtained from the base of the skull through the vertex without contrast.  Comparison: 08/20/2012  Findings: Evidence of right parietal craniotomy again noted.  Drain has been removed.  Mixed attenuation right subdural collection has increased in size, now 2.5 cm maximally on image 18 compared to 1.6 cm at the approximate similar anatomic level of the previous exam. Increased leftward midline shift is present, now 1.5 cm image 18 compared to 1.1 cm at a similar  anatomic level.  There has been interval increase in acute to subacute hemorrhage tracking along the tentorium cerebelli on the right and along the right parietal convexity within the subdural collection.  IMPRESSION: Increased right subdural mixed density collection with subacute/acute hemorrhagic component, and increased leftward midline shift. These results were called by telephone on 08/24/2012 at 1:45 a.m. to Jonny Ruiz, RN, who verbally acknowledged these results. I also paged Dr. Danielle Dess at 1:45am and 2:05 am and discussed these findings with Dr. Darrick Penna by phone at 2:15am.  Original Report Authenticated By: Christiana Pellant, M.D.   Ct Head Wo Contrast  08/20/2012   *RADIOLOGY REPORT*  Clinical Data: Follow-up subdural hemorrhage  CT HEAD WITHOUT CONTRAST  Technique:  Contiguous axial images were obtained from the base of the skull through the vertex without contrast.  Comparison: CT 08/11/2012  Findings: Interval right-sided craniotomy for subdural drainage. Subdural drain in satisfactory position.  Mixed density subdural hematoma measures 16 mm which is improved from the prior study. There is less high density blood in the subdural space compared with the prior study.  There is mild to moderate subdural gas present on the right.  There is mass effect on the right cerebral hemisphere with 11 mm  midline shift which is unchanged.  No hydrocephalus.  No acute infarct or mass.  No new hemorrhage is seen.  IMPRESSION: Subdural drainage on the right.  There is mild improvement in subdural hematoma.  There is no change in midline shift which measures 11 mm to the left.   Original Report Authenticated By: Janeece Riggers, M.D.    Antibiotics:  Anti-infectives   Start     Dose/Rate Route Frequency Ordered Stop   08/24/12 0553  bacitracin 50,000 Units in sodium chloride irrigation 0.9 % 500 mL irrigation  Status:  Discontinued       As needed 08/24/12 0554 08/24/12 0634   08/24/12 0529  bacitracin [address] UNITS  injection    Comments:  AYDELETTE, JAMIE: cabinet override      08/24/12 0529 08/24/12 1729   08/24/12 0509  ceFAZolin (ANCEF) 2-3 GM-% IVPB SOLR    Comments:  JOHNSON, LORI: cabinet override      08/24/12 0509 08/24/12 1714   08/20/12 1800  ceFAZolin (ANCEF) IVPB 2 g/50 mL premix  Status:  Discontinued     2 g 100 mL/hr over 30 Minutes Intravenous Every 8 hours 08/20/12 1607 08/21/12 0957   08/20/12 0330  ceFAZolin (ANCEF) IVPB 2 g/50 mL premix     2 g 100 mL/hr over 30 Minutes Intravenous Every 8 hours 08/19/12 2109 08/20/12 1132   08/19/12 2004  bacitracin 50,000 Units in sodium chloride irrigation 0.9 % 500 mL irrigation  Status:  Discontinued       As needed 08/19/12 2021 08/19/12 2106      Discharge Exam: Blood pressure 134/56, pulse 66, temperature 98.4 F (36.9 C), temperature source Oral, resp. rate 18, height 6' (1.829 m), weight 117.4 kg (258 lb 13.1 oz), SpO2 98.00%. Neurologic: Mental status: alertness: alert, affect: flat Motor: Normal Gait: Antalgic Incision okay  Discharge Medications:     Medication List    TAKE these medications       amLODipine-benazepril 10-20 MG per capsule  Commonly known as:  LOTREL  Take 1 capsule by mouth 2 (two) times daily.     aspirin 81 MG chewable tablet  Chew 81 mg by mouth daily.     glimepiride 2 MG tablet  Commonly known as:  AMARYL  Take 2 mg by mouth daily before breakfast.     HYDROcodone-acetaminophen 5-325 MG per tablet  Commonly known as:  NORCO/VICODIN  Take 1 tablet by mouth every 4 (four) hours as needed.     levETIRAcetam 500 MG tablet  Commonly known as:  KEPPRA  Take 1 tablet (500 mg total) by mouth 2 (two) times daily.     pioglitazone 45 MG tablet  Commonly known as:  ACTOS  Take 45 mg by mouth daily.     rosuvastatin 10 MG tablet  Commonly known as:  CRESTOR  Take 10 mg by mouth daily.     valsartan-hydrochlorothiazide 320-25 MG per tablet  Commonly known as:  DIOVAN-HCT  Take 1 tablet by  mouth daily.        Disposition: To SNF   Final Dx: Craniotomy for right subdural hematoma      Discharge Orders   Future Orders Complete By Expires     Call MD for:  difficulty breathing, headache or visual disturbances  As directed     Call MD for:  persistant nausea and vomiting  As directed     Call MD for:  redness, tenderness, or signs of infection (pain, swelling, redness, odor or green/yellow  discharge around incision site)  As directed     Call MD for:  severe uncontrolled pain  As directed     Call MD for:  temperature >100.4  As directed     Diet - low sodium heart healthy  As directed     Discharge instructions  As directed     Comments:      Activity per the nursing home, may DC staples from scalp    Increase activity slowly  As directed        Follow-up Information   Follow up with CRAM,GARY P, MD. Schedule an appointment as soon as possible for a visit in 2 weeks.   Contact information:   [address] [place] [address] [phone]        Signed: Tia Alert 09/03/2012, 12:41 PM

## 2012-09-03 NOTE — Progress Notes (Signed)
SNF auth still has not been rec'd- updated daughter- patient walked 500 feet and may not be approved- proposed plans for d/c home with [facility]- daughter is understanding of this and says they can manage him at home- she is a CNA and other family members are also healthcare providers-  RNCM facilitating HH needs in case and likelihood of no SNF approval-  Reece Levy, MSW, Theresia Majors [phone]

## 2012-10-05 ENCOUNTER — Other Ambulatory Visit: Payer: Self-pay | Admitting: Neurosurgery

## 2012-10-05 DIAGNOSIS — S065X9A Traumatic subdural hemorrhage with loss of consciousness of unspecified duration, initial encounter: Secondary | ICD-10-CM

## 2012-10-08 ENCOUNTER — Ambulatory Visit
Admission: RE | Admit: 2012-10-08 | Discharge: 2012-10-08 | Disposition: A | Discharge status: Home / Self Care | Payer: Medicare Other | Source: Ambulatory Visit | Attending: Neurosurgery | Admitting: Neurosurgery

## 2012-10-08 DIAGNOSIS — S065X9A Traumatic subdural hemorrhage with loss of consciousness of unspecified duration, initial encounter: Secondary | ICD-10-CM

## 2012-12-08 ENCOUNTER — Other Ambulatory Visit: Payer: Self-pay | Admitting: Neurosurgery

## 2012-12-08 DIAGNOSIS — I6200 Nontraumatic subdural hemorrhage, unspecified: Secondary | ICD-10-CM

## 2012-12-14 ENCOUNTER — Ambulatory Visit
Admission: RE | Admit: 2012-12-14 | Discharge: 2012-12-14 | Disposition: A | Discharge status: Home / Self Care | Payer: BC Managed Care – PPO | Source: Ambulatory Visit | Attending: Neurosurgery | Admitting: Neurosurgery

## 2012-12-14 DIAGNOSIS — I6200 Nontraumatic subdural hemorrhage, unspecified: Secondary | ICD-10-CM

## 2013-02-15 ENCOUNTER — Emergency Department (HOSPITAL_COMMUNITY): Payer: BC Managed Care – PPO

## 2013-02-15 ENCOUNTER — Encounter (HOSPITAL_COMMUNITY): Payer: Self-pay | Admitting: Emergency Medicine

## 2013-02-15 ENCOUNTER — Inpatient Hospital Stay (HOSPITAL_COMMUNITY)
Admission: EM | Admit: 2013-02-15 | Discharge: 2013-03-05 | DRG: 003 | Disposition: A | Discharge status: Skilled Nursing Facility | Payer: BC Managed Care – PPO | Attending: Pulmonary Disease | Admitting: Pulmonary Disease

## 2013-02-15 DIAGNOSIS — I4510 Unspecified right bundle-branch block: Secondary | ICD-10-CM | POA: Diagnosis present

## 2013-02-15 DIAGNOSIS — Z930 Tracheostomy status: Secondary | ICD-10-CM

## 2013-02-15 DIAGNOSIS — R627 Adult failure to thrive: Secondary | ICD-10-CM | POA: Diagnosis present

## 2013-02-15 DIAGNOSIS — G936 Cerebral edema: Secondary | ICD-10-CM | POA: Diagnosis present

## 2013-02-15 DIAGNOSIS — E872 Acidosis, unspecified: Secondary | ICD-10-CM | POA: Diagnosis not present

## 2013-02-15 DIAGNOSIS — IMO0002 Reserved for concepts with insufficient information to code with codable children: Secondary | ICD-10-CM | POA: Diagnosis not present

## 2013-02-15 DIAGNOSIS — E875 Hyperkalemia: Secondary | ICD-10-CM

## 2013-02-15 DIAGNOSIS — Z7982 Long term (current) use of aspirin: Secondary | ICD-10-CM

## 2013-02-15 DIAGNOSIS — S065X9A Traumatic subdural hemorrhage with loss of consciousness of unspecified duration, initial encounter: Secondary | ICD-10-CM

## 2013-02-15 DIAGNOSIS — R4020 Unspecified coma: Secondary | ICD-10-CM | POA: Diagnosis present

## 2013-02-15 DIAGNOSIS — E870 Hyperosmolality and hypernatremia: Secondary | ICD-10-CM | POA: Diagnosis present

## 2013-02-15 DIAGNOSIS — I6200 Nontraumatic subdural hemorrhage, unspecified: Principal | ICD-10-CM | POA: Diagnosis present

## 2013-02-15 DIAGNOSIS — J95821 Acute postprocedural respiratory failure: Secondary | ICD-10-CM

## 2013-02-15 DIAGNOSIS — D649 Anemia, unspecified: Secondary | ICD-10-CM | POA: Diagnosis present

## 2013-02-15 DIAGNOSIS — Z6835 Body mass index (BMI) 35.0-35.9, adult: Secondary | ICD-10-CM

## 2013-02-15 DIAGNOSIS — E785 Hyperlipidemia, unspecified: Secondary | ICD-10-CM | POA: Diagnosis present

## 2013-02-15 DIAGNOSIS — E669 Obesity, unspecified: Secondary | ICD-10-CM | POA: Diagnosis present

## 2013-02-15 DIAGNOSIS — Z9911 Dependence on respirator [ventilator] status: Secondary | ICD-10-CM

## 2013-02-15 DIAGNOSIS — E119 Type 2 diabetes mellitus without complications: Secondary | ICD-10-CM | POA: Diagnosis present

## 2013-02-15 DIAGNOSIS — J9600 Acute respiratory failure, unspecified whether with hypoxia or hypercapnia: Secondary | ICD-10-CM | POA: Diagnosis present

## 2013-02-15 DIAGNOSIS — Z79899 Other long term (current) drug therapy: Secondary | ICD-10-CM

## 2013-02-15 DIAGNOSIS — N183 Chronic kidney disease, stage 3 unspecified: Secondary | ICD-10-CM | POA: Diagnosis present

## 2013-02-15 DIAGNOSIS — S065XAA Traumatic subdural hemorrhage with loss of consciousness status unknown, initial encounter: Secondary | ICD-10-CM | POA: Diagnosis present

## 2013-02-15 DIAGNOSIS — I129 Hypertensive chronic kidney disease with stage 1 through stage 4 chronic kidney disease, or unspecified chronic kidney disease: Secondary | ICD-10-CM | POA: Diagnosis present

## 2013-02-15 DIAGNOSIS — Z833 Family history of diabetes mellitus: Secondary | ICD-10-CM

## 2013-02-15 DIAGNOSIS — I10 Essential (primary) hypertension: Secondary | ICD-10-CM | POA: Diagnosis present

## 2013-02-15 DIAGNOSIS — Z66 Do not resuscitate: Secondary | ICD-10-CM | POA: Diagnosis not present

## 2013-02-15 HISTORY — DX: Unspecified right bundle-branch block: I45.10

## 2013-02-15 HISTORY — DX: Typhoid fever, unspecified: A01.00

## 2013-02-15 LAB — COMPREHENSIVE METABOLIC PANEL
ALT: 24 U/L (ref 0–53)
AST: 32 U/L (ref 0–37)
Albumin: 4.1 g/dL (ref 3.5–5.2)
Alkaline Phosphatase: 144 U/L — ABNORMAL HIGH (ref 39–117)
GFR calc Af Amer: 48 mL/min — ABNORMAL LOW (ref >90)
Glucose, Bld: 176 mg/dL — ABNORMAL HIGH (ref 70–99)
Potassium: 4.9 mEq/L (ref 3.5–5.1)
Sodium: 133 mEq/L — ABNORMAL LOW (ref 135–145)
Total Protein: 7.7 g/dL (ref 6.0–8.3)

## 2013-02-15 LAB — POCT I-STAT 3, ART BLOOD GAS (G3+)
Bicarbonate: 21.7 mEq/L (ref 20.0–24.0)
TCO2: 23 mmol/L (ref 0–100)
pCO2 arterial: 45.2 mmHg — ABNORMAL HIGH (ref 35.0–45.0)
pH, Arterial: 7.29 — ABNORMAL LOW (ref 7.350–7.450)

## 2013-02-15 LAB — URINALYSIS, ROUTINE W REFLEX MICROSCOPIC
Bilirubin Urine: NEGATIVE
Ketones, ur: NEGATIVE mg/dL
Leukocytes, UA: NEGATIVE
Nitrite: NEGATIVE
Protein, ur: 30 mg/dL — AB
Urobilinogen, UA: 0.2 mg/dL (ref 0.0–1.0)

## 2013-02-15 LAB — CBC WITH DIFFERENTIAL/PLATELET
Basophils Absolute: 0 10*3/uL (ref 0.0–0.1)
Eosinophils Absolute: 0.2 10*3/uL (ref 0.0–0.7)
Lymphocytes Relative: 12 % (ref 12–46)
Lymphs Abs: 2 10*3/uL (ref 0.7–4.0)
MCH: 30.7 pg (ref 26.0–34.0)
Neutrophils Relative %: 81 % — ABNORMAL HIGH (ref 43–77)
Platelets: 194 10*3/uL (ref 150–400)
RBC: 4.01 MIL/uL — ABNORMAL LOW (ref 4.22–5.81)
RDW: 13.4 % (ref 11.5–15.5)
WBC: 17.1 10*3/uL — ABNORMAL HIGH (ref 4.0–10.5)

## 2013-02-15 LAB — SALICYLATE LEVEL: Salicylate Lvl: <2 mg/dL — ABNORMAL LOW (ref 2.8–20.0)

## 2013-02-15 LAB — ETHANOL: Alcohol, Ethyl (B): <11 mg/dL (ref 0–11)

## 2013-02-15 LAB — RAPID URINE DRUG SCREEN, HOSP PERFORMED: Barbiturates: NOT DETECTED

## 2013-02-15 LAB — GLUCOSE, CAPILLARY: Glucose-Capillary: 165 mg/dL — ABNORMAL HIGH (ref 70–99)

## 2013-02-15 LAB — ACETAMINOPHEN LEVEL: Acetaminophen (Tylenol), Serum: <15 ug/mL (ref 10–30)

## 2013-02-15 LAB — PROTIME-INR: INR: 1.01 (ref 0.00–1.49)

## 2013-02-15 LAB — URINE MICROSCOPIC-ADD ON

## 2013-02-15 LAB — APTT: aPTT: 30 seconds (ref 24–37)

## 2013-02-15 MED ORDER — NICARDIPINE HCL IN NACL 20-0.86 MG/200ML-% IV SOLN
5.0000 mg/h | Freq: Once | INTRAVENOUS | Status: AC
  Administered 2013-02-15: 5 mg/h via INTRAVENOUS
  Filled 2013-02-15: qty 200

## 2013-02-15 MED ORDER — ETOMIDATE 2 MG/ML IV SOLN
INTRAVENOUS | Status: AC | PRN
  Administered 2013-02-15: 30 mg via INTRAVENOUS

## 2013-02-15 MED ORDER — SODIUM CHLORIDE 0.9 % IV SOLN: 250.0000 mL | INTRAVENOUS | Status: DC | PRN

## 2013-02-15 MED ORDER — INSULIN ASPART 100 UNIT/ML ~~LOC~~ SOLN
0.0000 [IU] | Freq: Four times a day (QID) | SUBCUTANEOUS | Status: DC
  Administered 2013-02-15: 3 [IU] via SUBCUTANEOUS
  Administered 2013-02-16 (×2): 2 [IU] via SUBCUTANEOUS

## 2013-02-15 MED ORDER — NICARDIPINE HCL IN NACL 20-0.86 MG/200ML-% IV SOLN
5.0000 mg/h | INTRAVENOUS | Status: DC
  Administered 2013-02-15 – 2013-02-16 (×3): 10 mg/h via INTRAVENOUS
  Filled 2013-02-15 (×3): qty 200

## 2013-02-15 MED ORDER — FENTANYL CITRATE 0.05 MG/ML IJ SOLN
25.0000 ug | INTRAMUSCULAR | Status: DC | PRN
  Administered 2013-02-15 – 2013-02-17 (×5): 100 ug via INTRAVENOUS
  Filled 2013-02-15 (×7): qty 2

## 2013-02-15 MED ORDER — ROCURONIUM BROMIDE 50 MG/5ML IV SOLN
INTRAVENOUS | Status: AC | PRN
  Administered 2013-02-15: 100 mg via INTRAVENOUS

## 2013-02-15 MED ORDER — METOPROLOL TARTRATE 1 MG/ML IV SOLN
2.5000 mg | INTRAVENOUS | Status: DC | PRN
  Administered 2013-02-16: 2.5 mg via INTRAVENOUS
  Administered 2013-02-17: 5 mg via INTRAVENOUS
  Filled 2013-02-15 (×2): qty 5

## 2013-02-15 MED ORDER — INSULIN ASPART 100 UNIT/ML ~~LOC~~ SOLN: 0.0000 [IU] | Freq: Four times a day (QID) | SUBCUTANEOUS | Status: DC

## 2013-02-15 MED ORDER — MIDAZOLAM HCL 2 MG/2ML IJ SOLN
2.0000 mg | INTRAMUSCULAR | Status: DC | PRN
  Administered 2013-02-15: 2 mg via INTRAVENOUS
  Filled 2013-02-15: qty 2

## 2013-02-15 MED ORDER — HYDRALAZINE HCL 20 MG/ML IJ SOLN
10.0000 mg | INTRAMUSCULAR | Status: DC | PRN
  Administered 2013-02-20: 20 mg via INTRAVENOUS
  Administered 2013-02-21: 10 mg via INTRAVENOUS
  Filled 2013-02-15 (×2): qty 1

## 2013-02-15 MED ORDER — MIDAZOLAM HCL 2 MG/2ML IJ SOLN
2.0000 mg | INTRAMUSCULAR | Status: DC | PRN
Start: 2013-02-15 — End: 2013-02-15
  Administered 2013-02-15: 2 mg via INTRAVENOUS
  Filled 2013-02-15: qty 2

## 2013-02-15 MED ORDER — PANTOPRAZOLE SODIUM 40 MG IV SOLR
40.0000 mg | Freq: Every day | INTRAVENOUS | Status: DC
  Administered 2013-02-15 – 2013-02-18 (×4): 40 mg via INTRAVENOUS
  Filled 2013-02-15 (×5): qty 40

## 2013-02-15 MED ORDER — FENTANYL CITRATE 0.05 MG/ML IJ SOLN
50.0000 ug | INTRAMUSCULAR | Status: DC | PRN
  Administered 2013-02-15: 50 ug via INTRAVENOUS
  Filled 2013-02-15: qty 2

## 2013-02-15 NOTE — ED Notes (Signed)
X-ray at bedside

## 2013-02-15 NOTE — ED Notes (Signed)
Pt called neighbor and stated he felt weak.  When neighbor went to see him he was unresponsive.  Last seen normal at 3 pm.  Responsive to painful stimuli when ems arrived.  CBG at 123.  Given 2 of narcan with no response.  Pt began posturing with L arm and began agonal breathing.

## 2013-02-15 NOTE — ED Notes (Signed)
X-ray called x 2 

## 2013-02-15 NOTE — ED Notes (Signed)
Assisted nurse with difficult blood draw.

## 2013-02-15 NOTE — ED Provider Notes (Addendum)
72 year old male was brought in by EMS unresponsive with assisted ventilations. He called a friend at about 3 PM stating that he wasn't feeling well. EMS was concerned for possible stroke and brought him here. EMS reports that his mental state declined en route to where he was unresponsive and needed assistance with ventilation. They also reported dilated pupils. Patient is unresponsive and unable to give any history. He is not severely hypertensive. Pupils are 5 mm and fixed. Fundi show increased optic disc cupping but no overt papilledema. Lungs are clear and heart has regular rate and rhythm. He does have trace pitting edema. Neurologic exam is significant for unresponsiveness. He has bilateral Babinski reflexes and has decerebrate posturing. Old records are reviewed and that he had been admitted in May for evacuation of acute subdural hematoma. Shortly after arrival in the ED, he had was intubated by Dr.Thuet, emergency medicine resident. Procedure was done under my direct supervision I was present during the entire procedure. He is being sent for CT of the head. At this point, there is strong suspicion he has a brainstem bleed..  CT actually shows a subdural hematoma. This is curious given his presentation with subdural hematoma 5 months ago. Her surgery has been consulted but they feel he is a poor surgical candidate. He will be admitted under the critical care service.  I saw and evaluated the patient, reviewed the resident's note and I agree with the findings and plan.  EKG Interpretation    Date/Time:  Monday February 15 2013 17:13:48 EST Ventricular Rate:  78 PR Interval:  248 QRS Duration: 154 QT Interval:  388 QTC Calculation: 442 R Axis:   82 Text Interpretation:  Sinus rhythm Prolonged PR interval Right bundle branch block Probable inferior infarct, old Probable anterolateral infarct, old When compared with ECG of 08/22/2012, No significant change was found Confirmed by [facility]  MD, Iren Whipp  ([id number]) on 02/15/2013 5:32:14 PM           Results for orders placed during the hospital encounter of 02/15/13  CBC WITH DIFFERENTIAL      Result Value Range   WBC 17.1 (*) 4.0 - 10.5 K/uL   RBC 4.01 (*) 4.22 - 5.81 MIL/uL   Hemoglobin 12.3 (*) 13.0 - 17.0 g/dL   HCT 40.9 (*) 81.1 - 91.4 %   MCV 90.5  78.0 - 100.0 fL   MCH 30.7  26.0 - 34.0 pg   MCHC 33.9  30.0 - 36.0 g/dL   RDW 78.2  95.6 - 21.3 %   Platelets 194  150 - 400 K/uL   Neutrophils Relative % 81 (*) 43 - 77 %   Neutro Abs 13.8 (*) 1.7 - 7.7 K/uL   Lymphocytes Relative 12  12 - 46 %   Lymphs Abs 2.0  0.7 - 4.0 K/uL   Monocytes Relative 7  3 - 12 %   Monocytes Absolute 1.2 (*) 0.1 - 1.0 K/uL   Eosinophils Relative 1  0 - 5 %   Eosinophils Absolute 0.2  0.0 - 0.7 K/uL   Basophils Relative 0  0 - 1 %   Basophils Absolute 0.0  0.0 - 0.1 K/uL  COMPREHENSIVE METABOLIC PANEL      Result Value Range   Sodium 133 (*) 135 - 145 mEq/L   Potassium 4.9  3.5 - 5.1 mEq/L   Chloride 101  96 - 112 mEq/L   CO2 18 (*) 19 - 32 mEq/L   Glucose, Bld 176 (*) 70 - 99  mg/dL   BUN 37 (*) 6 - 23 mg/dL   Creatinine, Ser 0.98 (*) 0.50 - 1.35 mg/dL   Calcium 8.6  8.4 - 11.9 mg/dL   Total Protein 7.7  6.0 - 8.3 g/dL   Albumin 4.1  3.5 - 5.2 g/dL   AST 32  0 - 37 U/L   ALT 24  0 - 53 U/L   Alkaline Phosphatase 144 (*) 39 - 117 U/L   Total Bilirubin 0.4  0.3 - 1.2 mg/dL   GFR calc non Af Amer 41 (*) >90 mL/min   GFR calc Af Amer 48 (*) >90 mL/min  LACTIC ACID, PLASMA      Result Value Range   Lactic Acid, Venous 1.2  0.5 - 2.2 mmol/L  PROTIME-INR      Result Value Range   Prothrombin Time 13.1  11.6 - 15.2 seconds   INR 1.01  0.00 - 1.49  APTT      Result Value Range   aPTT 30  24 - 37 seconds  ETHANOL      Result Value Range   Alcohol, Ethyl (B) <11  0 - 11 mg/dL  SALICYLATE LEVEL      Result Value Range   Salicylate Lvl <2.0 (*) 2.8 - 20.0 mg/dL  ACETAMINOPHEN LEVEL      Result Value Range   Acetaminophen (Tylenol),  Serum <15.0  10 - 30 ug/mL  URINALYSIS, ROUTINE W REFLEX MICROSCOPIC      Result Value Range   Color, Urine YELLOW  YELLOW   APPearance CLEAR  CLEAR   Specific Gravity, Urine 1.011  1.005 - 1.030   pH 6.0  5.0 - 8.0   Glucose, UA NEGATIVE  NEGATIVE mg/dL   Hgb urine dipstick TRACE (*) NEGATIVE   Bilirubin Urine NEGATIVE  NEGATIVE   Ketones, ur NEGATIVE  NEGATIVE mg/dL   Protein, ur 30 (*) NEGATIVE mg/dL   Urobilinogen, UA 0.2  0.0 - 1.0 mg/dL   Nitrite NEGATIVE  NEGATIVE   Leukocytes, UA NEGATIVE  NEGATIVE  URINE RAPID DRUG SCREEN (HOSP PERFORMED)      Result Value Range   Opiates NONE DETECTED  NONE DETECTED   Cocaine NONE DETECTED  NONE DETECTED   Benzodiazepines NONE DETECTED  NONE DETECTED   Amphetamines NONE DETECTED  NONE DETECTED   Tetrahydrocannabinol NONE DETECTED  NONE DETECTED   Barbiturates NONE DETECTED  NONE DETECTED  URINE MICROSCOPIC-ADD ON      Result Value Range   RBC / HPF 3-6  <3 RBC/hpf  POCT I-STAT 3, BLOOD GAS (G3+)      Result Value Range   pH, Arterial 7.290 (*) 7.350 - 7.450   pCO2 arterial 45.2 (*) 35.0 - 45.0 mmHg   pO2, Arterial 165.0 (*) 80.0 - 100.0 mmHg   Bicarbonate 21.7  20.0 - 24.0 mEq/L   TCO2 23  0 - 100 mmol/L   O2 Saturation 99.0     Acid-base deficit 5.0 (*) 0.0 - 2.0 mmol/L   Collection site RADIAL, ALLEN'S TEST ACCEPTABLE     Drawn by Operator     Sample type ARTERIAL     Ct Head Wo Contrast  02/15/2013   CLINICAL DATA:  72 year old male with altered mental status. History of right subdural hematoma and craniectomy.  EXAM: CT HEAD WITHOUT CONTRAST  TECHNIQUE: Contiguous axial images were obtained from the base of the skull through the vertex without intravenous contrast.  COMPARISON:  12/14/2012  FINDINGS: A new large acute left subdural hematoma  is identified with maximal diameter of 2.3 cm. This causes 1.5 cm left to right midline shift, right lateral ventricular enlargement and effacement of the basilar cisterns.  A right  craniectomy defect is identified.  No acute bony abnormalities are present  IMPRESSION: Large acute left subdural hematoma with maximal diameter of 2.3 cm, causing 1.5 cm left-to-right midline shift, right lateral ventricular enlargement and effacement of the basilar cisterns.  Critical Value/emergent results were called by telephone at the time of interpretation on 02/15/2013 at 6:21 PM to Dr.WILEY THUET , who verbally acknowledged these results.   Electronically Signed   By: Laveda Abbe M.D.   On: 02/15/2013 18:21   Dg Chest Portable 1 View  02/15/2013   CLINICAL DATA:  Intubated, orogastric tube  EXAM: PORTABLE CHEST - 1 VIEW  COMPARISON:  None  FINDINGS: The patient is intubated. The tip of the ET tube is 3.5 cm above the carinal. A gastric tube is present. The tip is not identified but lies below the field of view, presumably in the gastric antrum or proximal small bowel. Given portable frontal technique, cardiac contours are within normal limits. The mediastinum is widened and somewhat ill-defined. No definite pleural effusion or pneumothorax. Very low inspiratory volumes with bibasilar and probable perihilar atelectasis. No acute osseous abnormality.  IMPRESSION: 1. Ill-defined and potentially widened mediastinum may simply reflect perihilar atelectasis in the setting of low inspiratory volumes, however acute aortic dissection or other aortic abnormality cannot be excluded by plain film. Chest CTA could further evaluate if clinically warranted. Alternately, if clinical suspicion is low, repeat chest x-ray following improved ventilation may be helpful. 2. The tip of the ET tube is in good position 3.5 cm above the carina. These results were called by telephone at the time of interpretation on 02/15/2013 at 6:15 PM to Dr. Dione Booze , who verbally acknowledged these results.   Electronically Signed   By: Malachy Moan M.D.   On: 02/15/2013 18:22   Images viewed by me, discussed with  radiologist.  CRITICAL CARE Performed by: EXBMW,UXLKG Total critical care time: 60 minutes Critical care time was exclusive of separately billable procedures and treating other patients. Critical care was necessary to treat or prevent imminent or life-threatening deterioration. Critical care was time spent personally by me on the following activities: development of treatment plan with patient and/or surrogate as well as nursing, discussions with consultants, evaluation of patient's response to treatment, examination of patient, obtaining history from patient or surrogate, ordering and performing treatments and interventions, ordering and review of laboratory studies, ordering and review of radiographic studies, pulse oximetry and re-evaluation of patient's condition.    Dione Booze, MD 02/15/13 [id number]  Dione Booze, MD 02/16/13 [phone]

## 2013-02-15 NOTE — ED Notes (Signed)
Dr Lucila Maine speaking with family at bedside.

## 2013-02-15 NOTE — ED Notes (Signed)
Neurosurgery in consultation room with family to discuss plan of care.

## 2013-02-15 NOTE — Consult Note (Addendum)
Reason for Consult: Acute subdural hematoma Referring Physician: Emergency department  Austin Salas is an 72 y.o. male.  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 Patient apparently had been at work today doing well when he came home he said he is feeling poorly and a collapsed EMS was called patient was taken the [place] [place] and subsequently transferred here. On arrival in both the ER as he was unresponsive was intubated and CT scan of his head showed a large a left-sided acute subdural and we have been consult.  Past Medical History  Diagnosis Date  . Diabetes mellitus without complication   . Hyperlipidemia   . RBBB     Past Surgical History  Procedure Laterality Date  . Craniotomy Right 08/19/2012    Procedure: Right CRANIOTOMY for HEMATOMA EVACUATION SUBDURAL;  Surgeon: Mariam Dollar, MD;  Location: [facility];  Service: Neurosurgery;  Laterality: Right;  . Craniotomy N/A 08/24/2012    Procedure: CRANIECTOMY HEMATOMA RE-EVACUATION SUBDURAL;  Surgeon: Barnett Abu, MD;  Location: [facility];  Service: Neurosurgery;  Laterality: N/A;    Family History  Problem Relation Age of Onset  . Diabetes Brother     Social History:  reports that he has never smoked. He has never used smokeless tobacco. He reports that he does not drink alcohol or use illicit drugs.  Allergies: No Known Allergies  Medications: I have reviewed the patient's current medications.  Results for orders placed during the hospital encounter of 02/15/13 (from the past 48 hour(s))  CBC WITH DIFFERENTIAL     Status: Abnormal   Collection Time    02/15/13  5:40 PM      Result  Value Range   WBC 17.1 (*) 4.0 - 10.5 K/uL   RBC 4.01 (*) 4.22 - 5.81 MIL/uL   Hemoglobin 12.3 (*) 13.0 - 17.0 g/dL   HCT 69.6 (*) 29.5 - 28.4 %   MCV 90.5  78.0 - 100.0 fL   MCH 30.7  26.0 - 34.0 pg   MCHC 33.9  30.0 - 36.0 g/dL   RDW 13.2  44.0 - 10.2 %   Platelets 194  150 - 400 K/uL   Neutrophils Relative % 81 (*) 43 - 77 %   Neutro Abs 13.8 (*) 1.7 - 7.7 K/uL   Lymphocytes Relative 12  12 - 46 %   Lymphs Abs 2.0  0.7 - 4.0 K/uL   Monocytes Relative 7  3 - 12 %   Monocytes Absolute 1.2 (*) 0.1 - 1.0 K/uL   Eosinophils Relative 1  0 - 5 %   Eosinophils Absolute 0.2  0.0 - 0.7 K/uL   Basophils Relative 0  0 - 1 %   Basophils Absolute 0.0  0.0 - 0.1 K/uL  COMPREHENSIVE METABOLIC PANEL     Status: Abnormal   Collection Time    02/15/13  5:40 PM      Result Value Range   Sodium 133 (*) 135 - 145 mEq/L   Potassium 4.9  3.5 - 5.1 mEq/L   Chloride 101  96 - 112 mEq/L   CO2 18 (*) 19 - 32 mEq/L   Glucose, Bld 176 (*) 70 -  99 mg/dL   BUN 37 (*) 6 - 23 mg/dL   Creatinine, Ser 0.86 (*) 0.50 - 1.35 mg/dL   Calcium 8.6  8.4 - 57.8 mg/dL   Total Protein 7.7  6.0 - 8.3 g/dL   Albumin 4.1  3.5 - 5.2 g/dL   AST 32  0 - 37 U/L   Comment: ICTERUS AT THIS LEVEL MAY AFFECT RESULT   ALT 24  0 - 53 U/L   Alkaline Phosphatase 144 (*) 39 - 117 U/L   Total Bilirubin 0.4  0.3 - 1.2 mg/dL   GFR calc non Af Amer 41 (*) >90 mL/min   GFR calc Af Amer 48 (*) >90 mL/min   Comment: (NOTE)     The eGFR has been calculated using the CKD EPI equation.     This calculation has not been validated in all clinical situations.     eGFR's persistently <90 mL/min signify possible Chronic Kidney     Disease.  LACTIC ACID, PLASMA     Status: None   Collection Time    02/15/13  5:40 PM      Result Value Range   Lactic Acid, Venous 1.2  0.5 - 2.2 mmol/L  PROTIME-INR     Status: None   Collection Time    02/15/13  5:40 PM      Result Value Range   Prothrombin Time 13.1  11.6 - 15.2 seconds   INR 1.01  0.00  - 1.49  APTT     Status: None   Collection Time    02/15/13  5:40 PM      Result Value Range   aPTT 30  24 - 37 seconds  ETHANOL     Status: None   Collection Time    02/15/13  5:40 PM      Result Value Range   Alcohol, Ethyl (B) <11  0 - 11 mg/dL   Comment:            LOWEST DETECTABLE LIMIT FOR     SERUM ALCOHOL IS 11 mg/dL     FOR MEDICAL PURPOSES ONLY  SALICYLATE LEVEL     Status: Abnormal   Collection Time    02/15/13  5:40 PM      Result Value Range   Salicylate Lvl <2.0 (*) 2.8 - 20.0 mg/dL  ACETAMINOPHEN LEVEL     Status: None   Collection Time    02/15/13  5:40 PM      Result Value Range   Acetaminophen (Tylenol), Serum <15.0  10 - 30 ug/mL   Comment:            THERAPEUTIC CONCENTRATIONS VARY     SIGNIFICANTLY. A RANGE OF 10-30     ug/mL MAY BE AN EFFECTIVE     CONCENTRATION FOR MANY PATIENTS.     HOWEVER, SOME ARE BEST TREATED     AT CONCENTRATIONS OUTSIDE THIS     RANGE.     ACETAMINOPHEN CONCENTRATIONS     >150 ug/mL AT 4 HOURS AFTER     INGESTION AND >50 ug/mL AT 12     HOURS AFTER INGESTION ARE     OFTEN ASSOCIATED WITH TOXIC     REACTIONS.  POCT I-STAT 3, BLOOD GAS (G3+)     Status: Abnormal   Collection Time    02/15/13  5:57 PM      Result Value Range   pH, Arterial 7.290 (*) 7.350 - 7.450   pCO2 arterial 45.2 (*) 35.0 -  45.0 mmHg   pO2, Arterial 165.0 (*) 80.0 - 100.0 mmHg   Bicarbonate 21.7  20.0 - 24.0 mEq/L   TCO2 23  0 - 100 mmol/L   O2 Saturation 99.0     Acid-base deficit 5.0 (*) 0.0 - 2.0 mmol/L   Collection site RADIAL, ALLEN'S TEST ACCEPTABLE     Drawn by Operator     Sample type ARTERIAL      Ct Head Wo Contrast  02/15/2013   CLINICAL DATA:  72 year old male with altered mental status. History of right subdural hematoma and craniectomy.  EXAM: CT HEAD WITHOUT CONTRAST  TECHNIQUE: Contiguous axial images were obtained from the base of the skull through the vertex without intravenous contrast.  COMPARISON:  12/14/2012  FINDINGS: A  new large acute left subdural hematoma is identified with maximal diameter of 2.3 cm. This causes 1.5 cm left to right midline shift, right lateral ventricular enlargement and effacement of the basilar cisterns.  A right craniectomy defect is identified.  No acute bony abnormalities are present  IMPRESSION: Large acute left subdural hematoma with maximal diameter of 2.3 cm, causing 1.5 cm left-to-right midline shift, right lateral ventricular enlargement and effacement of the basilar cisterns.  Critical Value/emergent results were called by telephone at the time of interpretation on 02/15/2013 at 6:21 PM to Dr.WILEY THUET , who verbally acknowledged these results.   Electronically Signed   By: Laveda Abbe M.D.   On: 02/15/2013 18:21   Dg Chest Portable 1 View  02/15/2013   CLINICAL DATA:  Intubated, orogastric tube  EXAM: PORTABLE CHEST - 1 VIEW  COMPARISON:  None  FINDINGS: The patient is intubated. The tip of the ET tube is 3.5 cm above the carinal. A gastric tube is present. The tip is not identified but lies below the field of view, presumably in the gastric antrum or proximal small bowel. Given portable frontal technique, cardiac contours are within normal limits. The mediastinum is widened and somewhat ill-defined. No definite pleural effusion or pneumothorax. Very low inspiratory volumes with bibasilar and probable perihilar atelectasis. No acute osseous abnormality.  IMPRESSION: 1. Ill-defined and potentially widened mediastinum may simply reflect perihilar atelectasis in the setting of low inspiratory volumes, however acute aortic dissection or other aortic abnormality cannot be excluded by plain film. Chest CTA could further evaluate if clinically warranted. Alternately, if clinical suspicion is low, repeat chest x-ray following improved ventilation may be helpful. 2. The tip of the ET tube is in good position 3.5 cm above the carina. These results were called by telephone at the time of interpretation on  02/15/2013 at 6:15 PM to Dr. Dione Booze , who verbally acknowledged these results.   Electronically Signed   By: Malachy Moan M.D.   On: 02/15/2013 18:22    Review of Systems  Unable to perform ROS  Blood pressure 166/48, pulse 63, resp. rate 20, SpO2 98.00%. Physical Exam  Constitutional: He appears well-developed.  HENT:  Head: Normocephalic and atraumatic.  Neurological: GCS eye subscore is 1. GCS verbal subscore is 1. GCS motor subscore is 2.  Patient pupils are fixed and dilated weak corneal weak gag extensor posturing  Skin: Skin is warm and dry.    Assessment/Plan: 72 year old gentleman presents with a large acute left subdural hematoma with 1/2-2 cm midline shift neurologic exam is minimal with fixed and dilated pupils minimal residual brainstem reflexes. I recommended supportive care only to the family we are awaiting his wife's arrival to discuss this further. Will maintain  his blood pressure a Cardene drip. There is apparent no history of trauma however this is a very unusual hemorrhage for a non-traumatic event. Especially in light of the fact that the patient had previously been recovered from a right-sided acute on chronic subdural hematoma. I've it talk to the wife extensively explained the situation that I felt that this was a nonreversible neurologic process that surgery would not benefit. They understand this I brought up the issues of DO NOT RESUSCITATE and the question of whether to extubate. They're considering the latter options of DO NOT RESUSCITATE and extubation. We will proceed forward supportive nonsurgical management patient is currently on the ventilator so we will consult critical care to see if there we will and admit him in the ICU and control blood pressure and heart rate his family considers whether we extubate. Again I think this is a very grave prognosis with no chance of survival and patient is 72 years old having his second subdural in 4 months and this  one being a large acute subdural with minimal neurologic exam  Tiajuana Leppanen P 02/15/2013, 7:09 PM

## 2013-02-15 NOTE — Progress Notes (Signed)
eLink Physician-Brief Progress Note Patient Name: Austin Salas DOB: 10-07-1940 MRN: [id number]  Date of Service  02/15/2013   HPI/Events of Note   resp acidosis on vent  eICU Interventions  Increase rr to 20   Intervention Category Major Interventions: Acid-Base disturbance - evaluation and management  MCQUAID, DOUGLAS 02/15/2013, 10:39 PM

## 2013-02-15 NOTE — ED Notes (Signed)
All belongings given to neice

## 2013-02-15 NOTE — ED Notes (Signed)
Family at bedside. 

## 2013-02-15 NOTE — ED Notes (Signed)
Dr speaking with family.

## 2013-02-15 NOTE — ED Notes (Signed)
Pulmonary CC MD at bedside performing assesment on pt.

## 2013-02-15 NOTE — H&P (Signed)
PULMONARY  / CRITICAL CARE MEDICINE  Name: Austin Salas MRN: [id number] DOB: 1940-06-09    ADMISSION DATE:  02/15/2013  REFERRING MD :  EDP PRIMARY SERVICE: PCCM   BRIEF PATIENT DESCRIPTION:  72 M s/p recent craniotomy for R sided SDH. Recovered well from that event. Admitted with large L sided SDH, severe cerebral edema, coma. Inoperable  SIGNIFICANT EVENTS / STUDIES:  11/24 CT head: Large acute left subdural hematoma with maximal diameter of 2.3 cm, causing 1.5 cm left-to-right midline shift, right lateral ventricular enlargement and effacement of the basilar cisterns. 11/24 NS consult Wynetta Emery): very grave prognosis with no chance of survival and patient is 72 years old having his second subdural in 4 months and this one being a large acute subdural with minimal neurologic exam. Not deemed recommended. Recommended supportive care only and DNR 11/24 Made DNR after PCCM discussion with wife  LINES / TUBES: ETT 11/24 >>   CULTURES:   ANTIBIOTICS:   HISTORY OF PRESENT ILLNESS:   His recent hospitalization has been reviewed and is well documented. He returned to work recently. On the day of admission, he returned from work feeling poorly and shortly thereafter suffered syncopal episode. Brought to [facility] ED by EMS where he required intubation. Emergent CT head revealed large L SDH with severe midline shift. Seen by NS Wynetta Emery) and not deemed to be operable. Wife understands grave prognosis but is not willing to DC vent presently.  Therefore, PCCM asked to admit  PAST MEDICAL HISTORY :  Past Medical History  Diagnosis Date  . Diabetes mellitus without complication   . Hyperlipidemia   . RBBB    Past Surgical History  Procedure Laterality Date  . Craniotomy Right 08/19/2012    Procedure: Right CRANIOTOMY for HEMATOMA EVACUATION SUBDURAL;  Surgeon: Mariam Dollar, MD;  Location: [facility];  Service: Neurosurgery;  Laterality: Right;  . Craniotomy N/A 08/24/2012    Procedure:  CRANIECTOMY HEMATOMA RE-EVACUATION SUBDURAL;  Surgeon: Barnett Abu, MD;  Location: [facility];  Service: Neurosurgery;  Laterality: N/A;   Prior to Admission medications   Medication Sig Start Date End Date Taking? Authorizing Provider  amLODipine-benazepril (LOTREL) 10-20 MG per capsule Take 1 capsule by mouth 2 (two) times daily.   Yes Historical Provider, MD  aspirin 81 MG chewable tablet Chew 81 mg by mouth daily.   Yes Historical Provider, MD  glimepiride (AMARYL) 2 MG tablet Take 2 mg by mouth daily before breakfast.   Yes Historical Provider, MD  pioglitazone (ACTOS) 45 MG tablet Take 45 mg by mouth daily.   Yes Historical Provider, MD  rosuvastatin (CRESTOR) 10 MG tablet Take 10 mg by mouth daily.   Yes Historical Provider, MD  valsartan-hydrochlorothiazide (DIOVAN-HCT) 320-25 MG per tablet Take 1 tablet by mouth daily.   Yes Historical Provider, MD   No Known Allergies  FAMILY HISTORY:  Family History  Problem Relation Age of Onset  . Diabetes Brother    SOCIAL HISTORY:  reports that he has never smoked. He has never used smokeless tobacco. He reports that he does not drink alcohol or use illicit drugs.  REVIEW OF SYSTEMS:  N/A  SUBJECTIVE:   VITAL SIGNS: Temp:  [99.9 F (37.7 C)-100.8 F (38.2 C)] 100.8 F (38.2 C) (11/24 2300) Pulse Rate:  [63-105] 74 (11/24 2300) Resp:  [15-26] 16 (11/24 2300) BP: (131-203)/(39-73) 131/49 mmHg (11/24 2300) SpO2:  [98 %-100 %] 100 % (11/24 2300) FiO2 (%):  [60 %-100 %] 60 % (11/24 2130)  Weight:  [119.9 kg (264 lb 5.3 oz)] 119.9 kg (264 lb 5.3 oz) (11/24 2130) HEMODYNAMICS:   VENTILATOR SETTINGS: Vent Mode:  [-] PRVC FiO2 (%):  [60 %-100 %] 60 % Set Rate:  [16 bmp-20 bmp] 16 bmp Vt Set:  [600 mL] 600 mL PEEP:  [5 cmH20] 5 cmH20 Plateau Pressure:  [23 cmH20-25 cmH20] 23 cmH20 INTAKE / OUTPUT: Intake/Output     11/24 0701 - 11/25 0700   I.V. (mL/kg) 389.6 (3.2)   Total Intake(mL/kg) 389.6 (3.2)   Urine (mL/kg/hr) 600    Total Output 600   Net -210.4         PHYSICAL EXAMINATION: General:  Comatose Neuro: Comatose, GCS 4, extensor posturing of UEs, withdraws BLEs, pupils don't react, corneals intact, spontaneous cough present HEENT: Cranial defect on R side from prior surgery noted. No evidence of trauma.  Cardiovascular: RRR s M Lungs: scattered rhonchi Abdomen: obese, soft, NT, +BS Ext: warm, no edema   LABS:  CBC  I have reviewed all of today's lab results. Relevant abnormalities are discussed in the A/P section   CXR: Mild indistinctness on perihilar and mediastinal shadows  ASSESSMENT / PLAN:  PULMONARY A:  Acute resp failure due to neurological catastophe P:   Vent settings established Vent bundle implemented Daily SBT if/when indicated   CARDIOVASCULAR A:  Hypertension P:  PRN hydralazine to maintain SBP < 170 mmHg PRN metoprolol to maintain HR < 110/min  RENAL A:   CKD, stage III P:   Maintenance IVFs ordered Monitor BMET intermittently Correct electrolytes as indicated   GASTROINTESTINAL A:   No issues P:   SUP: IV PPI Consider TFs AM 11/25 if continued support is planned  HEMATOLOGIC A:   Mild anemia without evidence of acute blood loss P:  Monitor CBC intermittently  INFECTIOUS A:   No issues P:   Micro and abx as above  ENDOCRINE A:   DM II P:   SSI ordered  NEUROLOGIC A:   Massive SDH, inoperable Cerebral edema with L>R midline shift Coma P:   PRN fentanyl and midaz Consider full comfort care and DC of vent if/when wife ready to proceed with such  TODAY'S SUMMARY:  Extended family updated in detail. Wife is not ready to DC vent but is well apprised of the grave prognosis. She agrees that we should not undertake ACLS/CPR in event of cardiac arrest  I have personally obtained a history, examined the patient, evaluated laboratory and imaging results, formulated the assessment and plan and placed orders. CRITICAL CARE: The patient is  critically ill with multiple organ systems failure and requires high complexity decision making for assessment and support, frequent evaluation and titration of therapies, application of advanced monitoring technologies and extensive interpretation of multiple databases. Critical Care Time devoted to patient care services described in this note is 40 minutes.    Billy Fischer, MD ; [facility] [phone].  After 5:30 PM or weekends, call [phone]  Pulmonary and Critical Care Medicine [facility] Pager: [phone]  02/15/2013, 11:20 PM

## 2013-02-15 NOTE — ED Provider Notes (Signed)
CSN: [id number]     Arrival date & time 02/15/13  1709 History   First MD Initiated Contact with Patient 02/15/13 1710     Chief Complaint  Patient presents with  . unresponsive    (Consider location/radiation/quality/duration/timing/severity/associated sxs/prior Treatment) HPI KEYSHAUN Salas is a 72 y.o. male who presented to the ED with EMS for concern of unresponsiveness.  Patient reportedly spoke with friend around [place] and was stating that he was not feeling well.  Friend noted that something sounded off and came to check on him.  Found patient not acting normal self.  EMS called and patient borderline responsive but still talking.  IV access established and patient loaded in to the truck.  Enroute, the patient became fully unresponsive and started posturing.  Attempted intubation but unsuccessful.  Patient requiring assisted ventilation starting just PTA.  Past Medical History  Diagnosis Date  . Diabetes mellitus without complication   . Hyperlipidemia   . RBBB    Past Surgical History  Procedure Laterality Date  . Craniotomy Right 08/19/2012    Procedure: Right CRANIOTOMY for HEMATOMA EVACUATION SUBDURAL;  Surgeon: Mariam Dollar, MD;  Location: [facility];  Service: Neurosurgery;  Laterality: Right;  . Craniotomy N/A 08/24/2012    Procedure: CRANIECTOMY HEMATOMA RE-EVACUATION SUBDURAL;  Surgeon: Barnett Abu, MD;  Location: [facility];  Service: Neurosurgery;  Laterality: N/A;   Family History  Problem Relation Age of Onset  . Diabetes Brother    History  Substance Use Topics  . Smoking status: Never Smoker   . Smokeless tobacco: Never Used  . Alcohol Use: No    Review of Systems  Unable to perform ROS: Acuity of condition    Allergies  Review of patient's allergies indicates no known allergies.  Home Medications   Current Outpatient Rx  Name  Route  Sig  Dispense  Refill  . amLODipine-benazepril (LOTREL) 10-20 MG per capsule   Oral   Take 1 capsule by mouth 2  (two) times daily.         [name] aspirin 81 MG chewable tablet   Oral   Chew 81 mg by mouth daily.         [name] glimepiride (AMARYL) 2 MG tablet   Oral   Take 2 mg by mouth daily before breakfast.         . HYDROcodone-acetaminophen (NORCO/VICODIN) 5-325 MG per tablet   Oral   Take 1 tablet by mouth every 4 (four) hours as needed.   30 tablet   0   . levETIRAcetam (KEPPRA) 500 MG tablet   Oral   Take 1 tablet (500 mg total) by mouth 2 (two) times daily.   60 tablet   1   . pioglitazone (ACTOS) 45 MG tablet   Oral   Take 45 mg by mouth daily.         . rosuvastatin (CRESTOR) 10 MG tablet   Oral   Take 10 mg by mouth daily.         . valsartan-hydrochlorothiazide (DIOVAN-HCT) 320-25 MG per tablet   Oral   Take 1 tablet by mouth daily.          BP 199/73  Pulse 86  Resp 15  SpO2 100% Physical Exam  Nursing note and vitals reviewed. Constitutional: He appears well-developed and well-nourished. He appears distressed.  HENT:  Head: Normocephalic and atraumatic.  Right Ear: External ear normal.  Left Ear: External ear normal.  Mouth/Throat: Oropharynx is clear and moist.  Small amount of blood in mouth  Eyes: Conjunctivae are normal. Right eye exhibits no discharge. Right pupil is not reactive. Left pupil is not reactive.  Neck: Normal range of motion. Neck supple. No tracheal deviation present.  Cardiovascular: Normal rate, regular rhythm and intact distal pulses.   Pulmonary/Chest: Effort normal. No respiratory distress. He has no wheezes. He has no rales.  Abdominal: Soft. He exhibits no distension. There is no tenderness. There is no rebound and no guarding.  Musculoskeletal: Normal range of motion.  Neurological: He is unresponsive. GCS eye subscore is 1. GCS verbal subscore is 1. GCS motor subscore is 2.  Skin: Skin is warm and dry. No rash noted. He is not diaphoretic.    ED Course  Procedures (including critical care time) Labs Review Labs Reviewed   CBC WITH DIFFERENTIAL - Abnormal; Notable for the following:    WBC 17.1 (*)    RBC 4.01 (*)    Hemoglobin 12.3 (*)    HCT 36.3 (*)    Neutrophils Relative % 81 (*)    Neutro Abs 13.8 (*)    Monocytes Absolute 1.2 (*)    All other components within normal limits  COMPREHENSIVE METABOLIC PANEL - Abnormal; Notable for the following:    Sodium 133 (*)    CO2 18 (*)    Glucose, Bld 176 (*)    BUN 37 (*)    Creatinine, Ser 1.60 (*)    Alkaline Phosphatase 144 (*)    GFR calc non Af Amer 41 (*)    GFR calc Af Amer 48 (*)    All other components within normal limits  SALICYLATE LEVEL - Abnormal; Notable for the following:    Salicylate Lvl <2.0 (*)    All other components within normal limits  URINALYSIS, ROUTINE W REFLEX MICROSCOPIC - Abnormal; Notable for the following:    Hgb urine dipstick TRACE (*)    Protein, ur 30 (*)    All other components within normal limits  GLUCOSE, CAPILLARY - Abnormal; Notable for the following:    Glucose-Capillary 165 (*)    All other components within normal limits  POCT I-STAT 3, BLOOD GAS (G3+) - Abnormal; Notable for the following:    pH, Arterial 7.290 (*)    pCO2 arterial 45.2 (*)    pO2, Arterial 165.0 (*)    Acid-base deficit 5.0 (*)    All other components within normal limits  MRSA PCR SCREENING  LACTIC ACID, PLASMA  PROTIME-INR  APTT  ETHANOL  ACETAMINOPHEN LEVEL  URINE RAPID DRUG SCREEN (HOSP PERFORMED)  URINE MICROSCOPIC-ADD ON  PLATELET FUNCTION ASSAY  CBC  BASIC METABOLIC PANEL  TROPONIN I   Imaging Review Ct Head Wo Contrast  02/15/2013   CLINICAL DATA:  72 year old male with altered mental status. History of right subdural hematoma and craniectomy.  EXAM: CT HEAD WITHOUT CONTRAST  TECHNIQUE: Contiguous axial images were obtained from the base of the skull through the vertex without intravenous contrast.  COMPARISON:  12/14/2012  FINDINGS: A new large acute left subdural hematoma is identified with maximal diameter of 2.3  cm. This causes 1.5 cm left to right midline shift, right lateral ventricular enlargement and effacement of the basilar cisterns.  A right craniectomy defect is identified.  No acute bony abnormalities are present  IMPRESSION: Large acute left subdural hematoma with maximal diameter of 2.3 cm, causing 1.5 cm left-to-right midline shift, right lateral ventricular enlargement and effacement of the basilar cisterns.  Critical Value/emergent results were called by  telephone at the time of interpretation on 02/15/2013 at 6:21 PM to Dr.Aleksia Freiman , who verbally acknowledged these results.   Electronically Signed   By: Laveda Abbe M.D.   On: 02/15/2013 18:21   Dg Chest Portable 1 View  02/15/2013   CLINICAL DATA:  Intubated, orogastric tube  EXAM: PORTABLE CHEST - 1 VIEW  COMPARISON:  None  FINDINGS: The patient is intubated. The tip of the ET tube is 3.5 cm above the carinal. A gastric tube is present. The tip is not identified but lies below the field of view, presumably in the gastric antrum or proximal small bowel. Given portable frontal technique, cardiac contours are within normal limits. The mediastinum is widened and somewhat ill-defined. No definite pleural effusion or pneumothorax. Very low inspiratory volumes with bibasilar and probable perihilar atelectasis. No acute osseous abnormality.  IMPRESSION: 1. Ill-defined and potentially widened mediastinum may simply reflect perihilar atelectasis in the setting of low inspiratory volumes, however acute aortic dissection or other aortic abnormality cannot be excluded by plain film. Chest CTA could further evaluate if clinically warranted. Alternately, if clinical suspicion is low, repeat chest x-ray following improved ventilation may be helpful. 2. The tip of the ET tube is in good position 3.5 cm above the carina. These results were called by telephone at the time of interpretation on 02/15/2013 at 6:15 PM to Dr. Dione Booze , who verbally acknowledged these  results.   Electronically Signed   By: Malachy Moan M.D.   On: 02/15/2013 18:22    EKG Interpretation    Date/Time:  Monday February 15 2013 17:13:48 EST Ventricular Rate:  78 PR Interval:  248 QRS Duration: 154 QT Interval:  388 QTC Calculation: 442 R Axis:   82 Text Interpretation:  Sinus rhythm Prolonged PR interval Right bundle branch block Probable inferior infarct, old Probable anterolateral infarct, old When compared with ECG of 08/22/2012, No significant change was found Confirmed by [facility]  MD, DAVID ([id number]) on 02/15/2013 5:32:14 PM            MDM   1. Coma   2. Cerebral edema   3. CKD (chronic kidney disease) stage 3, GFR 30-59 ml/min   4. DM2 (diabetes mellitus, type 2)   5. Subdural hematoma    Austin Salas is a 72 y.o. male with history of SDH earlier this year who presents to the emergency department for concern of unresponsiveness.  On arrival patient fixed and dilated with GCS of 5.  Intubation performed.  CT head showing large acute L SDH.  NSU consulted emergently and at bedside shortly thereafter.  BP controlled with Cardene.  Unfortunately patient with no surgical options at this point in time.  Poor prognosis and likeliness of death discussed with family.  Critical care consulted for admission.  Patient admitted in critical condition.     Arloa Koh, MD 02/16/13 [phone]

## 2013-02-16 ENCOUNTER — Inpatient Hospital Stay (HOSPITAL_COMMUNITY): Payer: BC Managed Care – PPO

## 2013-02-16 LAB — BASIC METABOLIC PANEL
BUN: 35 mg/dL — ABNORMAL HIGH (ref 6–23)
Calcium: 9 mg/dL (ref 8.4–10.5)
Chloride: 100 mEq/L (ref 96–112)
GFR calc Af Amer: 42 mL/min — ABNORMAL LOW (ref >90)
GFR calc non Af Amer: 36 mL/min — ABNORMAL LOW (ref >90)
Glucose, Bld: 145 mg/dL — ABNORMAL HIGH (ref 70–99)
Potassium: 5.8 mEq/L — ABNORMAL HIGH (ref 3.5–5.1)
Sodium: 132 mEq/L — ABNORMAL LOW (ref 135–145)

## 2013-02-16 LAB — TROPONIN I: Troponin I: <0.3 ng/mL (ref <0.30)

## 2013-02-16 LAB — CBC
Hemoglobin: 12.1 g/dL — ABNORMAL LOW (ref 13.0–17.0)
MCH: 30 pg (ref 26.0–34.0)
MCHC: 33.2 g/dL (ref 30.0–36.0)
RBC: 4.04 MIL/uL — ABNORMAL LOW (ref 4.22–5.81)
RDW: 13.3 % (ref 11.5–15.5)

## 2013-02-16 LAB — MRSA PCR SCREENING: MRSA by PCR: NEGATIVE

## 2013-02-16 LAB — GLUCOSE, CAPILLARY
Glucose-Capillary: 149 mg/dL — ABNORMAL HIGH (ref 70–99)
Glucose-Capillary: 110 mg/dL — ABNORMAL HIGH (ref 70–99)

## 2013-02-16 MED ORDER — ACETAMINOPHEN 160 MG/5ML PO SOLN
650.0000 mg | ORAL | Status: DC | PRN
  Administered 2013-02-16 – 2013-03-03 (×15): 650 mg
  Filled 2013-02-16 (×15): qty 20.3

## 2013-02-16 MED ORDER — BIOTENE DRY MOUTH MT LIQD
15.0000 mL | Freq: Two times a day (BID) | OROMUCOSAL | Status: DC
  Administered 2013-02-16 – 2013-03-05 (×34): 15 mL via OROMUCOSAL

## 2013-02-16 MED ORDER — SODIUM CHLORIDE 0.9 % IV SOLN
INTRAVENOUS | Status: DC
  Administered 2013-02-16 – 2013-02-17 (×2): via INTRAVENOUS

## 2013-02-16 MED ORDER — CHLORHEXIDINE GLUCONATE 0.12 % MT SOLN
15.0000 mL | Freq: Two times a day (BID) | OROMUCOSAL | Status: DC
  Administered 2013-02-16 – 2013-03-05 (×35): 15 mL via OROMUCOSAL
  Filled 2013-02-16 (×38): qty 15

## 2013-02-16 MED ORDER — LORAZEPAM 2 MG/ML IJ SOLN
4.0000 mg | Freq: Once | INTRAMUSCULAR | Status: AC
  Administered 2013-02-16: 4 mg via INTRAVENOUS
  Filled 2013-02-16: qty 2

## 2013-02-16 NOTE — Progress Notes (Signed)
Nutrition Brief Note  Chart reviewed. Pt admitted with large acute left SDH. Per MD note/rounds pt with very grave prognosis with no chance of survival.  Pt now DNR, awaiting family decision for comfort care.  No further nutrition interventions warranted at this time.  Please re-consult as needed.   Kendell Bane RD, LDN, CNSC [phone] Pager [phone] After Hours Pager

## 2013-02-16 NOTE — Progress Notes (Signed)
PULMONARY  / CRITICAL CARE MEDICINE  Name: Austin Salas MRN: [id number] DOB: 1940/10/08    ADMISSION DATE:  02/15/2013  REFERRING MD :  EDP PRIMARY SERVICE: PCCM   BRIEF PATIENT DESCRIPTION:  40 M s/p recent craniotomy for R sided SDH. Recovered well from that event. Admitted with large L sided SDH, severe cerebral edema, coma. Inoperable  SIGNIFICANT EVENTS / STUDIES:  11/24 CT head: Large acute left subdural hematoma with maximal diameter of 2.3 cm, causing 1.5 cm left-to-right midline shift, right lateral ventricular enlargement and effacement of the basilar cisterns. 11/24 NS consult Wynetta Emery): very grave prognosis with no chance of survival and patient is 72 years old having his second subdural in 4 months and this one being a large acute subdural with minimal neurologic exam.  Recommended supportive care only and DNR 11/24 Made DNR after PCCM discussion with wife  LINES / TUBES: ETT 11/24 >>   CULTURES:   ANTIBIOTICS:  SUBJECTIVE: Febrile Remains comatose  VITAL SIGNS: Temp:  [99.9 F (37.7 C)-102 F (38.9 C)] 101.3 F (38.5 C) (11/25 0800) Pulse Rate:  [63-133] 77 (11/25 0800) Resp:  [15-27] 20 (11/25 0800) BP: (113-203)/(39-136) 127/53 mmHg (11/25 0910) SpO2:  [98 %-100 %] 100 % (11/25 0800) FiO2 (%):  [40 %-100 %] 40 % (11/25 0910) Weight:  [119.9 kg (264 lb 5.3 oz)] 119.9 kg (264 lb 5.3 oz) (11/24 2130) HEMODYNAMICS:   VENTILATOR SETTINGS: Vent Mode:  [-] PRVC FiO2 (%):  [40 %-100 %] 40 % Set Rate:  [16 bmp-20 bmp] 20 bmp Vt Set:  [600 mL] 600 mL PEEP:  [5 cmH20] 5 cmH20 Plateau Pressure:  [21 cmH20-25 cmH20] 21 cmH20 INTAKE / OUTPUT: Intake/Output     11/24 0701 - 11/25 0700 11/25 0701 - 11/26 0700   I.V. (mL/kg) 979.6 (8.2) 20 (0.2)   NG/GT  60   Total Intake(mL/kg) 979.6 (8.2) 80 (0.7)   Urine (mL/kg/hr) 1125 135 (0.5)   Total Output 1125 135   Net -145.4 -55          PHYSICAL EXAMINATION: General:  Comatose Neuro: Comatose, GCS 4,  extensor posturing of UEs to pain, withdraws BLEs, pupils mid dilatd min RTL, corneals intact, spontaneous cough present HEENT: Cranial defect on R side from prior surgery noted. No evidence of trauma.  Cardiovascular: RRR s M Lungs: scattered rhonchi Abdomen: obese, soft, NT, +BS Ext: warm, no edema   LABS:  CBC  CBC    Component Value Date/Time   WBC 10.6* 02/16/2013 0400   RBC 4.04* 02/16/2013 0400   HGB 12.1* 02/16/2013 0400   HCT 36.5* 02/16/2013 0400   PLT 207 02/16/2013 0400   MCV 90.3 02/16/2013 0400   MCH 30.0 02/16/2013 0400   MCHC 33.2 02/16/2013 0400   RDW 13.3 02/16/2013 0400   LYMPHSABS 2.0 02/15/2013 1740   MONOABS 1.2* 02/15/2013 1740   EOSABS 0.2 02/15/2013 1740   BASOSABS 0.0 02/15/2013 1740     BMET    Component Value Date/Time   NA 132* 02/16/2013 0400   K 5.8* 02/16/2013 0400   CL 100 02/16/2013 0400   CO2 20 02/16/2013 0400   GLUCOSE 145* 02/16/2013 0400   BUN 35* 02/16/2013 0400   CREATININE 1.80* 02/16/2013 0400   CALCIUM 9.0 02/16/2013 0400   GFRNONAA 36* 02/16/2013 0400   GFRAA 42* 02/16/2013 0400       CXR: rotated film, wide mediastinum  ASSESSMENT / PLAN:  PULMONARY A:  Acute resp failure due to neurological catastophe  P:   Vent bundle implemented   CARDIOVASCULAR A:  Hypertension P:  PRN hydralazine to maintain SBP < 170 mmHg PRN metoprolol to maintain HR < 110/min  RENAL A:   CKD, stage III P:   Maintenance IVFs ordered Monitor BMET intermittently Correct electrolytes as indicated   GASTROINTESTINAL A:   No issues P:   SUP: IV PPI   HEMATOLOGIC A:   Mild anemia without evidence of acute blood loss P:  Monitor CBC intermittently   ENDOCRINE A:   DM II P:   SSI ordered  NEUROLOGIC A:   Massive SDH, inoperable Cerebral edema with L>R midline shift Coma P:   PRN fentanyl  Consider full comfort care and DC of vent if/when wife ready to proceed with such  TODAY'S SUMMARY:   Wife is not  ready to DC vent but is well apprised of the grave prognosis. DNR issued -await family decision on withdrawal vs continued support  I have personally obtained a history, examined the patient, evaluated laboratory and imaging results, formulated the assessment and plan and placed orders. CRITICAL CARE: The patient is critically ill with multiple organ systems failure and requires high complexity decision making for assessment and support, frequent evaluation and titration of therapies, application of advanced monitoring technologies and extensive interpretation of multiple databases. Critical Care Time devoted to patient care services described in this note is 32 minutes.    Cyril Mourning MD. Tonny Bollman. [place] [facility] Pager [phone] If no response call [phone]    02/16/2013, 9:20 AM

## 2013-02-16 NOTE — Progress Notes (Signed)
Late entry: Pt. Was transported to [facility] on 02/15/13 without any complications.

## 2013-02-16 NOTE — Progress Notes (Signed)
tylenolf for fever   Dr. Kalman Shan, M.D., [facility].C.P Pulmonary and Critical Care Medicine Staff Physician  System [place] [facility] Pager: [phone], If no answer or between  15:00h - 7:00h: call [phone]  02/16/2013 1:14 AM

## 2013-02-16 NOTE — Progress Notes (Signed)
Subjective: Patient reports Remains comatose with minimal neurologic exam  Objective: Vital signs in last 24 hours: Temp:  [99.9 F (37.7 C)-102 F (38.9 C)] 101.3 F (38.5 C) (11/25 0600) Pulse Rate:  [63-133] 83 (11/25 0600) Resp:  [15-27] 20 (11/25 0600) BP: (121-203)/(39-136) 123/47 mmHg (11/25 0600) SpO2:  [98 %-100 %] 98 % (11/25 0600) FiO2 (%):  [40 %-100 %] 40 % (11/25 0445) Weight:  [119.9 kg (264 lb 5.3 oz)] 119.9 kg (264 lb 5.3 oz) (11/24 2130)  Intake/Output from previous day: 11/24 0701 - 11/25 0700 In: 959.6 [I.V.:959.6] Out: 1125 [Urine:1125] Intake/Output this shift:    Pupils fixed and dilated corneal on the right no corneal the left weak gag and extensor posturing  Lab Results:  Recent Labs  02/15/13 1740 02/16/13 0400  WBC 17.1* 10.6*  HGB 12.3* 12.1*  HCT 36.3* 36.5*  PLT 194 207   BMET  Recent Labs  02/15/13 1740 02/16/13 0400  NA 133* 132*  K 4.9 5.8*  CL 101 100  CO2 18* 20  GLUCOSE 176* 145*  BUN 37* 35*  CREATININE 1.60* 1.80*  CALCIUM 8.6 9.0    Studies/Results: Ct Head Wo Contrast  02/15/2013   CLINICAL DATA:  72 year old male with altered mental status. History of right subdural hematoma and craniectomy.  EXAM: CT HEAD WITHOUT CONTRAST  TECHNIQUE: Contiguous axial images were obtained from the base of the skull through the vertex without intravenous contrast.  COMPARISON:  12/14/2012  FINDINGS: A new large acute left subdural hematoma is identified with maximal diameter of 2.3 cm. This causes 1.5 cm left to right midline shift, right lateral ventricular enlargement and effacement of the basilar cisterns.  A right craniectomy defect is identified.  No acute bony abnormalities are present  IMPRESSION: Large acute left subdural hematoma with maximal diameter of 2.3 cm, causing 1.5 cm left-to-right midline shift, right lateral ventricular enlargement and effacement of the basilar cisterns.  Critical Value/emergent results were called by  telephone at the time of interpretation on 02/15/2013 at 6:21 PM to Dr.WILEY THUET , who verbally acknowledged these results.   Electronically Signed   By: Laveda Abbe M.D.   On: 02/15/2013 18:21   Dg Chest Portable 1 View  02/15/2013   CLINICAL DATA:  Intubated, orogastric tube  EXAM: PORTABLE CHEST - 1 VIEW  COMPARISON:  None  FINDINGS: The patient is intubated. The tip of the ET tube is 3.5 cm above the carinal. A gastric tube is present. The tip is not identified but lies below the field of view, presumably in the gastric antrum or proximal small bowel. Given portable frontal technique, cardiac contours are within normal limits. The mediastinum is widened and somewhat ill-defined. No definite pleural effusion or pneumothorax. Very low inspiratory volumes with bibasilar and probable perihilar atelectasis. No acute osseous abnormality.  IMPRESSION: 1. Ill-defined and potentially widened mediastinum may simply reflect perihilar atelectasis in the setting of low inspiratory volumes, however acute aortic dissection or other aortic abnormality cannot be excluded by plain film. Chest CTA could further evaluate if clinically warranted. Alternately, if clinical suspicion is low, repeat chest x-ray following improved ventilation may be helpful. 2. The tip of the ET tube is in good position 3.5 cm above the carina. These results were called by telephone at the time of interpretation on 02/15/2013 at 6:15 PM to Dr. Dione Booze , who verbally acknowledged these results.   Electronically Signed   By: Malachy Moan M.D.   On: 02/15/2013 18:22  Assessment/Plan: Continue supportive care his family apparently did reach a decision with regard to DO NOT RESUSCITATE last night however they have not yet decided whether to proceed forward extubation. Unfortunately this is a devastating neurologic event with an no open of functional recovery agree with continue supportive care agree with extubation should the family decided  to proceed forward with that and comfort measures.   LOS: 1 day     Jayme Cham P 02/16/2013, 7:29 AM

## 2013-02-16 NOTE — Progress Notes (Signed)
UR completed.  Mckynzi Cammon, RN BSN MHA CCM Trauma/Neuro ICU Case Manager [phone]  

## 2013-02-16 NOTE — Progress Notes (Signed)
Possible brain herniatino with seizures  Plan Ativan 4mg  IV x 1  Dr. Kalman Shan, M.D., [facility].C.P Pulmonary and Critical Care Medicine Staff Physician [facility] [place] [facility] Critical Care Pager: [phone], If no answer or between  15:00h - 7:00h: call [phone]  02/16/2013 4:16 AM

## 2013-02-17 ENCOUNTER — Encounter (HOSPITAL_COMMUNITY): Payer: Self-pay | Admitting: *Deleted

## 2013-02-17 LAB — BASIC METABOLIC PANEL
CO2: 21 mEq/L (ref 19–32)
Calcium: 9.2 mg/dL (ref 8.4–10.5)
Chloride: 107 mEq/L (ref 96–112)
GFR calc non Af Amer: 37 mL/min — ABNORMAL LOW (ref >90)
Glucose, Bld: 156 mg/dL — ABNORMAL HIGH (ref 70–99)
Potassium: 4.4 mEq/L (ref 3.5–5.1)
Sodium: 140 mEq/L (ref 135–145)

## 2013-02-17 LAB — GLUCOSE, CAPILLARY
Glucose-Capillary: 102 mg/dL — ABNORMAL HIGH (ref 70–99)
Glucose-Capillary: 141 mg/dL — ABNORMAL HIGH (ref 70–99)
Glucose-Capillary: 158 mg/dL — ABNORMAL HIGH (ref 70–99)
Glucose-Capillary: 166 mg/dL — ABNORMAL HIGH (ref 70–99)
Glucose-Capillary: 89 mg/dL (ref 70–99)

## 2013-02-17 MED ORDER — INSULIN ASPART 100 UNIT/ML ~~LOC~~ SOLN
0.0000 [IU] | SUBCUTANEOUS | Status: DC
  Administered 2013-02-17: 3 [IU] via SUBCUTANEOUS
  Administered 2013-02-17: 2 [IU] via SUBCUTANEOUS
  Administered 2013-02-17: 3 [IU] via SUBCUTANEOUS
  Administered 2013-02-17 – 2013-02-18 (×2): 2 [IU] via SUBCUTANEOUS
  Administered 2013-02-18 (×2): 3 [IU] via SUBCUTANEOUS
  Administered 2013-02-18 (×2): 2 [IU] via SUBCUTANEOUS
  Administered 2013-02-19: 3 [IU] via SUBCUTANEOUS
  Administered 2013-02-19: 2 [IU] via SUBCUTANEOUS
  Administered 2013-02-19: 3 [IU] via SUBCUTANEOUS
  Administered 2013-02-19: 2 [IU] via SUBCUTANEOUS
  Administered 2013-02-19 – 2013-02-20 (×2): 3 [IU] via SUBCUTANEOUS
  Administered 2013-02-20: 5 [IU] via SUBCUTANEOUS
  Administered 2013-02-20 (×4): 3 [IU] via SUBCUTANEOUS
  Administered 2013-02-21: 5 [IU] via SUBCUTANEOUS
  Administered 2013-02-21 (×4): 3 [IU] via SUBCUTANEOUS
  Administered 2013-02-21 – 2013-02-22 (×2): 5 [IU] via SUBCUTANEOUS
  Administered 2013-02-22 (×3): 3 [IU] via SUBCUTANEOUS
  Administered 2013-02-22: 8 [IU] via SUBCUTANEOUS
  Administered 2013-02-22: 3 [IU] via SUBCUTANEOUS
  Administered 2013-02-22: 2 [IU] via SUBCUTANEOUS
  Administered 2013-02-23 (×3): 3 [IU] via SUBCUTANEOUS
  Administered 2013-02-23 – 2013-02-24 (×3): 5 [IU] via SUBCUTANEOUS
  Administered 2013-02-24: 3 [IU] via SUBCUTANEOUS
  Administered 2013-02-24: 8 [IU] via SUBCUTANEOUS
  Administered 2013-02-24: 3 [IU] via SUBCUTANEOUS
  Administered 2013-02-24: 5 [IU] via SUBCUTANEOUS
  Administered 2013-02-25 (×2): 3 [IU] via SUBCUTANEOUS
  Administered 2013-02-25: 5 [IU] via SUBCUTANEOUS
  Administered 2013-02-25: 3 [IU] via SUBCUTANEOUS
  Administered 2013-02-25: 5 [IU] via SUBCUTANEOUS
  Administered 2013-02-25: 3 [IU] via SUBCUTANEOUS
  Administered 2013-02-26: 5 [IU] via SUBCUTANEOUS
  Administered 2013-02-26 (×2): 3 [IU] via SUBCUTANEOUS
  Administered 2013-02-26: 5 [IU] via SUBCUTANEOUS

## 2013-02-17 MED ORDER — VITAL AF 1.2 CAL PO LIQD
1000.0000 mL | ORAL | Status: DC
  Administered 2013-02-17: 1000 mL
  Filled 2013-02-17 (×3): qty 1000

## 2013-02-17 MED ORDER — FENTANYL BOLUS VIA INFUSION
25.0000 ug | INTRAVENOUS | Status: DC | PRN
  Filled 2013-02-17: qty 50

## 2013-02-17 MED ORDER — AMLODIPINE BESYLATE 10 MG PO TABS
10.0000 mg | ORAL_TABLET | Freq: Every day | ORAL | Status: DC
  Administered 2013-02-17 – 2013-02-23 (×6): 10 mg via ORAL
  Filled 2013-02-17 (×8): qty 1

## 2013-02-17 MED ORDER — FENTANYL CITRATE 0.05 MG/ML IJ SOLN
50.0000 ug | Freq: Once | INTRAMUSCULAR | Status: AC
  Administered 2013-02-17: 50 ug via INTRAVENOUS

## 2013-02-17 MED ORDER — AMLODIPINE 1 MG/ML ORAL SUSPENSION: 10.0000 mg | Freq: Every day | ORAL | Status: DC

## 2013-02-17 MED ORDER — VITAL HIGH PROTEIN PO LIQD
1000.0000 mL | ORAL | Status: DC
  Administered 2013-02-17 – 2013-03-01 (×13): 1000 mL
  Filled 2013-02-17 (×19): qty 1000

## 2013-02-17 MED ORDER — SODIUM CHLORIDE 0.9 % IV SOLN
0.0000 ug/h | INTRAVENOUS | Status: DC
  Administered 2013-02-17: 100 ug/h via INTRAVENOUS
  Administered 2013-02-18: 150 ug/h via INTRAVENOUS
  Administered 2013-02-19: 100 ug/h via INTRAVENOUS
  Administered 2013-02-19: 200 ug/h via INTRAVENOUS
  Administered 2013-02-19: 60 ug/h via INTRAVENOUS
  Administered 2013-02-20 – 2013-02-22 (×3): 70 ug/h via INTRAVENOUS
  Administered 2013-02-24: 60 ug/h via INTRAVENOUS
  Administered 2013-02-25: 50 ug/h via INTRAVENOUS
  Filled 2013-02-17 (×9): qty 50

## 2013-02-17 MED ORDER — PRO-STAT SUGAR FREE PO LIQD
30.0000 mL | Freq: Four times a day (QID) | ORAL | Status: DC
  Administered 2013-02-17 – 2013-02-18 (×8): 30 mL
  Filled 2013-02-17 (×12): qty 30

## 2013-02-17 NOTE — Progress Notes (Signed)
INITIAL NUTRITION ASSESSMENT  DOCUMENTATION CODES Per approved criteria  -Obesity Unspecified   INTERVENTION:  1. D/C Vital AF 1.2  2. Initiate Vital High Protein @ 50 ml/hr via OG tube   3. 30 ml Prostat QID.  At goal rate, tube feeding regimen will provide 1600 kcal, 165 grams of protein, and 1003 ml of H2O.    NUTRITION DIAGNOSIS: Inadequate oral intake related to inability to eat as evidenced by NPO status.  Goal: Enteral nutrition to provide 60-70% of estimated calorie needs (22-25 kcals/kg ideal body weight) and 100% of estimated protein needs, based on ASPEN guidelines for permissive underfeeding in critically ill obese individuals.  Monitor:  Vent status, TF initiation and tolerance, weight trend  Reason for Assessment: Consult received to initiate and manage enteral nutrition support.  72 y.o. male  Admitting Dx: Subdural hematoma  ASSESSMENT: Pt is s/p recent craniotomy for R sided SDH. Pt was admitted with a large acute left subdural hematoma causing cerebral edema with a midline shift. Dramatic improvement today per MD note. Plan to start TF.  Patient is currently intubated on ventilator support.  MV: 11.7 L/min Temp:Temp (24hrs), Avg:100.8 F (38.2 C), Min:100.2 F (37.9 C), Max:101.3 F (38.5 C)  Nutrition-focused physical exam WNL.   Height: Ht Readings from Last 1 Encounters:  02/15/13 5\' 10"  (1.778 m)    Weight: Wt Readings from Last 1 Encounters:  02/15/13 264 lb 5.3 oz (119.9 kg)    Ideal Body Weight: 75.4 kg   % Ideal Body Weight: 159%  Wt Readings from Last 10 Encounters:  02/15/13 264 lb 5.3 oz (119.9 kg)  08/24/12 258 lb 13.1 oz (117.4 kg)  08/24/12 258 lb 13.1 oz (117.4 kg)  08/24/12 258 lb 13.1 oz (117.4 kg)    Usual Body Weight: 258 lb   % Usual Body Weight: 102%  BMI:  Body mass index is 37.93 kg/(m^2).  Estimated Nutritional Needs: Kcal: 2304 Protein: >151 grams Fluid: > 2 L/day  Skin: no issues noted  Diet  Order:   NPO   EDUCATION NEEDS: -No education needs identified at this time   Intake/Output Summary (Last 24 hours) at 02/17/13 0943 Last data filed at 02/17/13 0900  Gross per 24 hour  Intake   1225 ml  Output   2690 ml  Net  -1465 ml    Last BM: PTA   Labs:   Recent Labs Lab 02/15/13 1740 02/16/13 0400  NA 133* 132*  K 4.9 5.8*  CL 101 100  CO2 18* 20  BUN 37* 35*  CREATININE 1.60* 1.80*  CALCIUM 8.6 9.0  GLUCOSE 176* 145*    CBG (last 3)   Recent Labs  02/16/13 1811 02/16/13 2353 02/17/13 0538  GLUCAP 110* 89 102*    Scheduled Meds: . amLODipine  10 mg Oral Daily  . antiseptic oral rinse  15 mL Mouth Rinse q12n4p  . chlorhexidine  15 mL Mouth Rinse BID  . insulin aspart  0-15 Units Subcutaneous Q6H  . pantoprazole (PROTONIX) IV  40 mg Intravenous QHS    Continuous Infusions: . sodium chloride 50 mL/hr at 02/16/13 2000  . feeding supplement (VITAL AF 1.2 CAL)      Past Medical History  Diagnosis Date  . Diabetes mellitus without complication   . Hyperlipidemia   . RBBB     Past Surgical History  Procedure Laterality Date  . Craniotomy Right 08/19/2012    Procedure: Right CRANIOTOMY for HEMATOMA EVACUATION SUBDURAL;  Surgeon: Mariam Dollar, MD;  Location: [facility];  Service: Neurosurgery;  Laterality: Right;  . Craniotomy N/A 08/24/2012    Procedure: CRANIECTOMY HEMATOMA RE-EVACUATION SUBDURAL;  Surgeon: Barnett Abu, MD;  Location: [facility];  Service: Neurosurgery;  Laterality: N/A;    Kendell Bane RD, LDN, CNSC [phone] Pager [phone] After Hours Pager

## 2013-02-17 NOTE — Progress Notes (Signed)
Patient ID: Austin Salas, male   DOB: 06/27/1940, 72 y.o.   MRN: [id number] No new neurologic issues;  DNR per family decision with Dr Wynetta Emery Continue present rx.

## 2013-02-17 NOTE — Progress Notes (Signed)
eLink Physician-Brief Progress Note Patient Name: Austin Salas DOB: [birth year] MRN: [id number]  Date of Service  02/17/2013   HPI/Events of Note   Non-purposeful movements, agitation, thrashing despite prn fentanyl; visualized per camera  eICU Interventions  Low dose fentanyl drip   Intervention Category Minor Interventions: Agitation / anxiety - evaluation and management  Amare Kontos 02/17/2013, 9:15 PM

## 2013-02-17 NOTE — Progress Notes (Addendum)
PULMONARY  / CRITICAL CARE MEDICINE  Name: Austin Salas MRN: [id number] DOB: 1940/07/20    ADMISSION DATE:  02/15/2013  REFERRING MD :  EDP PRIMARY SERVICE: PCCM   BRIEF PATIENT DESCRIPTION:  20 M s/p recent craniotomy for R sided SDH. Recovered well from that event. Admitted with large L sided SDH, severe cerebral edema, coma.   SIGNIFICANT EVENTS / STUDIES:  11/24 CT head: Large acute left subdural hematoma with maximal diameter of 2.3 cm, causing 1.5 cm left-to-right midline shift, right lateral ventricular enlargement and effacement of the basilar cisterns. 11/24 NS consult Wynetta Emery): very grave prognosis with no chance of survival and patient is 72 years old having his second subdural in 4 months and this one being a large acute subdural with minimal neurologic exam.  Recommended supportive care only and DNR 11/24 Made DNR after PCCM discussion with wife  LINES / TUBES: ETT 11/24 >>   CULTURES:   ANTIBIOTICS:  SUBJECTIVE: Low grade fevers + More responsive  VITAL SIGNS: Temp:  [100.2 F (37.9 C)-101.3 F (38.5 C)] 100.2 F (37.9 C) (11/26 0700) Pulse Rate:  [63-95] 66 (11/26 0700) Resp:  [15-24] 15 (11/26 0700) BP: (108-163)/(45-82) 140/52 mmHg (11/26 0700) SpO2:  [97 %-100 %] 100 % (11/26 0700) FiO2 (%):  [30 %-40 %] 30 % (11/26 0320) HEMODYNAMICS:   VENTILATOR SETTINGS: Vent Mode:  [-] PRVC FiO2 (%):  [30 %-40 %] 30 % Set Rate:  [20 bmp] 20 bmp Vt Set:  [600 mL] 600 mL PEEP:  [5 cmH20] 5 cmH20 Plateau Pressure:  [19 cmH20-29 cmH20] 29 cmH20 INTAKE / OUTPUT: Intake/Output     11/25 0701 - 11/26 0700 11/26 0701 - 11/27 0700   I.V. (mL/kg) 1125 (9.4)    NG/GT 60    Total Intake(mL/kg) 1185 (9.9)    Urine (mL/kg/hr) 2825 (1)    Total Output 2825     Net -1640            PHYSICAL EXAMINATION: General:  Comatose Neuro: GCS 11 , follosw commands on right, pupils mid dilated min RTL, corneals intact, spontaneous cough present HEENT: Cranial defect on R  side from prior surgery noted. No evidence of trauma.  Cardiovascular: RRR s M Lungs: scattered rhonchi Abdomen: obese, soft, NT, +BS Ext: warm, no edema   LABS:  CBC  CBC    Component Value Date/Time   WBC 10.6* 02/16/2013 0400   RBC 4.04* 02/16/2013 0400   HGB 12.1* 02/16/2013 0400   HCT 36.5* 02/16/2013 0400   PLT 207 02/16/2013 0400   MCV 90.3 02/16/2013 0400   MCH 30.0 02/16/2013 0400   MCHC 33.2 02/16/2013 0400   RDW 13.3 02/16/2013 0400   LYMPHSABS 2.0 02/15/2013 1740   MONOABS 1.2* 02/15/2013 1740   EOSABS 0.2 02/15/2013 1740   BASOSABS 0.0 02/15/2013 1740     BMET    Component Value Date/Time   NA 132* 02/16/2013 0400   K 5.8* 02/16/2013 0400   CL 100 02/16/2013 0400   CO2 20 02/16/2013 0400   GLUCOSE 145* 02/16/2013 0400   BUN 35* 02/16/2013 0400   CREATININE 1.80* 02/16/2013 0400   CALCIUM 9.0 02/16/2013 0400   GFRNONAA 36* 02/16/2013 0400   GFRAA 42* 02/16/2013 0400       CXR: 11/25 rotated film, wide mediastinum  ASSESSMENT / PLAN:  PULMONARY A:  Acute resp failure due to neurological catastophe P:   SBTs but hold off extubation until reintubation status clarified   CARDIOVASCULAR A:  Hypertension P:  Resume amlodipin 10, hold ACE/ ARB for now PRN hydralazine to maintain SBP < 170 mmHg PRN metoprolol to maintain HR < 110/min  RENAL A:   CKD, stage III P:   hold ACE/ ARB for now Maintenance IVFs ordered Monitor BMET intermittently Correct electrolytes as indicated   GASTROINTESTINAL A:   No issues P:   SUP: IV PPI Start TFs   HEMATOLOGIC A:   Mild anemia without evidence of acute blood loss P:  Monitor CBC intermittently   ENDOCRINE A:   DM II P:   SSI ordered  NEUROLOGIC A:   Massive SDH, inoperable Cerebral edema with L>R midline shift Coma -resolved  P:   PRN fentanyl  Treat fever   TODAY'S SUMMARY:  Dramatic improvement in neuro status. DNR issued -will need further discussion with family  regarding one way extubation vs continued support  Addendum - Spoke to wife & daughter - explained options - tstomy vs one way extubation -they are not ready to decide but seem to lean towards tstomy given his improvement   I have personally obtained a history, examined the patient, evaluated laboratory and imaging results, formulated the assessment and plan and placed orders. CRITICAL CARE: The patient is critically ill with multiple organ systems failure and requires high complexity decision making for assessment and support, frequent evaluation and titration of therapies, application of advanced monitoring technologies and extensive interpretation of multiple databases. Critical Care Time devoted to patient care services described in this note is 32 minutes.    Cyril Mourning MD. Tonny Bollman. [place] [facility] Pager [phone] If no response call [phone]    02/17/2013, 7:54 AM

## 2013-02-17 NOTE — Progress Notes (Signed)
Wean trial stopped and the pt was placed back on the vent at this time due to tachycardia, tachypnea, hypertension, severe agitation, and increased WOB. RT will monitor.

## 2013-02-18 ENCOUNTER — Inpatient Hospital Stay (HOSPITAL_COMMUNITY): Payer: BC Managed Care – PPO

## 2013-02-18 LAB — CBC
MCH: 30.1 pg (ref 26.0–34.0)
MCHC: 32.9 g/dL (ref 30.0–36.0)
Platelets: 177 10*3/uL (ref 150–400)
RBC: 4.05 MIL/uL — ABNORMAL LOW (ref 4.22–5.81)
RDW: 13.8 % (ref 11.5–15.5)

## 2013-02-18 LAB — BASIC METABOLIC PANEL
Calcium: 9 mg/dL (ref 8.4–10.5)
Creatinine, Ser: 2.02 mg/dL — ABNORMAL HIGH (ref 0.50–1.35)
GFR calc Af Amer: 36 mL/min — ABNORMAL LOW (ref >90)
GFR calc non Af Amer: 31 mL/min — ABNORMAL LOW (ref >90)
Glucose, Bld: 142 mg/dL — ABNORMAL HIGH (ref 70–99)
Sodium: 140 mEq/L (ref 135–145)

## 2013-02-18 LAB — GLUCOSE, CAPILLARY
Glucose-Capillary: 151 mg/dL — ABNORMAL HIGH (ref 70–99)
Glucose-Capillary: 140 mg/dL — ABNORMAL HIGH (ref 70–99)
Glucose-Capillary: 160 mg/dL — ABNORMAL HIGH (ref 70–99)

## 2013-02-18 NOTE — Progress Notes (Signed)
Patient ID: Austin Salas, male   DOB: 1941-02-22, 72 y.o.   MRN: [id number] No new neuro issues Management per ccm. DNR per family request

## 2013-02-18 NOTE — Progress Notes (Signed)
PULMONARY  / CRITICAL CARE MEDICINE  Name: Austin Salas MRN: [id number] DOB: 10/22/1940    ADMISSION DATE:  02/15/2013  REFERRING MD :  EDP PRIMARY SERVICE: PCCM   BRIEF PATIENT DESCRIPTION:  72 M s/p recent craniotomy for R sided SDH. Recovered well from that event. Admitted with large L sided SDH, severe cerebral edema, coma.   SIGNIFICANT EVENTS / STUDIES:  11/24 CT head: Large acute left subdural hematoma with maximal diameter of 2.3 cm, causing 1.5 cm left-to-right midline shift, right lateral ventricular enlargement and effacement of the basilar cisterns. 11/24 NS consult Wynetta Emery): very grave prognosis with no chance of survival and patient is 72 years old having his second subdural in 4 months and this one being a large acute subdural with minimal neurologic exam.  Recommended supportive care only and DNR 11/24 Made DNR after PCCM discussion with wife  LINES / TUBES: ETT 11/24 >>   CULTURES: None  ANTIBIOTICS: None  SUBJECTIVE: No events overnight, unresponsive.  VITAL SIGNS: Temp:  [99.3 F (37.4 C)-101.7 F (38.7 C)] 99.9 F (37.7 C) (11/27 0600) Pulse Rate:  [60-135] 60 (11/27 0600) Resp:  [18-37] 20 (11/27 0600) BP: (96-223)/(42-94) 114/48 mmHg (11/27 0600) SpO2:  [78 %-100 %] 99 % (11/27 0600) FiO2 (%):  [30 %] 30 % (11/26 2323) Weight:  [118.8 kg (261 lb 14.5 oz)] 118.8 kg (261 lb 14.5 oz) (11/27 0500) HEMODYNAMICS:   VENTILATOR SETTINGS: Vent Mode:  [-] PRVC FiO2 (%):  [30 %] 30 % Set Rate:  [20 bmp] 20 bmp Vt Set:  [600 mL] 600 mL PEEP:  [5 cmH20] 5 cmH20 Pressure Support:  [5 cmH20] 5 cmH20 Plateau Pressure:  [19 cmH20-20 cmH20] 20 cmH20 INTAKE / OUTPUT: Intake/Output     11/26 0701 - 11/27 0700 11/27 0701 - 11/28 0700   I.V. (mL/kg) 1262.5 (10.6)    NG/GT 1035.8    Total Intake(mL/kg) 2298.3 (19.3)    Urine (mL/kg/hr) 1580 (0.6)    Total Output 1580     Net +718.3           PHYSICAL EXAMINATION: General:  Comatose Neuro: Unresponsive  this AM (some sedation for agitation overnight). HEENT: Cranial defect on R side from prior surgery noted. No evidence of trauma.  Cardiovascular: RRR s M Lungs: scattered rhonchi Abdomen: obese, soft, NT, +BS Ext: warm, no edema  LABS:  CBC  CBC    Component Value Date/Time   WBC 11.5* 02/18/2013 0410   RBC 4.05* 02/18/2013 0410   HGB 12.2* 02/18/2013 0410   HCT 37.1* 02/18/2013 0410   PLT 177 02/18/2013 0410   MCV 91.6 02/18/2013 0410   MCH 30.1 02/18/2013 0410   MCHC 32.9 02/18/2013 0410   RDW 13.8 02/18/2013 0410   LYMPHSABS 2.0 02/15/2013 1740   MONOABS 1.2* 02/15/2013 1740   EOSABS 0.2 02/15/2013 1740   BASOSABS 0.0 02/15/2013 1740     BMET    Component Value Date/Time   NA 140 02/18/2013 0410   K 4.5 02/18/2013 0410   CL 108 02/18/2013 0410   CO2 19 02/18/2013 0410   GLUCOSE 142* 02/18/2013 0410   BUN 57* 02/18/2013 0410   CREATININE 2.02* 02/18/2013 0410   CALCIUM 9.0 02/18/2013 0410   GFRNONAA 31* 02/18/2013 0410   GFRAA 36* 02/18/2013 0410   CXR: 11/25 rotated film, wide mediastinum  ASSESSMENT / PLAN:  PULMONARY A:  Acute resp failure due to neurological catastophe P:   - PS trials, no extubation given mental status. -  Will defer to Dr. Vassie Loll discussion in AM regarding reintubation or trach...etc. - Adjust vent settings. - ABG in AM.  CARDIOVASCULAR A:  Hypertension P:  - Continue amlodipine 10, hold ACE/ ARB for now. - PRN hydralazine to maintain SBP < 170 mmHg. - PRN metoprolol to maintain HR < 110/min.  RENAL A:   CKD, stage III P:   - Hold ACE/ ARB for now - Maintenance IVFs ordered. - Monitor BMET intermittently. - Correct electrolytes as indicated.  GASTROINTESTINAL A:   No issues P:   - SUP: IV PPI. - Continue TF.  HEMATOLOGIC A:   Mild anemia without evidence of acute blood loss P:  - Monitor CBC intermittently.  ENDOCRINE A:   DM II P:   - SSI ordered. - CBGs as ordered.  NEUROLOGIC A:   Massive SDH,  inoperable Cerebral edema with L>R midline shift Coma -resolved P:   - PRN fentanyl. - Treat fever.  TODAY'S SUMMARY:  Sedate overnight, will continue PS trials but no extubation given mental status and need to clarify reintubation.  Will defer to Dr. Reginia Naas discussion with family in AM.  I have personally obtained a history, examined the patient, evaluated laboratory and imaging results, formulated the assessment and plan and placed orders.  CRITICAL CARE: The patient is critically ill with multiple organ systems failure and requires high complexity decision making for assessment and support, frequent evaluation and titration of therapies, application of advanced monitoring technologies and extensive interpretation of multiple databases. Critical Care Time devoted to patient care services described in this note is 35 minutes.   Alyson Reedy, M.D. [facility] Pulmonary/Critical Care Medicine. Pager: [phone]. After hours pager: [phone].

## 2013-02-19 ENCOUNTER — Inpatient Hospital Stay (HOSPITAL_COMMUNITY): Payer: BC Managed Care – PPO

## 2013-02-19 LAB — BLOOD GAS, ARTERIAL
Bicarbonate: 19.8 mEq/L — ABNORMAL LOW (ref 20.0–24.0)
Drawn by: 24513
MECHVT: 600 mL
O2 Saturation: 94.6 %
PEEP: 5 cmH2O
Patient temperature: 98.6
RATE: 20 resp/min
pH, Arterial: 7.375 (ref 7.350–7.450)
pO2, Arterial: 79.3 mmHg — ABNORMAL LOW (ref 80.0–100.0)

## 2013-02-19 LAB — CBC
HCT: 35.4 % — ABNORMAL LOW (ref 39.0–52.0)
Hemoglobin: 11.8 g/dL — ABNORMAL LOW (ref 13.0–17.0)
MCV: 91.2 fL (ref 78.0–100.0)
RBC: 3.88 MIL/uL — ABNORMAL LOW (ref 4.22–5.81)
WBC: 10.4 10*3/uL (ref 4.0–10.5)

## 2013-02-19 LAB — BASIC METABOLIC PANEL
BUN: 72 mg/dL — ABNORMAL HIGH (ref 6–23)
CO2: 20 mEq/L (ref 19–32)
Calcium: 9.1 mg/dL (ref 8.4–10.5)
Chloride: 107 mEq/L (ref 96–112)
Creatinine, Ser: 1.91 mg/dL — ABNORMAL HIGH (ref 0.50–1.35)
Glucose, Bld: 168 mg/dL — ABNORMAL HIGH (ref 70–99)
Potassium: 4.6 mEq/L (ref 3.5–5.1)

## 2013-02-19 LAB — GLUCOSE, CAPILLARY
Glucose-Capillary: 176 mg/dL — ABNORMAL HIGH (ref 70–99)
Glucose-Capillary: 156 mg/dL — ABNORMAL HIGH (ref 70–99)
Glucose-Capillary: 161 mg/dL — ABNORMAL HIGH (ref 70–99)
Glucose-Capillary: 136 mg/dL — ABNORMAL HIGH (ref 70–99)
Glucose-Capillary: 124 mg/dL — ABNORMAL HIGH (ref 70–99)
Glucose-Capillary: 137 mg/dL — ABNORMAL HIGH (ref 70–99)

## 2013-02-19 MED ORDER — MIDAZOLAM HCL 2 MG/2ML IJ SOLN: 2.0000 mg | Freq: Once | INTRAMUSCULAR | Status: AC

## 2013-02-19 MED ORDER — MIDAZOLAM HCL 2 MG/2ML IJ SOLN
INTRAMUSCULAR | Status: AC
  Administered 2013-02-19: 2 mg
  Filled 2013-02-19: qty 2

## 2013-02-19 MED ORDER — ETOMIDATE 2 MG/ML IV SOLN
0.3000 mg/kg | Freq: Once | INTRAVENOUS | Status: AC
  Administered 2013-02-19: 20 mg via INTRAVENOUS
  Filled 2013-02-19: qty 17.58

## 2013-02-19 MED ORDER — FENTANYL CITRATE 0.05 MG/ML IJ SOLN: 25.0000 ug | INTRAMUSCULAR | Status: DC | PRN

## 2013-02-19 MED ORDER — FENTANYL BOLUS VIA INFUSION
100.0000 ug | Freq: Once | INTRAVENOUS | Status: AC
  Administered 2013-02-19: 100 ug via INTRAVENOUS
  Filled 2013-02-19: qty 100

## 2013-02-19 MED ORDER — PRO-STAT SUGAR FREE PO LIQD
30.0000 mL | Freq: Four times a day (QID) | ORAL | Status: DC
  Administered 2013-02-19 – 2013-03-02 (×41): 30 mL
  Filled 2013-02-19 (×49): qty 30

## 2013-02-19 MED ORDER — PANTOPRAZOLE SODIUM 40 MG PO PACK
40.0000 mg | PACK | Freq: Every day | ORAL | Status: DC
  Administered 2013-02-19 – 2013-02-26 (×8): 40 mg
  Filled 2013-02-19 (×9): qty 20

## 2013-02-19 MED ORDER — VECURONIUM BROMIDE 10 MG IV SOLR
10.0000 mg | Freq: Once | INTRAVENOUS | Status: AC
  Filled 2013-02-19: qty 10

## 2013-02-19 MED ORDER — VECURONIUM BROMIDE 10 MG IV SOLR
INTRAVENOUS | Status: AC
  Administered 2013-02-19: 10 mg
  Filled 2013-02-19: qty 10

## 2013-02-19 MED ORDER — FENTANYL BOLUS VIA INFUSION
200.0000 ug | Freq: Once | INTRAVENOUS | Status: AC
  Administered 2013-02-19: 200 ug via INTRAVENOUS
  Filled 2013-02-19: qty 200

## 2013-02-19 NOTE — Procedures (Signed)
Perc trach See full dictation Blood loss 50 cc 6 placed Mcarthur Rossetti. Tyson Alias, MD, FACP Pgr: [phone] [place] [facility]

## 2013-02-19 NOTE — Procedures (Signed)
Bronchoscopy Procedure Note Austin Salas [id number] 04-03-1940  Procedure: Bronchoscopy Indications: To provide visualization during percutaneous tracheostomy  Procedure Details Consent: Risks of procedure as well as the alternatives and risks of each were explained to the (patient/caregiver).  Consent for procedure obtained. Time Out: Verified patient identification, verified procedure, site/side was marked, verified correct patient position, special equipment/implants available, medications/allergies/relevent history reviewed, required imaging and test results available.  Performed  In preparation for procedure, patient was given 100% FiO2 and bronchoscope lubricated. Sedation: Benzodiazepines and Etomidate  Airway entered and the following bronchi were examined: Bronchi.   Procedures performed: percutaneous tracheostomy. Bronchoscope was used to visualize, needle, guidewire, and dilators during procedure and avoid trauma to the posterior wall of the trachea. Position of tracheostomy was confirmed after placement. Bleeding in the airway was suctioned out Bronchoscope removed.    Evaluation Hemodynamic Status: BP stable throughout; O2 sats: stable throughout Patient's Current Condition: stable Specimens:  None Complications: No apparent complications Patient did tolerate procedure well.   ALVA,RAKESH V. 02/19/2013

## 2013-02-19 NOTE — Progress Notes (Signed)
Patient ID: Austin Salas, male   DOB: 04-Jun-1940, 72 y.o.   MRN: [id number] No new neuro changes. Still supportive care only per family request. Continue present rx.

## 2013-02-19 NOTE — Procedures (Signed)
Bedside Tracheostomy Insertion Procedure Note   Patient Details:   Name: Austin Salas DOB: 1940/10/21 MRN: [id number]  Procedure: Tracheostomy  Pre Procedure Assessment: ET Tube Size:7.5  ET Tube secured at lip (cm):25 Bite block in place: No Breath Sounds: Rhonch  Post Procedure Assessment: BP 132/46  Pulse 66  Temp(Src) 99.7 F (37.6 C) (Core (Comment))  Resp 20  Ht 5\' 10"  (1.778 m)  Wt 258 lb 6.1 oz (117.2 kg)  BMI 37.07 kg/m2  SpO2 100% O2 sats: stable throughout Complications: No apparent complications Patient did tolerate procedure well Tracheostomy Brand:Shiley Tracheostomy Style:Cuffed Tracheostomy Size: 6 Tracheostomy Secured QIO:[id number] Tracheostomy Placement Confirmation:Trach cuff visualized and in place and Chest X ray ordered for placement    Cherylin Mylar 02/19/2013, 3:47 PM

## 2013-02-19 NOTE — Progress Notes (Signed)
PULMONARY  / CRITICAL CARE MEDICINE  Name: Austin Salas MRN: [id number] DOB: June 17, 1940    ADMISSION DATE:  02/15/2013  REFERRING MD :  EDP PRIMARY SERVICE: PCCM   BRIEF PATIENT DESCRIPTION:  72 M s/p recent craniotomy for R sided SDH. Recovered well from that event. Admitted with large L sided SDH, severe cerebral edema, coma.   SIGNIFICANT EVENTS / STUDIES:  11/24 CT head: Large acute left subdural hematoma with maximal diameter of 2.3 cm, causing 1.5 cm left-to-right midline shift, right lateral ventricular enlargement and effacement of the basilar cisterns. 11/24 NS consult Wynetta Emery): very grave prognosis with no chance of survival and patient is 72 years old having his second subdural in 4 months and this one being a large acute subdural with minimal neurologic exam.  Recommended supportive care only and DNR 11/24 Made DNR after PCCM discussion with wife  LINES / TUBES: ETT 11/24 >>   CULTURES: None  ANTIBIOTICS: None  SUBJECTIVE: No events overnight, unresponsive on fent gtt Low gr fever  VITAL SIGNS: Temp:  [99.3 F (37.4 C)-100.2 F (37.9 C)] 99.9 F (37.7 C) (11/28 1100) Pulse Rate:  [57-94] 89 (11/28 1100) Resp:  [12-28] 19 (11/28 1100) BP: (100-189)/(36-75) 152/49 mmHg (11/28 1100) SpO2:  [97 %-100 %] 98 % (11/28 1100) FiO2 (%):  [30 %] 30 % (11/28 1100) Weight:  [117.2 kg (258 lb 6.1 oz)] 117.2 kg (258 lb 6.1 oz) (11/28 0356) HEMODYNAMICS:   VENTILATOR SETTINGS: Vent Mode:  [-] CPAP;PSV FiO2 (%):  [30 %] 30 % Set Rate:  [20 bmp] 20 bmp Vt Set:  [600 mL] 600 mL PEEP:  [5 cmH20] 5 cmH20 Pressure Support:  [8 cmH20] 8 cmH20 Plateau Pressure:  [20 cmH20-26 cmH20] 24 cmH20 INTAKE / OUTPUT: Intake/Output     11/27 0701 - 11/28 0700 11/28 0701 - 11/29 0700   I.V. (mL/kg) 1560 (13.3) 227.5 (1.9)   NG/GT 1410 280   Total Intake(mL/kg) 2970 (25.3) 507.5 (4.3)   Urine (mL/kg/hr) 1280 (0.5) 670 (1.4)   Total Output 1280 670   Net +1690 -162.5          PHYSICAL EXAMINATION: General:  Chronically ill, intubated Neuro: Unresponsive this AM (some sedation for agitation overnight). HEENT: Cranial defect on R side from prior surgery noted. No evidence of trauma.  Cardiovascular: RRR s M Lungs: scattered rhonchi Abdomen: obese, soft, NT, +BS Ext: warm, no edema  LABS:  CBC  CBC    Component Value Date/Time   WBC 10.4 02/19/2013 0350   RBC 3.88* 02/19/2013 0350   HGB 11.8* 02/19/2013 0350   HCT 35.4* 02/19/2013 0350   PLT 185 02/19/2013 0350   MCV 91.2 02/19/2013 0350   MCH 30.4 02/19/2013 0350   MCHC 33.3 02/19/2013 0350   RDW 13.9 02/19/2013 0350   LYMPHSABS 2.0 02/15/2013 1740   MONOABS 1.2* 02/15/2013 1740   EOSABS 0.2 02/15/2013 1740   BASOSABS 0.0 02/15/2013 1740     BMET    Component Value Date/Time   NA 140 02/19/2013 0350   K 4.6 02/19/2013 0350   CL 107 02/19/2013 0350   CO2 20 02/19/2013 0350   GLUCOSE 168* 02/19/2013 0350   BUN 72* 02/19/2013 0350   CREATININE 1.91* 02/19/2013 0350   CALCIUM 9.1 02/19/2013 0350   GFRNONAA 33* 02/19/2013 0350   GFRAA 39* 02/19/2013 0350   CXR: 11/25 rotated film, wide mediastinum  ASSESSMENT / PLAN:  PULMONARY A:  Acute resp failure due to neurological catastophe P:   -  PS trials, no extubation given mental status. - Ongoing d/w family about trach  CARDIOVASCULAR A:  Hypertension P:  - Continue amlodipine 10, hold ACE/ ARB for now. - PRN hydralazine to maintain SBP < 170 mmHg. - PRN metoprolol to maintain HR < 110/min.  RENAL A:   CKD, stage III P:   - Hold ACE/ ARB for now - Maintenance IVFs ordered. - Monitor BMET intermittently. - Correct electrolytes as indicated.  GASTROINTESTINAL A:   No issues P:   - SUP: IV PPI. - Continue TF.  HEMATOLOGIC A:   Mild anemia without evidence of acute blood loss P:  - Monitor CBC intermittently.  ENDOCRINE A:   DM II P:   - SSI ordered. - CBGs as ordered.  NEUROLOGIC A:   Massive SDH,  inoperable Cerebral edema with L>R midline shift Coma -resolved P:   - PRN fentanyl. - Treat fever.  TODAY'S SUMMARY:  Continue PS trials but no extubation given mental status - family leaning towards trach, plan for Monday if they have decided over weekend  I have personally obtained a history, examined the patient, evaluated laboratory and imaging results, formulated the assessment and plan and placed orders.  CRITICAL CARE: The patient is critically ill with multiple organ systems failure and requires high complexity decision making for assessment and support, frequent evaluation and titration of therapies, application of advanced monitoring technologies and extensive interpretation of multiple databases. Critical Care Time devoted to patient care services described in this note is 31 minutes.   Cyril Mourning MD. Tonny Bollman. [place] Pager [phone] If no response call [phone]

## 2013-02-19 NOTE — Progress Notes (Signed)
IR aware of request for Gastrostomy tube. Chart reviewed. S/p Tracheostomy today. Currently receiving TF via NGT Will see pt and discuss with family. Tentatively plan for G-tube placement, Mon 12/1.  Brayton El PA-C Interventional Radiology 02/19/2013 3:07 PM

## 2013-02-19 NOTE — Progress Notes (Addendum)
UR completed.  Pt had trach placed today. Await to see how pt weans. He is [employer] which means he would need to fail weaning attempts x3 before being LTACH eligible.  If he is able to wean, home w/ HH vs. SNF vs. CIR would be options depending on progression and abilities.   Carlyle Lipa, RN BSN MHA CCM Trauma/Neuro ICU Case Manager [phone]

## 2013-02-20 DIAGNOSIS — Z930 Tracheostomy status: Secondary | ICD-10-CM

## 2013-02-20 LAB — CBC
HCT: 35.5 % — ABNORMAL LOW (ref 39.0–52.0)
MCH: 29.5 pg (ref 26.0–34.0)
MCHC: 32.1 g/dL (ref 30.0–36.0)
MCV: 91.7 fL (ref 78.0–100.0)
Platelets: 190 10*3/uL (ref 150–400)
RBC: 3.87 MIL/uL — ABNORMAL LOW (ref 4.22–5.81)
RDW: 13.8 % (ref 11.5–15.5)
WBC: 9.4 10*3/uL (ref 4.0–10.5)

## 2013-02-20 LAB — GLUCOSE, CAPILLARY
Glucose-Capillary: 174 mg/dL — ABNORMAL HIGH (ref 70–99)
Glucose-Capillary: 163 mg/dL — ABNORMAL HIGH (ref 70–99)
Glucose-Capillary: 175 mg/dL — ABNORMAL HIGH (ref 70–99)

## 2013-02-20 LAB — BASIC METABOLIC PANEL
BUN: 70 mg/dL — ABNORMAL HIGH (ref 6–23)
CO2: 20 mEq/L (ref 19–32)
Calcium: 9 mg/dL (ref 8.4–10.5)
Chloride: 112 mEq/L (ref 96–112)
Creatinine, Ser: 1.92 mg/dL — ABNORMAL HIGH (ref 0.50–1.35)
GFR calc non Af Amer: 33 mL/min — ABNORMAL LOW (ref >90)
Glucose, Bld: 200 mg/dL — ABNORMAL HIGH (ref 70–99)
Sodium: 146 mEq/L — ABNORMAL HIGH (ref 135–145)

## 2013-02-20 MED ORDER — WHITE PETROLATUM GEL
Status: AC
  Administered 2013-02-20: 0.2
  Filled 2013-02-20: qty 5

## 2013-02-20 MED ORDER — KETOROLAC TROMETHAMINE 15 MG/ML IJ SOLN
15.0000 mg | Freq: Four times a day (QID) | INTRAMUSCULAR | Status: DC
  Filled 2013-02-20 (×3): qty 1

## 2013-02-20 NOTE — Progress Notes (Signed)
HPI: Austin Salas is an 72 y.o. male who recently underwent craniotomy for a right sided SDH but is now admitted with a large left sided subdural hematoma, severe cerebral edema, not operable. Apparently grave prognosis, respiratory failure. Is now s/p tracheostomy and family wishes to pursue gastrostomy tube placement for long term feeding needs. Currently has Dobhoff tube and receiving TF at 50/hr Per RN and flowsheet, no recent BM.  Past Medical History:  Past Medical History  Diagnosis Date  . Diabetes mellitus without complication   . Hyperlipidemia   . RBBB     Surgical History:  Past Surgical History  Procedure Laterality Date  . Craniotomy Right 08/19/2012    Procedure: Right CRANIOTOMY for HEMATOMA EVACUATION SUBDURAL;  Surgeon: Mariam Dollar, MD;  Location: [facility];  Service: Neurosurgery;  Laterality: Right;  . Craniotomy N/A 08/24/2012    Procedure: CRANIECTOMY HEMATOMA RE-EVACUATION SUBDURAL;  Surgeon: Barnett Abu, MD;  Location: [facility];  Service: Neurosurgery;  Laterality: N/A;    Family History:  Family History  Problem Relation Age of Onset  . Diabetes Brother     Social History:  reports that he has never smoked. He has never used smokeless tobacco. He reports that he does not drink alcohol or use illicit drugs.  Allergies: No Known Allergies  Medications:  Current facility-administered medications:0.9 %  sodium chloride infusion, 250 mL, Intravenous, PRN, Merwyn Katos, MD, Last Rate: 20 mL/hr at 02/20/13 0600, 250 mL at 02/20/13 0600;  acetaminophen (TYLENOL) solution 650 mg, 650 mg, Per Tube, Q4H PRN, Kalman Shan, MD, 650 mg at 02/19/13 1946;  amLODipine (NORVASC) tablet 10 mg, 10 mg, Oral, Daily, Merwyn Katos, MD, 10 mg at 02/19/13 9528 antiseptic oral rinse (BIOTENE) solution 15 mL, 15 mL, Mouth Rinse, q12n4p, Kalman Shan, MD, 15 mL at 02/19/13 1600;  chlorhexidine (PERIDEX) 0.12 % solution 15 mL, 15 mL, Mouth Rinse, BID, Kalman Shan, MD, 15 mL at 02/20/13 0839;  feeding supplement (PRO-STAT SUGAR FREE 64) liquid 30 mL, 30 mL, Per Tube, QID, Oretha Milch, MD, 30 mL at 02/20/13 0840 feeding supplement (VITAL HIGH PROTEIN) liquid 1,000 mL, 1,000 mL, Per Tube, Continuous, Heather Cornelison Pitts, RD, Last Rate: 50 mL/hr at 02/20/13 0600, 1,000 mL at 02/20/13 0600;  fentaNYL (SUBLIMAZE) 10 mcg/mL in sodium chloride 0.9 % 250 mL infusion, 0-70 mcg/hr, Intravenous, Continuous, Oretha Milch, MD, Last Rate: 7 mL/hr at 02/20/13 0600, 70 mcg/hr at 02/20/13 0600 fentaNYL (SUBLIMAZE) injection 25-100 mcg, 25-100 mcg, Intravenous, Q2H PRN, Merwyn Katos, MD, 100 mcg at 02/17/13 1952;  hydrALAZINE (APRESOLINE) injection 10-40 mg, 10-40 mg, Intravenous, Q4H PRN, Merwyn Katos, MD;  insulin aspart (novoLOG) injection 0-15 Units, 0-15 Units, Subcutaneous, Q4H, Oretha Milch, MD, 3 Units at 02/20/13 0838 metoprolol (LOPRESSOR) injection 2.5-5 mg, 2.5-5 mg, Intravenous, Q3H PRN, Merwyn Katos, MD, 5 mg at 02/17/13 1345;  pantoprazole sodium (PROTONIX) 40 mg/20 mL oral suspension 40 mg, 40 mg, Per Tube, Q1200, Oretha Milch, MD, 40 mg at 02/19/13 1715   ROS: See HPI for pertinent findings, otherwise complete 10 system review negative.  Physical Exam: Blood pressure 136/46, pulse 110, temperature 100.9 F (38.3 C), temperature source Core (Comment), resp. rate 21, height 5\' 10"  (1.778 m), weight 259 lb 11.2 oz (117.8 kg), SpO2 100.00%. On vent via trach. Agitated but not responsive. Lungs: clear BS, mildly diminished at bases. Heart: tachy reg Abdomen: soft but protuberant, possible distended but probable pt normal body  habitus. Bowel sounds quiet    Labs: CBC  Recent Labs  02/19/13 0350 02/20/13 0415  WBC 10.4 9.4  HGB 11.8* 11.4*  HCT 35.4* 35.5*  PLT 185 190   MET  Recent Labs  02/19/13 0350 02/20/13 0415  NA 140 146*  K 4.6 4.3  CL 107 112  CO2 20 20  GLUCOSE 168* 200*  BUN 72* 70*  CREATININE 1.91*  1.92*  CALCIUM 9.1 9.0   No results found for this basename: PROT, ALBUMIN, AST, ALT, ALKPHOS, BILITOT, BILIDIR, IBILI, LIPASE,  in the last 72 hours PT/INR No results found for this basename: LABPROT, INR,  in the last 72 hours ABG  Recent Labs  02/19/13 0429  PHART 7.375  HCO3 19.8*      Chest Portable 1 View To Assess Tube Placement And Rule-out Pneumothorax  02/19/2013   CLINICAL DATA:  Post tracheostomy  EXAM: PORTABLE CHEST - 1 VIEW  COMPARISON:  Portable exam 1339 hr compared to 0619 hr  FINDINGS: New tracheostomy tube with tip projecting over tracheal air column 4.7 cm above carinal.  Nasogastric tube extends into stomach.  Upper normal heart size.  Atherosclerotic calcification aorta.  Right basilar atelectasis.  Increased atelectasis versus consolidation in left lower lobe.  Upper lungs clear.  No pneumothorax.  IMPRESSION: No pneumothorax following tracheostomy tube placement.  Right basilar atelectasis with increased atelectasis versus consolidation in left lower lobe.   Electronically Signed   By: Ulyses Southward M.D.   On: 02/19/2013 13:48   Dg Chest Port 1 View  02/19/2013   CLINICAL DATA:  Intubation, ventilatory failure  EXAM: PORTABLE CHEST - 1 VIEW  COMPARISON:  02/18/2013  FINDINGS: Endotracheal tube 23 mm above the carina. NG tube enters the stomach with tip not visualized on the film. Cardiomegaly noted with low lung volumes and vascular congestion. Minor basilar atelectasis. No large effusion or pneumothorax. No significant interval change.  IMPRESSION: Stable cardiomegaly with vascular congestion and atelectasis.  Stable support apparatus   Electronically Signed   By: Ruel Favors M.D.   On: 02/19/2013 07:44   Dg Abd Portable 1v  02/19/2013   CLINICAL DATA:  Feeding tube placement  EXAM: PORTABLE ABDOMEN - 1 VIEW  COMPARISON:  None.  FINDINGS: A weighted tip of it enteric feeding tube projects over the gastric body. The bowel gas pattern is not obstructed. Mild  bibasilar opacities in the visualized chest. No acute osseous abnormality.  IMPRESSION: The tip of the enteric feeding tube projects over the gastric body. Recommend advancing if the intention is for transpyloric placement.   Electronically Signed   By: Malachy Moan M.D.   On: 02/19/2013 16:16    Assessment/Plan: Massive SDH, inoperable. Cerebral edema with L>R midline shift. Resp failure s/p trach. FTT--family wishes to pursue gastrostomy for continued nutritional needs. Questionable abd distention and no recent BM concerning, though abd xray yesterday showed no evidence of ileus/obstruction Fevers, likely neurogenic as no other apparent infectious issues. No family in room today. Will discuss with family and likely proceed with G-tube on 12/1 and significant change in status or family decision.    Brayton El PA-C 02/20/2013, 9:09 AM

## 2013-02-20 NOTE — Progress Notes (Signed)
PULMONARY  / CRITICAL CARE MEDICINE  Name: Austin Salas MRN: [id number] DOB: Apr 27, 1940    ADMISSION DATE:  02/15/2013  REFERRING MD :  EDP PRIMARY SERVICE: PCCM   BRIEF PATIENT DESCRIPTION:  72 M s/p recent craniotomy for R sided SDH. Recovered well from that event. Admitted with large L sided SDH, severe cerebral edema, coma.   SIGNIFICANT EVENTS / STUDIES:  11/24 CT head: Large acute left subdural hematoma with maximal diameter of 2.3 cm, causing 1.5 cm left-to-right midline shift, right lateral ventricular enlargement and effacement of the basilar cisterns. 11/24 NS consult Wynetta Emery): very grave prognosis with no chance of survival and patient is 72 years old having his second subdural in 4 months and this one being a large acute subdural with minimal neurologic exam.  Recommended supportive care only and DNR 11/24 Made DNR after PCCM discussion with wife  LINES / TUBES: ETT 11/24 >> 11/28 Trach (JY) 11/28>>>  CULTURES: None  ANTIBIOTICS: None  SUBJECTIVE: Febrile overnight.  VITAL SIGNS: Temp:  [99.5 F (37.5 C)-101.8 F (38.8 C)] 101.7 F (38.7 C) (11/29 1000) Pulse Rate:  [57-116] 80 (11/29 1000) Resp:  [18-29] 27 (11/29 1000) BP: (103-193)/(41-76) 124/48 mmHg (11/29 1000) SpO2:  [95 %-100 %] 95 % (11/29 1000) FiO2 (%):  [30 %] 30 % (11/29 1000) Weight:  [117.8 kg (259 lb 11.2 oz)] 117.8 kg (259 lb 11.2 oz) (11/29 0402) HEMODYNAMICS:   VENTILATOR SETTINGS: Vent Mode:  [-] PSV;CPAP FiO2 (%):  [30 %] 30 % Set Rate:  [20 bmp] 20 bmp Vt Set:  [600 mL] 600 mL PEEP:  [5 cmH20] 5 cmH20 Pressure Support:  [8 cmH20] 8 cmH20 Plateau Pressure:  [22 cmH20-24 cmH20] 23 cmH20 INTAKE / OUTPUT: Intake/Output     11/28 0701 - 11/29 0700 11/29 0701 - 11/30 0700   I.V. (mL/kg) 733 (6.2) 81 (0.7)   NG/GT 1067.5 150   Total Intake(mL/kg) 1800.5 (15.3) 231 (2)   Urine (mL/kg/hr) 2520 (0.9) 500 (0.9)   Total Output 2520 500   Net -719.5 -269         PHYSICAL  EXAMINATION: General:  Chronically ill, trached Neuro: Unresponsive this AM (some sedation for agitation overnight). HEENT: Cranial defect on R side from prior surgery noted. No evidence of trauma.  Cardiovascular: RRR s M Lungs: scattered rhonchi Abdomen: obese, soft, NT, +BS Ext: warm, no edema  LABS:  CBC  CBC    Component Value Date/Time   WBC 9.4 02/20/2013 0415   RBC 3.87* 02/20/2013 0415   HGB 11.4* 02/20/2013 0415   HCT 35.5* 02/20/2013 0415   PLT 190 02/20/2013 0415   MCV 91.7 02/20/2013 0415   MCH 29.5 02/20/2013 0415   MCHC 32.1 02/20/2013 0415   RDW 13.8 02/20/2013 0415   LYMPHSABS 2.0 02/15/2013 1740   MONOABS 1.2* 02/15/2013 1740   EOSABS 0.2 02/15/2013 1740   BASOSABS 0.0 02/15/2013 1740     BMET    Component Value Date/Time   NA 146* 02/20/2013 0415   K 4.3 02/20/2013 0415   CL 112 02/20/2013 0415   CO2 20 02/20/2013 0415   GLUCOSE 200* 02/20/2013 0415   BUN 70* 02/20/2013 0415   CREATININE 1.92* 02/20/2013 0415   CALCIUM 9.0 02/20/2013 0415   GFRNONAA 33* 02/20/2013 0415   GFRAA 39* 02/20/2013 0415   CXR: 11/25 rotated film, wide mediastinum  ASSESSMENT / PLAN:  PULMONARY A:  Acute resp failure due to neurological catastophe.  Trached 11/28. P:   - PS  trials as able, not able to go to [facility] yet. - BD as ordered. - Titrate O2 for sat.  CARDIOVASCULAR A:  Hypertension P:  - Continue amlodipine 10, hold ACE/ ARB for now. - PRN hydralazine to maintain SBP < 170 mmHg. - PRN metoprolol to maintain HR < 110/min.  RENAL A:   CKD, stage III.  Hypernatremic. P:   - Hold ACE/ ARB for now - Maintenance IVFs ordered. - Monitor BMET intermittently. - Correct electrolytes as indicated.  GASTROINTESTINAL A:   No issues P:   - SUP: IV PPI. - Continue TF.  HEMATOLOGIC A:   Mild anemia without evidence of acute blood loss P:  - Monitor CBC intermittently.  ENDOCRINE A:   DM II P:   - SSI ordered. - CBGs as  ordered.  NEUROLOGIC A:   Massive SDH, inoperable Cerebral edema with L>R midline shift Coma -resolved P:   - PRN fentanyl. - Treat fever.  TODAY'S SUMMARY:  Continue PS as tolerated, no TC for now.  Will need peg prior to discharge.  I have personally obtained a history, examined the patient, evaluated laboratory and imaging results, formulated the assessment and plan and placed orders.  CRITICAL CARE: The patient is critically ill with multiple organ systems failure and requires high complexity decision making for assessment and support, frequent evaluation and titration of therapies, application of advanced monitoring technologies and extensive interpretation of multiple databases. Critical Care Time devoted to patient care services described in this note is 35 minutes.   Alyson Reedy, M.D. [facility] Pulmonary/Critical Care Medicine. Pager: [phone]. After hours pager: [phone].

## 2013-02-20 NOTE — Progress Notes (Signed)
eLink Physician-Brief Progress Note Patient Name: Austin Salas DOB: 12-12-1940 MRN: [id number]  Date of Service  02/20/2013   HPI/Events of Note   Delirium, reaching for trach.  eICU Interventions   Restraint order provided.    Intervention Category Minor Interventions: Agitation / anxiety - evaluation and management  Esaw Knippel R. 02/20/2013, 2:18 AM

## 2013-02-20 NOTE — Progress Notes (Signed)
Subjective: Patient reports Minimal responsiveness noted.  Objective: Vital signs in last 24 hours: Temp:  [99.5 F (37.5 C)-101.8 F (38.8 C)] 101.7 F (38.7 C) (11/29 1000) Pulse Rate:  [57-116] 80 (11/29 1000) Resp:  [18-29] 27 (11/29 1000) BP: (103-193)/(41-76) 124/48 mmHg (11/29 1000) SpO2:  [95 %-100 %] 95 % (11/29 1000) FiO2 (%):  [30 %] 30 % (11/29 1000) Weight:  [117.8 kg (259 lb 11.2 oz)] 117.8 kg (259 lb 11.2 oz) (11/29 0402)  Intake/Output from previous day: 11/28 0701 - 11/29 0700 In: 1800.5 [I.V.:733; NG/GT:1067.5] Out: 2520 [Urine:2520] Intake/Output this shift: Total I/O In: 231 [I.V.:81; NG/GT:150] Out: -   Pupils asymmetric 4 mm and 3 mm right left nonreactive.  Lab Results:  Recent Labs  02/19/13 0350 02/20/13 0415  WBC 10.4 9.4  HGB 11.8* 11.4*  HCT 35.4* 35.5*  PLT 185 190   BMET  Recent Labs  02/19/13 0350 02/20/13 0415  NA 140 146*  K 4.6 4.3  CL 107 112  CO2 20 20  GLUCOSE 168* 200*  BUN 72* 70*  CREATININE 1.91* 1.92*  CALCIUM 9.1 9.0    Studies/Results: Chest Portable 1 View To Assess Tube Placement And Rule-out Pneumothorax  02/19/2013   CLINICAL DATA:  Post tracheostomy  EXAM: PORTABLE CHEST - 1 VIEW  COMPARISON:  Portable exam 1339 hr compared to 0619 hr  FINDINGS: New tracheostomy tube with tip projecting over tracheal air column 4.7 cm above carinal.  Nasogastric tube extends into stomach.  Upper normal heart size.  Atherosclerotic calcification aorta.  Right basilar atelectasis.  Increased atelectasis versus consolidation in left lower lobe.  Upper lungs clear.  No pneumothorax.  IMPRESSION: No pneumothorax following tracheostomy tube placement.  Right basilar atelectasis with increased atelectasis versus consolidation in left lower lobe.   Electronically Signed   By: Ulyses Southward M.D.   On: 02/19/2013 13:48   Dg Chest Port 1 View  02/19/2013   CLINICAL DATA:  Intubation, ventilatory failure  EXAM: PORTABLE CHEST - 1 VIEW   COMPARISON:  02/18/2013  FINDINGS: Endotracheal tube 23 mm above the carina. NG tube enters the stomach with tip not visualized on the film. Cardiomegaly noted with low lung volumes and vascular congestion. Minor basilar atelectasis. No large effusion or pneumothorax. No significant interval change.  IMPRESSION: Stable cardiomegaly with vascular congestion and atelectasis.  Stable support apparatus   Electronically Signed   By: Ruel Favors M.D.   On: 02/19/2013 07:44   Dg Abd Portable 1v  02/19/2013   CLINICAL DATA:  Feeding tube placement  EXAM: PORTABLE ABDOMEN - 1 VIEW  COMPARISON:  None.  FINDINGS: A weighted tip of it enteric feeding tube projects over the gastric body. The bowel gas pattern is not obstructed. Mild bibasilar opacities in the visualized chest. No acute osseous abnormality.  IMPRESSION: The tip of the enteric feeding tube projects over the gastric body. Recommend advancing if the intention is for transpyloric placement.   Electronically Signed   By: Malachy Moan M.D.   On: 02/19/2013 16:16    Assessment/Plan: Large left colo-hemispheric subdural hematoma with significant shift  LOS: 5 days  Course of action chosen at time of admission was to [facility] pattient DO NOT RESUSCITATE. He remained somewhat current time.   Austin Salas 02/20/2013, 10:59 AM

## 2013-02-21 LAB — PHOSPHORUS: Phosphorus: 4 mg/dL (ref 2.3–4.6)

## 2013-02-21 LAB — CBC
MCHC: 32.6 g/dL (ref 30.0–36.0)
MCV: 90.2 fL (ref 78.0–100.0)
Platelets: 188 10*3/uL (ref 150–400)
RBC: 3.47 MIL/uL — ABNORMAL LOW (ref 4.22–5.81)
RDW: 13.7 % (ref 11.5–15.5)
WBC: 8.8 10*3/uL (ref 4.0–10.5)

## 2013-02-21 LAB — BASIC METABOLIC PANEL
Calcium: 9.1 mg/dL (ref 8.4–10.5)
Chloride: 115 mEq/L — ABNORMAL HIGH (ref 96–112)
Creatinine, Ser: 2.01 mg/dL — ABNORMAL HIGH (ref 0.50–1.35)
GFR calc Af Amer: 36 mL/min — ABNORMAL LOW (ref >90)
Glucose, Bld: 237 mg/dL — ABNORMAL HIGH (ref 70–99)
Potassium: 4.3 mEq/L (ref 3.5–5.1)
Sodium: 148 mEq/L — ABNORMAL HIGH (ref 135–145)

## 2013-02-21 LAB — GLUCOSE, CAPILLARY
Glucose-Capillary: 219 mg/dL — ABNORMAL HIGH (ref 70–99)
Glucose-Capillary: 186 mg/dL — ABNORMAL HIGH (ref 70–99)
Glucose-Capillary: 190 mg/dL — ABNORMAL HIGH (ref 70–99)
Glucose-Capillary: 202 mg/dL — ABNORMAL HIGH (ref 70–99)
Glucose-Capillary: 192 mg/dL — ABNORMAL HIGH (ref 70–99)

## 2013-02-21 LAB — MAGNESIUM: Magnesium: 2.2 mg/dL (ref 1.5–2.5)

## 2013-02-21 MED ORDER — CARVEDILOL 3.125 MG PO TABS
3.1250 mg | ORAL_TABLET | Freq: Two times a day (BID) | ORAL | Status: DC
  Administered 2013-02-21 – 2013-02-27 (×12): 3.125 mg via ORAL
  Filled 2013-02-21 (×15): qty 1

## 2013-02-21 NOTE — Progress Notes (Signed)
PULMONARY  / CRITICAL CARE MEDICINE  Name: Austin Salas MRN: [id number] DOB: 23-Jun-1940    ADMISSION DATE:  02/15/2013  REFERRING MD :  EDP PRIMARY SERVICE: PCCM   BRIEF PATIENT DESCRIPTION:  72 M s/p recent craniotomy for R sided SDH. Recovered well from that event. Admitted with large L sided SDH, severe cerebral edema, coma.   SIGNIFICANT EVENTS / STUDIES:  11/24 CT head: Large acute left subdural hematoma with maximal diameter of 2.3 cm, causing 1.5 cm left-to-right midline shift, right lateral ventricular enlargement and effacement of the basilar cisterns. 11/24 NS consult Wynetta Emery): very grave prognosis with no chance of survival and patient is 72 years old having his second subdural in 4 months and this one being a large acute subdural with minimal neurologic exam.  Recommended supportive care only and DNR 11/24 Made DNR after PCCM discussion with wife  LINES / TUBES: ETT 11/24 >> 11/28 Trach (DF) 11/28>>>  CULTURES: None  ANTIBIOTICS: None  SUBJECTIVE: Febrile overnight.  VITAL SIGNS: Temp:  [99.7 F (37.6 C)-101.7 F (38.7 C)] 99.9 F (37.7 C) (11/30 1000) Pulse Rate:  [62-132] 74 (11/30 1000) Resp:  [16-29] 23 (11/30 1000) BP: (87-214)/(37-84) 139/44 mmHg (11/30 1000) SpO2:  [93 %-100 %] 99 % (11/30 1000) FiO2 (%):  [30 %] 30 % (11/30 1000) Weight:  [116.1 kg (255 lb 15.3 oz)] 116.1 kg (255 lb 15.3 oz) (11/30 0500) HEMODYNAMICS:   VENTILATOR SETTINGS: Vent Mode:  [-] PSV;CPAP FiO2 (%):  [30 %] 30 % Set Rate:  [20 bmp] 20 bmp Vt Set:  [600 mL] 600 mL PEEP:  [5 cmH20] 5 cmH20 Pressure Support:  [8 cmH20-10 cmH20] 10 cmH20 Plateau Pressure:  [21 cmH20] 21 cmH20 INTAKE / OUTPUT: Intake/Output     11/29 0701 - 11/30 0700 11/30 0701 - 12/01 0700   I.V. (mL/kg) 658 (5.7) 81 (0.7)   NG/GT 1350 150   Total Intake(mL/kg) 2008 (17.3) 231 (2)   Urine (mL/kg/hr) 2495 (0.9) 155 (0.3)   Total Output 2495 155   Net -487 +76         PHYSICAL  EXAMINATION: General:  Chronically ill, trached Neuro: Unresponsive this AM (some sedation for agitation overnight). HEENT: Cranial defect on R side from prior surgery noted. No evidence of trauma.  Cardiovascular: RRR s M Lungs: scattered rhonchi Abdomen: obese, soft, NT, +BS Ext: warm, no edema  LABS:  CBC  CBC    Component Value Date/Time   WBC 8.8 02/21/2013 0455   RBC 3.47* 02/21/2013 0455   HGB 10.2* 02/21/2013 0455   HCT 31.3* 02/21/2013 0455   PLT 188 02/21/2013 0455   MCV 90.2 02/21/2013 0455   MCH 29.4 02/21/2013 0455   MCHC 32.6 02/21/2013 0455   RDW 13.7 02/21/2013 0455   LYMPHSABS 2.0 02/15/2013 1740   MONOABS 1.2* 02/15/2013 1740   EOSABS 0.2 02/15/2013 1740   BASOSABS 0.0 02/15/2013 1740     BMET    Component Value Date/Time   NA 148* 02/21/2013 0455   K 4.3 02/21/2013 0455   CL 115* 02/21/2013 0455   CO2 20 02/21/2013 0455   GLUCOSE 237* 02/21/2013 0455   BUN 79* 02/21/2013 0455   CREATININE 2.01* 02/21/2013 0455   CALCIUM 9.1 02/21/2013 0455   GFRNONAA 31* 02/21/2013 0455   GFRAA 36* 02/21/2013 0455   CXR: 11/25 rotated film, wide mediastinum  ASSESSMENT / PLAN:  PULMONARY A:  Acute resp failure due to neurological catastophe.  Trached 11/28. P:   - Failed  PS trials yesterday, will attempt again today. - BD as ordered. - Titrate O2 for sat.  CARDIOVASCULAR A:  Hypertension P:  - Continue amlodipine 10, hold ACE/ ARB for now. - Add coreg give SBP of 214 overnight, will give low dose. - PRN hydralazine to maintain SBP < 170 mmHg. - PRN metoprolol to maintain HR < 110/min.  RENAL A:   CKD, stage III.  Hypernatremic. P:   - Hold ACE/ ARB for now - Maintenance IVFs ordered. - Monitor BMET intermittently. - Correct electrolytes as indicated.  GASTROINTESTINAL A:   No issues P:   - SUP: IV PPI. - Continue TF. - PEG in AM (contrast being given now).  HEMATOLOGIC A:   Mild anemia without evidence of acute blood loss P:  -  Monitor CBC intermittently.  ENDOCRINE A:   DM II P:   - SSI ordered. - CBGs as ordered.  NEUROLOGIC A:   Massive SDH, inoperable Cerebral edema with L>R midline shift Coma -resolved P:   - PRN fentanyl. - Treat fever.  TODAY'S SUMMARY:  Continue PS as tolerated, no TC for now given last nights events.  Will need peg prior to discharge and getting contrast today for preparation for PEG in AM.  I have personally obtained a history, examined the patient, evaluated laboratory and imaging results, formulated the assessment and plan and placed orders.  CRITICAL CARE: The patient is critically ill with multiple organ systems failure and requires high complexity decision making for assessment and support, frequent evaluation and titration of therapies, application of advanced monitoring technologies and extensive interpretation of multiple databases. Critical Care Time devoted to patient care services described in this note is 35 minutes.   Alyson Reedy, M.D. [facility] Pulmonary/Critical Care Medicine. Pager: [phone]. After hours pager: [phone].

## 2013-02-21 NOTE — Progress Notes (Addendum)
Contacted wife Alice by telephone and discussed gastrostomy tube placement. She seemed reluctant to move forward as she states she's not sure if this is what he would have wanted, but they apparently never really discussed these issues. As such, she is agreeable to move forward. I explained the procedure and technique of IR placed g-tube. Risks, complications, and care expectations discussed. Chart reviewed, no changes from yesterday. Still spiking some fevers, likely neurogenic. Will plan for procedure tomorrow. Barium via NGT today, hopefully this will move along to lower GI tract by tomorrow am, though I am a little worried as he has not had any recent BMs.  RN reports passing flatus but has been getting TF for past few days but no stool output. Wife will sign consent when she arrives today.  Brayton El PA-C Interventional Radiology 02/21/2013 9:28 AM

## 2013-02-21 NOTE — Progress Notes (Signed)
Patient ID: Austin Salas, male   DOB: [birth year], 72 y.o.   MRN: [id number] Opens eyes, follows simple commands with the right side. Mouth some words. Plegic on left side. Condition appears stable.

## 2013-02-22 ENCOUNTER — Inpatient Hospital Stay (HOSPITAL_COMMUNITY): Payer: BC Managed Care – PPO

## 2013-02-22 LAB — GLUCOSE, CAPILLARY
Glucose-Capillary: 144 mg/dL — ABNORMAL HIGH (ref 70–99)
Glucose-Capillary: 157 mg/dL — ABNORMAL HIGH (ref 70–99)
Glucose-Capillary: 178 mg/dL — ABNORMAL HIGH (ref 70–99)
Glucose-Capillary: 185 mg/dL — ABNORMAL HIGH (ref 70–99)
Glucose-Capillary: 252 mg/dL — ABNORMAL HIGH (ref 70–99)

## 2013-02-22 LAB — BASIC METABOLIC PANEL
CO2: 21 mEq/L (ref 19–32)
Chloride: 117 mEq/L — ABNORMAL HIGH (ref 96–112)
Creatinine, Ser: 2.01 mg/dL — ABNORMAL HIGH (ref 0.50–1.35)
GFR calc Af Amer: 36 mL/min — ABNORMAL LOW (ref >90)
GFR calc non Af Amer: 31 mL/min — ABNORMAL LOW (ref >90)
Sodium: 152 mEq/L — ABNORMAL HIGH (ref 135–145)

## 2013-02-22 LAB — CBC
MCV: 90.9 fL (ref 78.0–100.0)
Platelets: 216 10*3/uL (ref 150–400)
RDW: 13.7 % (ref 11.5–15.5)
WBC: 9.8 10*3/uL (ref 4.0–10.5)

## 2013-02-22 LAB — MAGNESIUM: Magnesium: 2.2 mg/dL (ref 1.5–2.5)

## 2013-02-22 MED ORDER — GLUCAGON HCL (RDNA) 1 MG IJ SOLR
INTRAMUSCULAR | Status: AC
  Administered 2013-02-22: 1 mg
  Filled 2013-02-22: qty 1

## 2013-02-22 MED ORDER — CEFAZOLIN SODIUM 1-5 GM-% IV SOLN
INTRAVENOUS | Status: AC
  Administered 2013-02-22: 2 g
  Filled 2013-02-22: qty 100

## 2013-02-22 MED ORDER — IOHEXOL 300 MG/ML  SOLN
50.0000 mL | Freq: Once | INTRAMUSCULAR | Status: AC | PRN
  Administered 2013-02-22: 25 mL

## 2013-02-22 MED ORDER — DEXTROSE 5 % IV SOLN
INTRAVENOUS | Status: AC
  Administered 2013-02-22: 10:00:00 via INTRAVENOUS
  Administered 2013-02-23: 1000 mL via INTRAVENOUS

## 2013-02-22 MED ORDER — MIDAZOLAM HCL 2 MG/2ML IJ SOLN
INTRAMUSCULAR | Status: AC
  Filled 2013-02-22: qty 2

## 2013-02-22 NOTE — ED Notes (Addendum)
Pt on fentanyl gtt. @ 70 mcg/hr. No other meds used

## 2013-02-22 NOTE — Plan of Care (Signed)
Problem: Acute Treatment Outcomes Goal: Pain controlled Remains on fentanyl gtt @ 70 mcgs/hr. No boluses administered this shift.

## 2013-02-22 NOTE — Progress Notes (Signed)
RT Note: Pt transported to IR with no complications, RT will continue to monitor.

## 2013-02-22 NOTE — Progress Notes (Signed)
Dr. Wynetta Emery updated by night shift RN. Orders received and carried out. Continuing to monitor.

## 2013-02-22 NOTE — Progress Notes (Signed)
Austin Salas (Student PA) made aware of [name] this morning. Aware [name]. Also made aware patient failed vent wean yesterday per nursing report. Celine Salas to make Dr. Molli Knock aware for orders. Continuing to monitor closely.

## 2013-02-22 NOTE — Procedures (Signed)
Interventional Radiology Procedure Note  Procedure: Placement of percutaneous 23F pull-through gastrostomy tube. Complications: None Recommendations: - NPO except for sips and chips remainder of today and overnight - Maintain G-tube to LWS until tomorrow at 15:00 - May advance diet as tolerated and begin using tube tomorrow (02/23/13) at 15:00  Signed,  Sterling Big, MD [facility] Radiology

## 2013-02-22 NOTE — Progress Notes (Signed)
Subjective: Patient reports Remains intubated   Objective: Vital signs in last 24 hours: Temp:  [98.8 F (37.1 C)-101.3 F (38.5 C)] 100 F (37.8 C) (12/01 1700) Pulse Rate:  [60-118] 89 (12/01 1700) Resp:  [16-29] 21 (12/01 1700) BP: (101-180)/(36-92) 146/71 mmHg (12/01 1700) SpO2:  [94 %-100 %] 99 % (12/01 1700) FiO2 (%):  [30 %-100 %] 100 % (12/01 1507) Weight:  [113.6 kg (250 lb 7.1 oz)] 113.6 kg (250 lb 7.1 oz) (12/01 0800)  Intake/Output from previous day: 11/30 0701 - 12/01 0700 In: 1428 [I.V.:628; NG/GT:800] Out: 2570 [Urine:2570] Intake/Output this shift: Total I/O In: 614.8 [I.V.:564.8; NG/GT:50] Out: 1250 [Urine:1250]  Open his eyes to voice and follows commands on the right dense left hip region pupils remained nonreactive  Lab Results:  Recent Labs  02/21/13 0455 02/22/13 0410  WBC 8.8 9.8  HGB 10.2* 10.6*  HCT 31.3* 32.9*  PLT 188 216   BMET  Recent Labs  02/21/13 0455 02/22/13 0410  NA 148* 152*  K 4.3 4.3  CL 115* 117*  CO2 20 21  GLUCOSE 237* 172*  BUN 79* 84*  CREATININE 2.01* 2.01*  CALCIUM 9.1 9.6    Studies/Results: Ct Head Wo Contrast  02/22/2013   CLINICAL DATA:  Subdural hematoma.  EXAM: CT HEAD WITHOUT CONTRAST  TECHNIQUE: Contiguous axial images were obtained from the base of the skull through the vertex without intravenous contrast.  COMPARISON:  CT head without contrast 02/15/2013  FINDINGS: A mixed density extra-axial collection over the left convexity is slightly decreased in size since the prior exam. There is no new hemorrhage. Midline shift at the foramen of Monro has slightly decreased from 12 mm to the 9.5 mm. There is slightly less effacement of the left lateral ventricle. Diffuse sulcal effacement is present. Flattening of the the basal cisterns is slightly improved.  No acute infarct or parenchymal hemorrhage is present.  An NG tube is in place. Atherosclerotic calcifications are present at the cavernous carotid arteries.  The paranasal sinuses and mastoid air cells are a clear. A previous right parietal craniectomy is again noted. No acute fracture is evident.  IMPRESSION: 1. Slight decrease in size of a large mixed density subdural hematoma. 2. Slight decrease in mass effect with significant residual midline shift and sulcal effacement.   Electronically Signed   By: Gennette Pac M.D.   On: 02/22/2013 14:17   Ir Gastrostomy Tube Mod Sed  02/22/2013   CLINICAL DATA:  72 year old male with left subdural hemorrhage and dysphagia with aspiration risk. Percutaneous gastrostomy tube is required for enteral nutrition.  EXAM: PERC PLACEMENT GASTROSTOMY  Date: 02/22/2013  MEDICATIONS: 2 g Ancef was administered intravenously within 1 hour of skin incision.  TECHNIQUE: Informed consent was obtained from the patient following explanation of the procedure, risks, benefits and alternatives. The patient understands, agrees and consents for the procedure. All questions were addressed. A time out was performed.  Maximal barrier sterile technique utilized including caps, mask, sterile gowns, sterile gloves, large sterile drape, hand hygiene, and chlorhexadine skin prep.  An angled catheter was advanced over a wire under fluoroscopic guidance through the nose, down the esophagus and into the body of the stomach. The stomach was then insufflated with several 100 ml of air. Fluoroscopy confirmed location of the gastric bubble, as well as inferior displacement of the barium stained colon. Under direct fluoroscopic guidance, a single T-tack was placed, and the anterior gastric wall drawn up against the anterior abdominal wall. Percutaneous access was  then obtained into the mid gastric body with an 18 gauge sheath needle. Aspiration of air, and injection of contrast material under fluoroscopy confirmed needle placement.  An Amplatz wire was advanced in the gastric body and the access needle exchanged for a 9-French vascular sheath. A snare device was  advanced through the vascular sheath and an Amplatz wire advanced through the angled catheter. The Amplatz wire was successfully snared and this was pulled up through the esophagus and out the mouth. A 20-French Burnell Blanks MIC-PEG tube was then connected to the snare and pulled through the mouth, down the esophagus, into the stomach and out to the anterior abdominal wall. Hand injection of contrast material confirmed intragastric location. The T-tack retention suture was then cut. The pull through peg tube was then secured with the external bumper and capped.  The patient will be observed for several hours with the newly placed tube on low wall suction to evaluate for any post procedure complication. The patient tolerated the procedure well, there is no immediate complication.  ANESTHESIA/SEDATION: None  CONTRAST:  25mL OMNIPAQUE IOHEXOL 300 MG/ML  SOLN  FLUOROSCOPY TIME:  2 min 48 seconds  PROCEDURE: 1. Fluoroscopically guided placement of percutaneous pull-through gastrostomy tube. Interventional Radiologist:  Sterling Big, MD  IMPRESSION: Successful placement of a 20 French pull through gastrostomy tube.  The tube will be ready for use in 24 hr at approximately 15:00 on 02/23/2013.  Signed,  Sterling Big, MD  [facility]  [facility] Radiology   Electronically Signed   By: Malachy Moan M.D.   On: 02/22/2013 17:31   Dg Abd Portable 1v  02/22/2013   CLINICAL DATA:  Evaluate barium distribution prior to percutaneous gastrostomy tube placement  EXAM: PORTABLE ABDOMEN - 1 VIEW  COMPARISON:  Prior abdominal radiograph 02/19/2013  FINDINGS: Oral contrast material is noted throughout the colon to the splenic flexure. The bowel gas pattern is not obstructed. Enteric feeding tube is present. The weighted tip projects over the gastric body. No large free air on the supine series. No acute osseous abnormality.  IMPRESSION: Good distribution of oral contrast  material throughout the colon to the level of the splenic flexure.   Electronically Signed   By: Malachy Moan M.D.   On: 02/22/2013 07:52    Assessment/Plan: Reevaluation shows significant clinical improvement the patient's exam. Followup CT scan shows improvement in the subdural fluid collection with some breakdown the acute acute blood now mixed density subacute to acute. Due to significant proven patient's clinical exam I recommended that we proceed forward tomorrow with craniotomy for evacuation of his left acute on subacute subdural hematoma. I have extensively gone over the risks benefits of this operation with the family and they understand and agree to proceed forward.  LOS: 7 days     Almetta Liddicoat P 02/22/2013, 5:46 PM

## 2013-02-22 NOTE — Progress Notes (Signed)
RT Note: Pt transported to CT with no complications, RT will continue to monitor. 

## 2013-02-22 NOTE — Plan of Care (Signed)
Problem: Consults Goal: Intracranial Hemorrhage Patient Education See Patient Education Module for education specifics.  Patient went for CT of head without contrast today per MD order. Dr. Wynetta Emery to update patient family this evening.

## 2013-02-22 NOTE — Progress Notes (Signed)
PULMONARY  / CRITICAL CARE MEDICINE  Name: Austin Salas MRN: [id number] DOB: Oct 18, 1940    ADMISSION DATE:  02/15/2013  REFERRING MD :  EDP PRIMARY SERVICE: PCCM   BRIEF PATIENT DESCRIPTION:  72 M s/p recent craniotomy for R sided SDH. Recovered well from that event. Admitted with large L sided SDH, severe cerebral edema, coma.   SIGNIFICANT EVENTS / STUDIES:  11/24 CT head: Large acute left subdural hematoma with maximal diameter of 2.3 cm, causing 1.5 cm left-to-right midline shift, right lateral ventricular enlargement and effacement of the basilar cisterns. 11/24 NS consult Wynetta Emery): very grave prognosis with no chance of survival and patient is 72 years old having his second subdural in 4 months and this one being a large acute subdural with minimal neurologic exam.  Recommended supportive care only and DNR 11/24 Made DNR after PCCM discussion with wife  LINES / TUBES: ETT 11/24 >> 11/28 Trach (DF) 11/28>>> Foley 11/24 >>> NGT 11/29 >>>  CULTURES: None  ANTIBIOTICS: None  SUBJECTIVE: Vitals stable, mildly hypertensive.  Afebrile.  Requiring fentanyl for agitation. Per RN, no acute events overnight.  VITAL SIGNS: Temp:  [99.7 F (37.6 C)-101.8 F (38.8 C)] 99.7 F (37.6 C) (12/01 0500) Pulse Rate:  [61-114] 105 (12/01 0500) Resp:  [20-27] 22 (12/01 0500) BP: (101-195)/(36-92) 176/60 mmHg (12/01 0500) SpO2:  [97 %-100 %] 97 % (12/01 0500) FiO2 (%):  [30 %] 30 % (12/01 0439) HEMODYNAMICS:   VENTILATOR SETTINGS: Vent Mode:  [-] PRVC FiO2 (%):  [30 %] 30 % Set Rate:  [20 bmp] 20 bmp Vt Set:  [600 mL] 600 mL PEEP:  [5 cmH20] 5 cmH20 Pressure Support:  [10 cmH20] 10 cmH20 INTAKE / OUTPUT: Intake/Output     11/30 0701 - 12/01 0700 12/01 0701 - 12/02 0700   I.V. (mL/kg) 594 (5.1)    NG/GT 800    Total Intake(mL/kg) 1394 (12)    Urine (mL/kg/hr) 2570 (0.9)    Total Output 2570     Net -1176           PHYSICAL EXAMINATION: General:  Chronically ill  appearing, trached Neuro: Responds to painful stimuli. HEENT: Cranial defect on R side from prior surgery noted. No evidence of trauma.  Cardiovascular: RRR s M Lungs: scattered rhonchi Abdomen: obese, soft, NT, +BS Ext: warm, no edema  LABS:  CBC  CBC    Component Value Date/Time   WBC 9.8 02/22/2013 0410   RBC 3.62* 02/22/2013 0410   HGB 10.6* 02/22/2013 0410   HCT 32.9* 02/22/2013 0410   PLT 216 02/22/2013 0410   MCV 90.9 02/22/2013 0410   MCH 29.3 02/22/2013 0410   MCHC 32.2 02/22/2013 0410   RDW 13.7 02/22/2013 0410   LYMPHSABS 2.0 02/15/2013 1740   MONOABS 1.2* 02/15/2013 1740   EOSABS 0.2 02/15/2013 1740   BASOSABS 0.0 02/15/2013 1740     BMET    Component Value Date/Time   NA 152* 02/22/2013 0410   K 4.3 02/22/2013 0410   CL 117* 02/22/2013 0410   CO2 21 02/22/2013 0410   GLUCOSE 172* 02/22/2013 0410   BUN 84* 02/22/2013 0410   CREATININE 2.01* 02/22/2013 0410   CALCIUM 9.6 02/22/2013 0410   GFRNONAA 31* 02/22/2013 0410   GFRAA 36* 02/22/2013 0410   CXR: 11/25 rotated film, wide mediastinum KUB 12/1:  Good distribution of oral contrast material throughout the colon to the level of the splenic flexure.   ASSESSMENT / PLAN:  PULMONARY A:  Acute resp  failure due to neurological catastophe.  Trached 11/28. P:   - Failed PS trials 11/30, will attempt again today. - BD as ordered. - Titrate O2 for sat.  CARDIOVASCULAR A:  Hypertension P:  - Continue amlodipine 10, hold ACE/ ARB for now. - Cont coreg give SBP of 214 overnight, will give low dose. - PRN hydralazine to maintain SBP < 170 mmHg. - PRN metoprolol to maintain HR < 110/min.  RENAL A:   CKD, stage III. Hypernatremic. P:   - Hold ACE/ ARB for now - Monitor BMET intermittently. - Correct electrolytes as indicated. - D5W for hypernatremia.  GASTROINTESTINAL A:   No issues P:   - SUP: IV PPI. - Continue TF. - PEG today per IR. - NPO until after PEG.  HEMATOLOGIC A:   Mild anemia without  evidence of acute blood loss P:  - Monitor CBC intermittently.  ENDOCRINE A:   DM II P:   - SSI ordered. - CBGs as ordered.  NEUROLOGIC A:   Massive SDH, inoperable Cerebral edema with L>R midline shift Coma -resolved P:   - PRN fentanyl.  Rutherford Guys, PA - S  D5W for hypernatremia, BMET in AM.  Maintain PS as tolerated, will attempt to get to TC in AM.  CC time 35 min.  Patient seen and examined, agree with above note.  I dictated the care and orders written for this patient under my direction.  Alyson Reedy, MD [phone]

## 2013-02-22 NOTE — Procedures (Signed)
Name:  SAVAS ELVIN MRN:  [id number] DOB:  10/01/1940  OPERATIVE NOTE  Procedure:  Percutaneous tracheostomy.  Indications:  Ventilator-dependent respiratory failure.  Consent:  Procedure, alternatives, risks and benefits discussed with medical POA.  Questions answered.  Consent obtained.  Anesthesia:  Versed, fent, propofol  Procedure summary:  Appropriate equipment was assembled.  The patient was identified as Austin Salas and safety timeout was performed. The patient was placed in supine position with a towel roll behind shoulder blades and neck extended.  Sterile technique was used. The patient's neck and upper chest were prepped using chlorhexidine / alcohol scrub and the field was draped in usual sterile fashion with full body drape. After the adequate sedation / anesthesia was achieved, attention was directed at the midline trachea, where the cricothyroid membrane was palpated. Approximately two fingerbreadths above the sternal notch, a verticle incision was created with a scalpel after local infiltration with 0.2% Lidocaine. Dissection was made down to tracheal rings, sepeerated strap muscles.  NOted thyroid tissue superior, mild oozing, required 3 sutures and some surgisel was packed with good hemostasis. Then, using Seldinger technique and a percutaneous tracheostomy set, the trachea was entered with a 14 gauge needle with an overlying sheath. This was all confirmed under direct visualization of a fiberoptic flexible bronchoscope. Entrance into the trachea was identified through the third tracheal ring interspace. Following this, a guidewire was inserted. The needle was removed, leaving the sheath and the guidewire intact. Next, the sheath was removed and a small 14 french dilator was inserted. The tracheal rings were then dilated with progressive rhino diltor. 6 Shiley was then opened. The balloon was checked. It was placed over a tracheal dilator, which was then advanced over the  guidewire and through the previously dilated tract. The Shiley tracheostomy tube was noted to pass in the trachea with little resistance. The guidewire and dilator tubes were removed from the trachea. An inner cannula was placed through the tracheostomy tube. The tracheostomy was then secured at the anterior neck with 4 monofilament sutures. The oral endotracheal tube was removed and the ventilator was attached to the newly placed tracheostomy tube. Adequate tidal volumes were noted. The cuff was inflated and no evidence of air leak was noted. No evidence of bleeding was noted. At this point, the procedure was concluded. Post-procedure chest x-ray was ordered.  Complications:  No immediate complications were noted.  Hemodynamic parameters and oxygenation remained stable throughout the procedure.  Estimated blood loss:  50 ml  This pt can follow up at our trach clinic [phone]  Nelda Bucks., MD Pulmonary and Critical Care Medicine [facility] Pager: [phone]  02/22/2013, 7:58 AM

## 2013-02-23 ENCOUNTER — Encounter (HOSPITAL_COMMUNITY)
Admission: EM | Disposition: A | Discharge status: Skilled Nursing Facility | Payer: Self-pay | Source: Home / Self Care | Attending: Pulmonary Disease

## 2013-02-23 ENCOUNTER — Inpatient Hospital Stay (HOSPITAL_COMMUNITY): Payer: BC Managed Care – PPO

## 2013-02-23 ENCOUNTER — Encounter (HOSPITAL_COMMUNITY): Payer: BC Managed Care – PPO | Admitting: Certified Registered"

## 2013-02-23 ENCOUNTER — Other Ambulatory Visit: Payer: Self-pay | Admitting: Neurosurgery

## 2013-02-23 ENCOUNTER — Inpatient Hospital Stay (HOSPITAL_COMMUNITY): Payer: BC Managed Care – PPO | Admitting: Certified Registered"

## 2013-02-23 HISTORY — PX: CRANIOTOMY: SHX93

## 2013-02-23 LAB — GLUCOSE, CAPILLARY
Glucose-Capillary: 225 mg/dL — ABNORMAL HIGH (ref 70–99)
Glucose-Capillary: 194 mg/dL — ABNORMAL HIGH (ref 70–99)
Glucose-Capillary: 185 mg/dL — ABNORMAL HIGH (ref 70–99)
Glucose-Capillary: 177 mg/dL — ABNORMAL HIGH (ref 70–99)

## 2013-02-23 LAB — PHOSPHORUS: Phosphorus: 4 mg/dL (ref 2.3–4.6)

## 2013-02-23 LAB — BASIC METABOLIC PANEL
BUN: 81 mg/dL — ABNORMAL HIGH (ref 6–23)
BUN: 77 mg/dL — ABNORMAL HIGH (ref 6–23)
CO2: 23 mEq/L (ref 19–32)
Calcium: 9.5 mg/dL (ref 8.4–10.5)
Calcium: 9.4 mg/dL (ref 8.4–10.5)
Chloride: 118 mEq/L — ABNORMAL HIGH (ref 96–112)
Chloride: 114 mEq/L — ABNORMAL HIGH (ref 96–112)
Creatinine, Ser: 1.89 mg/dL — ABNORMAL HIGH (ref 0.50–1.35)
GFR calc Af Amer: 39 mL/min — ABNORMAL LOW (ref >90)
GFR calc non Af Amer: 34 mL/min — ABNORMAL LOW (ref >90)
GFR calc non Af Amer: 35 mL/min — ABNORMAL LOW (ref >90)
Glucose, Bld: 267 mg/dL — ABNORMAL HIGH (ref 70–99)
Sodium: 149 mEq/L — ABNORMAL HIGH (ref 135–145)

## 2013-02-23 LAB — CBC
HCT: 31.8 % — ABNORMAL LOW (ref 39.0–52.0)
HCT: 32.5 % — ABNORMAL LOW (ref 39.0–52.0)
Hemoglobin: 10.4 g/dL — ABNORMAL LOW (ref 13.0–17.0)
Hemoglobin: 10.5 g/dL — ABNORMAL LOW (ref 13.0–17.0)
MCH: 30.1 pg (ref 26.0–34.0)
MCH: 30 pg (ref 26.0–34.0)
MCHC: 32.7 g/dL (ref 30.0–36.0)
MCHC: 32.3 g/dL (ref 30.0–36.0)
MCV: 91.9 fL (ref 78.0–100.0)
MCV: 92.9 fL (ref 78.0–100.0)
RBC: 3.5 MIL/uL — ABNORMAL LOW (ref 4.22–5.81)
RDW: 13.9 % (ref 11.5–15.5)
WBC: 9.7 10*3/uL (ref 4.0–10.5)

## 2013-02-23 LAB — OSMOLALITY, URINE: Osmolality, Ur: 622 mOsm/kg (ref 390–1090)

## 2013-02-23 SURGERY — CRANIOTOMY HEMATOMA EVACUATION SUBDURAL
Anesthesia: General | Site: Head | Laterality: Left

## 2013-02-23 MED ORDER — ACETAMINOPHEN 650 MG RE SUPP
650.0000 mg | Freq: Four times a day (QID) | RECTAL | Status: DC | PRN
  Administered 2013-02-23: 650 mg via RECTAL
  Filled 2013-02-23: qty 1

## 2013-02-23 MED ORDER — CEFAZOLIN SODIUM 1-5 GM-% IV SOLN
INTRAVENOUS | Status: AC
  Filled 2013-02-23: qty 50

## 2013-02-23 MED ORDER — SODIUM CHLORIDE 0.9 % IR SOLN
Status: DC | PRN
  Administered 2013-02-23: 18:00:00

## 2013-02-23 MED ORDER — DEXTROSE 5 % IV SOLN
INTRAVENOUS | Status: AC
  Administered 2013-02-23: 15:00:00 via INTRAVENOUS
  Administered 2013-02-24: 1000 mL via INTRAVENOUS

## 2013-02-23 MED ORDER — DEXTROSE 5 % IV SOLN
INTRAVENOUS | Status: DC | PRN
  Administered 2013-02-23: 17:00:00 via INTRAVENOUS

## 2013-02-23 MED ORDER — CEFAZOLIN SODIUM 1-5 GM-% IV SOLN
INTRAVENOUS | Status: AC
  Administered 2013-02-23: 2 g via INTRAVENOUS
  Filled 2013-02-23: qty 50

## 2013-02-23 MED ORDER — LIDOCAINE-EPINEPHRINE 1 %-1:100000 IJ SOLN
INTRAMUSCULAR | Status: DC | PRN
  Administered 2013-02-23: 5 mL

## 2013-02-23 MED ORDER — MIDAZOLAM HCL 5 MG/5ML IJ SOLN
INTRAMUSCULAR | Status: DC | PRN
  Administered 2013-02-23: 4 mg via INTRAVENOUS

## 2013-02-23 MED ORDER — THROMBIN 20000 UNITS EX SOLR
CUTANEOUS | Status: DC | PRN
  Administered 2013-02-23: 18:00:00 via TOPICAL

## 2013-02-23 MED ORDER — 0.9 % SODIUM CHLORIDE (POUR BTL) OPTIME
TOPICAL | Status: DC | PRN
  Administered 2013-02-23 (×4): 1000 mL

## 2013-02-23 MED ORDER — ROCURONIUM BROMIDE 100 MG/10ML IV SOLN
INTRAVENOUS | Status: DC | PRN
  Administered 2013-02-23: 20 mg via INTRAVENOUS
  Administered 2013-02-23: 30 mg via INTRAVENOUS
  Administered 2013-02-23: 20 mg via INTRAVENOUS

## 2013-02-23 MED ORDER — PROPOFOL 10 MG/ML IV BOLUS
INTRAVENOUS | Status: DC | PRN
  Administered 2013-02-23 (×2): 50 mg via INTRAVENOUS

## 2013-02-23 MED ORDER — PHENYLEPHRINE HCL 10 MG/ML IJ SOLN
INTRAMUSCULAR | Status: DC | PRN
  Administered 2013-02-23 (×2): 80 ug via INTRAVENOUS
  Administered 2013-02-23 (×2): 40 ug via INTRAVENOUS

## 2013-02-23 MED ORDER — FENTANYL CITRATE 0.05 MG/ML IJ SOLN
INTRAMUSCULAR | Status: DC | PRN
  Administered 2013-02-23 (×2): 50 ug via INTRAVENOUS
  Administered 2013-02-23: 100 ug via INTRAVENOUS
  Administered 2013-02-23: 50 ug via INTRAVENOUS

## 2013-02-23 MED ORDER — MICROFIBRILLAR COLL HEMOSTAT EX PADS
MEDICATED_PAD | CUTANEOUS | Status: DC | PRN
  Administered 2013-02-23: 1 via TOPICAL

## 2013-02-23 MED ORDER — LACTATED RINGERS IV SOLN
INTRAVENOUS | Status: DC | PRN
  Administered 2013-02-23: 17:00:00 via INTRAVENOUS

## 2013-02-23 MED ORDER — HEMOSTATIC AGENTS (NO CHARGE) OPTIME
TOPICAL | Status: DC | PRN
  Administered 2013-02-23: 1 via TOPICAL

## 2013-02-23 SURGICAL SUPPLY — 76 items
BAG DECANTER FOR FLEXI CONT (MISCELLANEOUS) ×2 IMPLANT
BANDAGE GAUZE 4  KLING STR (GAUZE/BANDAGES/DRESSINGS) IMPLANT
BIT DRILL WIRE PASS 1.3MM (BIT) ×1 IMPLANT
BLADE SURG 11 STRL SS (BLADE) ×2 IMPLANT
BLADE SURG ROTATE 9660 (MISCELLANEOUS) ×2 IMPLANT
BNDG COHESIVE 4X5 TAN NS LF (GAUZE/BANDAGES/DRESSINGS) IMPLANT
BRUSH SCRUB EZ PLAIN DRY (MISCELLANEOUS) ×2 IMPLANT
BUR ACORN 9.0 PRECISION (BURR) ×2 IMPLANT
BUR ROUTER D-58 CRANI (BURR) ×2 IMPLANT
CANISTER SUCT 3000ML (MISCELLANEOUS) ×4 IMPLANT
CATH ROBINSON RED A/P 10FR (CATHETERS) ×2 IMPLANT
CLIP TI MEDIUM 6 (CLIP) IMPLANT
CONT SPEC 4OZ CLIKSEAL STRL BL (MISCELLANEOUS) ×2 IMPLANT
CORDS BIPOLAR (ELECTRODE) ×2 IMPLANT
COVER TABLE BACK 60X90 (DRAPES) IMPLANT
DECANTER SPIKE VIAL GLASS SM (MISCELLANEOUS) ×2 IMPLANT
DERMABOND ADVANCED (GAUZE/BANDAGES/DRESSINGS)
DERMABOND ADVANCED .7 DNX12 (GAUZE/BANDAGES/DRESSINGS) IMPLANT
DRAIN SNY WOU 7FLT (WOUND CARE) ×2 IMPLANT
DRAPE NEUROLOGICAL W/INCISE (DRAPES) ×2 IMPLANT
DRAPE SURG 17X23 STRL (DRAPES) IMPLANT
DRAPE WARM FLUID 44X44 (DRAPE) ×2 IMPLANT
DRILL WIRE PASS 1.3MM (BIT) ×2
DRSG OPSITE 4X5.5 SM (GAUZE/BANDAGES/DRESSINGS) ×2 IMPLANT
ELECT CAUTERY BLADE 6.4 (BLADE) IMPLANT
ELECT REM PT RETURN 9FT ADLT (ELECTROSURGICAL) ×2
ELECTRODE REM PT RTRN 9FT ADLT (ELECTROSURGICAL) ×1 IMPLANT
EVACUATOR SILICONE 100CC (DRAIN) ×2 IMPLANT
GAUZE SPONGE 4X4 16PLY XRAY LF (GAUZE/BANDAGES/DRESSINGS) IMPLANT
GLOVE BIO SURGEON STRL SZ8 (GLOVE) ×4 IMPLANT
GLOVE BIOGEL PI IND STRL 7.5 (GLOVE) ×2 IMPLANT
GLOVE BIOGEL PI IND STRL 8.5 (GLOVE) ×1 IMPLANT
GLOVE BIOGEL PI INDICATOR 7.5 (GLOVE) ×2
GLOVE BIOGEL PI INDICATOR 8.5 (GLOVE) ×1
GLOVE ECLIPSE 7.5 STRL STRAW (GLOVE) ×6 IMPLANT
GLOVE EXAM NITRILE LRG STRL (GLOVE) IMPLANT
GLOVE EXAM NITRILE MD LF STRL (GLOVE) IMPLANT
GLOVE EXAM NITRILE XL STR (GLOVE) IMPLANT
GLOVE EXAM NITRILE XS STR PU (GLOVE) IMPLANT
GLOVE INDICATOR 8.5 STRL (GLOVE) ×2 IMPLANT
GOWN BRE IMP SLV AUR LG STRL (GOWN DISPOSABLE) ×2 IMPLANT
GOWN BRE IMP SLV AUR XL STRL (GOWN DISPOSABLE) ×4 IMPLANT
GOWN STRL REIN 2XL LVL4 (GOWN DISPOSABLE) IMPLANT
HEMOSTAT SURGICEL 2X14 (HEMOSTASIS) ×2 IMPLANT
HOOK DURA (MISCELLANEOUS) ×2 IMPLANT
KIT BASIN OR (CUSTOM PROCEDURE TRAY) ×2 IMPLANT
KIT ROOM TURNOVER OR (KITS) ×2 IMPLANT
NEEDLE HYPO 25X1 1.5 SAFETY (NEEDLE) ×2 IMPLANT
NS IRRIG 1000ML POUR BTL (IV SOLUTION) ×8 IMPLANT
PACK CRANIOTOMY (CUSTOM PROCEDURE TRAY) ×2 IMPLANT
PAD ARMBOARD 7.5X6 YLW CONV (MISCELLANEOUS) ×6 IMPLANT
PAD EYE OVAL STERILE LF (GAUZE/BANDAGES/DRESSINGS) IMPLANT
PATTIES SURGICAL .25X.25 (GAUZE/BANDAGES/DRESSINGS) IMPLANT
PATTIES SURGICAL .5 X.5 (GAUZE/BANDAGES/DRESSINGS) IMPLANT
PATTIES SURGICAL .5 X3 (DISPOSABLE) IMPLANT
PATTIES SURGICAL 1X1 (DISPOSABLE) IMPLANT
PIN MAYFIELD SKULL DISP (PIN) IMPLANT
PLATE 1.5  2HOLE LNG NEURO (Plate) ×2 IMPLANT
PLATE 1.5 2HOLE LNG NEURO (Plate) ×2 IMPLANT
PLATE 1.5/0.5 13MM BURR HOLE (Plate) ×2 IMPLANT
SCREW SELF DRILL HT 1.5/4MM (Screw) ×16 IMPLANT
SPONGE GAUZE 4X4 12PLY (GAUZE/BANDAGES/DRESSINGS) ×2 IMPLANT
SPONGE NEURO XRAY DETECT 1X3 (DISPOSABLE) IMPLANT
SPONGE SURGIFOAM ABS GEL 100 (HEMOSTASIS) ×2 IMPLANT
STAPLER SKIN PROX WIDE 3.9 (STAPLE) IMPLANT
STAPLER VISISTAT 35W (STAPLE) ×2 IMPLANT
SUT ETHILON 3 0 FSL (SUTURE) ×2 IMPLANT
SUT NURALON 4 0 TR CR/8 (SUTURE) ×4 IMPLANT
SUT VIC AB 2-0 CT1 18 (SUTURE) ×4 IMPLANT
SYR 20ML ECCENTRIC (SYRINGE) ×2 IMPLANT
SYR CONTROL 10ML LL (SYRINGE) ×2 IMPLANT
TOWEL OR 17X24 6PK STRL BLUE (TOWEL DISPOSABLE) ×2 IMPLANT
TOWEL OR 17X26 10 PK STRL BLUE (TOWEL DISPOSABLE) ×2 IMPLANT
TRAY FOLEY CATH 14FRSI W/METER (CATHETERS) IMPLANT
UNDERPAD 30X30 INCONTINENT (UNDERPADS AND DIAPERS) IMPLANT
WATER STERILE IRR 1000ML POUR (IV SOLUTION) ×2 IMPLANT

## 2013-02-23 NOTE — Progress Notes (Signed)
PULMONARY  / CRITICAL CARE MEDICINE  Name: ADRON GEISEL MRN: [id number] DOB: 07/31/1940    ADMISSION DATE:  02/15/2013  REFERRING MD :  EDP PRIMARY SERVICE: PCCM   BRIEF PATIENT DESCRIPTION:  72 M s/p recent craniotomy for R sided SDH. Recovered well from that event. Admitted with large L sided SDH, severe cerebral edema, coma.   SIGNIFICANT EVENTS / STUDIES:  11/24 CT head: Large acute left subdural hematoma with maximal diameter of 2.3 cm, causing 1.5 cm left-to-right midline shift, right lateral ventricular enlargement and effacement of the basilar cisterns. 11/24 NS consult Wynetta Emery): very grave prognosis with no chance of survival and patient is 72 years old having his second subdural in 4 months and this one being a large acute subdural with minimal neurologic exam.  Recommended supportive care only and DNR 11/24 Made DNR after PCCM discussion with wife. 11/28 Trach 12/1 PEG by IR.  Head CT with decrease in size of L SDH, decrease in mass effect. 12/2 Craniotomy per Dr. Wynetta Emery >>>  LINES / TUBES: ETT 11/24 >> 11/28 Trach (DF) 11/28>>> Foley 11/24 >>> NGT 11/29 >>> PEG 12/1 >>>  CULTURES: None  ANTIBIOTICS: None  SUBJECTIVE: Vitals stable.  Afebrile.  Remains on Fentanyl for agitation. Per RN, no acute events overnight.  To OR today for craniotomy by Dr. Wynetta Emery.  VITAL SIGNS: Temp:  [98.8 F (37.1 C)-101.3 F (38.5 C)] 99.1 F (37.3 C) (12/02 0900) Pulse Rate:  [58-118] 64 (12/02 0900) Resp:  [16-31] 22 (12/02 0900) BP: (105-180)/(36-74) 115/43 mmHg (12/02 0900) SpO2:  [94 %-100 %] 98 % (12/02 0900) FiO2 (%):  [30 %-100 %] 30 % (12/02 0820) Weight:  [259 lb 14.8 oz (117.9 kg)] 259 lb 14.8 oz (117.9 kg) (12/02 0500) HEMODYNAMICS:   VENTILATOR SETTINGS: Vent Mode:  [-] PSV;CPAP FiO2 (%):  [30 %-100 %] 30 % Set Rate:  [20 bmp] 20 bmp Vt Set:  [600 mL] 600 mL PEEP:  [5 cmH20] 5 cmH20 Pressure Support:  [10 cmH20] 10 cmH20 Plateau Pressure:  [14 cmH20-27  cmH20] 14 cmH20 INTAKE / OUTPUT: Intake/Output     12/01 0701 - 12/02 0700 12/02 0701 - 12/03 0700   I.V. (mL/kg) 1787.4 (15.2) 164 (1.4)   NG/GT 100 50   Total Intake(mL/kg) 1887.4 (16) 214 (1.8)   Urine (mL/kg/hr) 2540 (0.9) 325 (1.2)   Total Output 2540 325   Net -652.6 -111         PHYSICAL EXAMINATION: General:  Chronically ill appearing, trached Neuro: Responds to some voice commands, moves eyes spontaneously. HEENT: Cranial defect on R side from prior surgery noted. No evidence of trauma.  Cardiovascular: RRR, no M/R/G. Lungs: scattered rhonchi Abdomen: obese, soft, NT, +BS Ext: warm, no edema  LABS:  CBC  CBC    Component Value Date/Time   WBC 9.7 02/23/2013 0332   RBC 3.46* 02/23/2013 0332   HGB 10.4* 02/23/2013 0332   HCT 31.8* 02/23/2013 0332   PLT 230 02/23/2013 0332   MCV 91.9 02/23/2013 0332   MCH 30.1 02/23/2013 0332   MCHC 32.7 02/23/2013 0332   RDW 13.9 02/23/2013 0332   LYMPHSABS 2.0 02/15/2013 1740   MONOABS 1.2* 02/15/2013 1740   EOSABS 0.2 02/15/2013 1740   BASOSABS 0.0 02/15/2013 1740     BMET    Component Value Date/Time   NA 152* 02/23/2013 0332   K 4.3 02/23/2013 0332   CL 118* 02/23/2013 0332   CO2 22 02/23/2013 0332   GLUCOSE 212* 02/23/2013  0332   BUN 81* 02/23/2013 0332   CREATININE 1.89* 02/23/2013 0332   CALCIUM 9.5 02/23/2013 0332   GFRNONAA 34* 02/23/2013 0332   GFRAA 39* 02/23/2013 0332   CXR: 11/25 rotated film, wide mediastinum KUB 12/1:  Good distribution of oral contrast material throughout the colon to the level of the splenic flexure.   ASSESSMENT / PLAN:  PULMONARY A:  Acute resp failure due to neurological catastophe.  Trached 11/28. P:   - Tolerating PS 10/5, will place back to full support for OR today. - BD as ordered. - Titrate O2 for sat.  CARDIOVASCULAR A:  Hypertension P:  - Continue amlodipine and coreg, hold ACE/ ARB for now. - PRN hydralazine to maintain SBP < 170 mmHg. - PRN metoprolol to maintain HR <  110/min.  RENAL A:   CKD, stage III. Hypernatremic. High UOP, ?DI versus CSW.  Will check urine osm today. P:   - Hold ACE/ ARB for now - Monitor BMET intermittently. - Correct electrolytes as indicated. - Cont D5W for hypernatremia.  GASTROINTESTINAL A:   No issues. P:   - SUP: IV PPI. - NPO/Hold TF for now for OR. - May resume TF after OR.  HEMATOLOGIC A:   Mild anemia without evidence of acute blood loss. P:  - Monitor CBC intermittently.  ENDOCRINE A:   DM II P:   - SSI ordered. - CBGs as ordered.  NEUROLOGIC A:   Massive SDH. Cerebral edema with L>R midline shift - improved on CT 12/1. Coma - resolved. P:   - PRN fentanyl for agitation.  Rutherford Guys, PA - S  Back to OR today for crani, will place back on vent and will f/u post op.  Check urine osm.  CC time 35 min.  Patient seen and examined, agree with above note.  I dictated the care and orders written for this patient under my direction.  Alyson Reedy, MD [phone]

## 2013-02-23 NOTE — Progress Notes (Signed)
Trach Team  Pt not appropriate for SLP interventions. Will follow chart.   Harlon Ditty, MA CCC-SLP [phone]

## 2013-02-23 NOTE — Transfer of Care (Signed)
Immediate Anesthesia Transfer of Care Note  Patient: Austin Salas  Procedure(s) Performed: Procedure(s): CRANIOTOMY HEMATOMA EVACUATION SUBDURAL (Left)  Patient Location: PACU and ICU  Anesthesia Type:General  Level of Consciousness: Patient remains intubated per anesthesia plan  Airway & Oxygen Therapy: Patient remains intubated per anesthesia plan and Patient placed on Ventilator (see vital sign flow sheet for setting)  Post-op Assessment: Report given to PACU RN and Post -op Vital signs reviewed and stable  Post vital signs: Reviewed and stable  Complications: No apparent anesthesia complications

## 2013-02-23 NOTE — Progress Notes (Signed)
Subjective: Patient reports Remains in place unable to make it  Objective: Vital signs in last 24 hours: Temp:  [99 F (37.2 C)-101.3 F (38.5 C)] 99.1 F (37.3 C) (12/02 0900) Pulse Rate:  [58-118] 64 (12/02 0900) Resp:  [16-31] 22 (12/02 0900) BP: (105-171)/(36-74) 115/43 mmHg (12/02 0900) SpO2:  [94 %-100 %] 98 % (12/02 0900) FiO2 (%):  [30 %-100 %] 30 % (12/02 0820) Weight:  [117.9 kg (259 lb 14.8 oz)] 117.9 kg (259 lb 14.8 oz) (12/02 0500)  Intake/Output from previous day: 12/01 0701 - 12/02 0700 In: 1887.4 [I.V.:1787.4; NG/GT:100] Out: 2540 [Urine:2540] Intake/Output this shift: Total I/O In: 214 [I.V.:164; Other:50] Out: 325 [Urine:325]  Response to voice and follows commands in the right left hemiplegia  Lab Results:  Recent Labs  02/22/13 0410 02/23/13 0332  WBC 9.8 9.7  HGB 10.6* 10.4*  HCT 32.9* 31.8*  PLT 216 230   BMET  Recent Labs  02/22/13 0410 02/23/13 0332  NA 152* 152*  K 4.3 4.3  CL 117* 118*  CO2 21 22  GLUCOSE 172* 212*  BUN 84* 81*  CREATININE 2.01* 1.89*  CALCIUM 9.6 9.5    Studies/Results: Ct Head Wo Contrast  02/22/2013   CLINICAL DATA:  Subdural hematoma.  EXAM: CT HEAD WITHOUT CONTRAST  TECHNIQUE: Contiguous axial images were obtained from the base of the skull through the vertex without intravenous contrast.  COMPARISON:  CT head without contrast 02/15/2013  FINDINGS: A mixed density extra-axial collection over the left convexity is slightly decreased in size since the prior exam. There is no new hemorrhage. Midline shift at the foramen of Monro has slightly decreased from 12 mm to the 9.5 mm. There is slightly less effacement of the left lateral ventricle. Diffuse sulcal effacement is present. Flattening of the the basal cisterns is slightly improved.  No acute infarct or parenchymal hemorrhage is present.  An NG tube is in place. Atherosclerotic calcifications are present at the cavernous carotid arteries. The paranasal sinuses and  mastoid air cells are a clear. A previous right parietal craniectomy is again noted. No acute fracture is evident.  IMPRESSION: 1. Slight decrease in size of a large mixed density subdural hematoma. 2. Slight decrease in mass effect with significant residual midline shift and sulcal effacement.   Electronically Signed   By: Gennette Pac M.D.   On: 02/22/2013 14:17   Ir Gastrostomy Tube Mod Sed  02/22/2013   CLINICAL DATA:  72 year old male with left subdural hemorrhage and dysphagia with aspiration risk. Percutaneous gastrostomy tube is required for enteral nutrition.  EXAM: PERC PLACEMENT GASTROSTOMY  Date: 02/22/2013  MEDICATIONS: 2 g Ancef was administered intravenously within 1 hour of skin incision.  TECHNIQUE: Informed consent was obtained from the patient following explanation of the procedure, risks, benefits and alternatives. The patient understands, agrees and consents for the procedure. All questions were addressed. A time out was performed.  Maximal barrier sterile technique utilized including caps, mask, sterile gowns, sterile gloves, large sterile drape, hand hygiene, and chlorhexadine skin prep.  An angled catheter was advanced over a wire under fluoroscopic guidance through the nose, down the esophagus and into the body of the stomach. The stomach was then insufflated with several 100 ml of air. Fluoroscopy confirmed location of the gastric bubble, as well as inferior displacement of the barium stained colon. Under direct fluoroscopic guidance, a single T-tack was placed, and the anterior gastric wall drawn up against the anterior abdominal wall. Percutaneous access was then obtained into  the mid gastric body with an 18 gauge sheath needle. Aspiration of air, and injection of contrast material under fluoroscopy confirmed needle placement.  An Amplatz wire was advanced in the gastric body and the access needle exchanged for a 9-French vascular sheath. A snare device was advanced through the  vascular sheath and an Amplatz wire advanced through the angled catheter. The Amplatz wire was successfully snared and this was pulled up through the esophagus and out the mouth. A 20-French Burnell Blanks MIC-PEG tube was then connected to the snare and pulled through the mouth, down the esophagus, into the stomach and out to the anterior abdominal wall. Hand injection of contrast material confirmed intragastric location. The T-tack retention suture was then cut. The pull through peg tube was then secured with the external bumper and capped.  The patient will be observed for several hours with the newly placed tube on low wall suction to evaluate for any post procedure complication. The patient tolerated the procedure well, there is no immediate complication.  ANESTHESIA/SEDATION: None  CONTRAST:  25mL OMNIPAQUE IOHEXOL 300 MG/ML  SOLN  FLUOROSCOPY TIME:  2 min 48 seconds  PROCEDURE: 1. Fluoroscopically guided placement of percutaneous pull-through gastrostomy tube. Interventional Radiologist:  Sterling Big, MD  IMPRESSION: Successful placement of a 20 French pull through gastrostomy tube.  The tube will be ready for use in 24 hr at approximately 15:00 on 02/23/2013.  Signed,  Sterling Big, MD  [facility]  [facility]   Electronically Signed   By: Malachy Moan M.D.   On: 02/22/2013 17:31   Dg Abd Portable 1v  02/22/2013   CLINICAL DATA:  Evaluate barium distribution prior to percutaneous gastrostomy tube placement  EXAM: PORTABLE ABDOMEN - 1 VIEW  COMPARISON:  Prior abdominal radiograph 02/19/2013  FINDINGS: Oral contrast material is noted throughout the colon to the splenic flexure. The bowel gas pattern is not obstructed. Enteric feeding tube is present. The weighted tip projects over the gastric body. No large free air on the supine series. No acute osseous abnormality.  IMPRESSION: Good distribution of oral contrast material throughout the  colon to the level of the splenic flexure.   Electronically Signed   By: Malachy Moan M.D.   On: 02/22/2013 07:52    Assessment/Plan: OR day craniotomy for evacuation of subdural will attempt initial burr hole evacuation however may very likely have to turn is over to a full craniotomy based on the extent evacuation. Risks and benefits of the The patient's family as well as therapy course expectations of outcome alternatives of surgery. They all agree to proceed forward.  LOS: 8 days     Jonisha Kindig P 02/23/2013, 10:34 AM

## 2013-02-23 NOTE — Progress Notes (Signed)
Pt to OR with Dr. Noreene Larsson, Anesthesiologist and CRNA.

## 2013-02-23 NOTE — Progress Notes (Signed)
Inpatient Diabetes Program Recommendations  AACE/ADA: New Consensus Statement on Inpatient Glycemic Control (2013)  Target Ranges:  Prepandial:   less than 140 mg/dL      Peak postprandial:   less than 180 mg/dL (1-2 hours)      Critically ill patients:  140 - 180 mg/dL  Results for RAMELLO, CORDIAL (MRN [id number]) as of 02/23/2013 09:12  Ref. Range 02/22/2013 15:58 02/22/2013 19:55 02/22/2013 23:40 02/23/2013 04:37 02/23/2013 07:57  Glucose-Capillary Latest Range: 70-99 mg/dL 409 (H) 811 (H) 914 (H) 202 (H) 180 (H)   Inpatient Diabetes Program Recommendations Insulin - Basal: consider adding low dose basal Lantus or Levemir 10 units  HgbA1C: order to assess prehospital glucose control Thank you  Piedad Climes BSN, RN,CDE Inpatient Diabetes Coordinator [phone] (team pager)

## 2013-02-23 NOTE — Progress Notes (Signed)
S/p G-tube placement yesterday without complication. He actually opens his eyes and responds to voice commands some this morning. Neuro surgery plans noted.  BP 132/46  Pulse 58  Temp(Src) 99.1 F (37.3 C) (Core (Comment))  Resp 20  Ht 5\' 10"  (1.778 m)  Wt 259 lb 14.8 oz (117.9 kg)  BMI 37.29 kg/m2  SpO2 100% G-tube site clean, dry, no hematoma/leak  Would be ok from our standpoint to begin using G-tube for TF after OR later today. Probable best to start at lower rate and titrate up to goal.  Call IR if needed.  Brayton El PA-C Interventional Radiology 02/23/2013 8:06 AM

## 2013-02-23 NOTE — Preoperative (Signed)
Beta Blockers   Reason not to administer Beta Blockers:Not Applicable 

## 2013-02-23 NOTE — Op Note (Signed)
Preoperative diagnosis: Acute on subacute subdural hematoma  Postoperative diagnosis: Same  Procedure: Left temporal craniotomy for evacuation of acute on subacute hematoma   Surgeon: Jillyn Hidden Aceton Kinnear  Assistant: Maeola Harman  Anesthesia: Gen.  EBL: Minimal  History of present illness: Patient is a 72 year old gentleman who was admitted hospital on 7 days ago with an acute subdural hematoma with a very minimal clinical exam consistent with near brain death to patient was observed and support in the ICU over the next several days patient's is significant clinical signs of improvement and by the sixth of the day was awake opening his eyes and follow commands the patient was recommended as well as the family to go the OR for evacuation of his now acute and subacute subdural hematoma. I extensively with went over the risks and benefits as well as therapy course expectations about alternatives of surgery of a craniotomy with the patient and his family they understood and agreed to proceed forward.  Operative procedure: Patient brought into the or was induced under general anesthesia positioned supine with a shoulder bump under his left shoulder his head turned the right and positioned on a donut the left side of his head was shaved prepped and draped in routine sterile fashion a linear incision was made along the superior temporal line from the mid frontal area to the posterior temporal parietal area after infiltration 6 With epi and incision was made bone a car was used to dissect through the subperiosteal and the cerebellar retractors are placed. 2 bur holes were made one frontally 1 parietally and a craniotomy flap was turned the dura was tense and it was coagulated and incised in a cruciate fashion and acute or subacute blood meal he came out under pressure this was aspirated and then I explored the surface of the brain and both and intrinsics degree orientation to evacuate all the liquid clot as well as  significant solid clot both posteriorly and inferotemporally continue to irrigate even further catheter until no additional clot was appreciated the irrigant was clear the dura was reapproximated after I placed a J-P drain over a 3 [place] to the cortical surface Gelfoam was laid up the dura the craniotomy flap was reapproximated with about a plating system and the linear incision was closed with interrupted Vicryl in the temporalis and the subgaleal. Staples were then applied patient was head was dressed and with recovered in stable condition. At the case on it counts sponge counts were correct.

## 2013-02-23 NOTE — Anesthesia Preprocedure Evaluation (Signed)
Anesthesia Evaluation  Patient identified by MRN, date of birth, ID band Patient confused    Reviewed: Allergy & Precautions, H&P , NPO status , Patient's Chart, lab work & pertinent test results, reviewed documented beta blocker date and time   Airway       Dental   Pulmonary  + rhonchi         Cardiovascular Rhythm:Regular     Neuro/Psych    GI/Hepatic   Endo/Other    Renal/GU      Musculoskeletal   Abdominal   Peds  Hematology   Anesthesia Other Findings   Reproductive/Obstetrics                           Anesthesia Physical Anesthesia Plan  ASA: IV  Anesthesia Plan: General   Post-op Pain Management:    Induction: Intravenous  Airway Management Planned: Tracheostomy  Additional Equipment: CVP  Intra-op Plan:   Post-operative Plan: Post-operative intubation/ventilation  Informed Consent: I have reviewed the patients History and Physical, chart, labs and discussed the procedure including the risks, benefits and alternatives for the proposed anesthesia with the patient or authorized representative who has indicated his/her understanding and acceptance.     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 Type 2 DM glucose 212 Htn Renal insufficiency Cr 1.89  Plan GA via tracheostomy.  Kipp Brood, MD)        Anesthesia Quick Evaluation

## 2013-02-23 NOTE — Progress Notes (Signed)
Orthopedic Tech Progress Note Patient Details:  Austin Salas 1940-06-10 [id number] Binder delivered to patient's room Ortho Devices Type of Ortho Device: Abdominal binder Ortho Device/Splint Interventions: Ordered   [place] 02/23/2013, 12:16 PM

## 2013-02-23 NOTE — Progress Notes (Signed)
NUTRITION FOLLOW UP  INTERVENTION:  1. Resume Vital High Protein @ 50 ml/hr via PEG after craniectomy.   2. 30 ml Prostat QID.  Tube feeding regimen provides 1600 kcal (63% of needs), 165 grams of protein, and 1003 ml of H2O.    NUTRITION DIAGNOSIS: Inadequate oral intake related to inability to eat as evidenced by NPO status; ongoing.   Goal: Enteral nutrition to provide 60-70% of estimated calorie needs (22-25 kcals/kg ideal body weight) and 100% of estimated protein needs, based on ASPEN guidelines for permissive underfeeding in critically ill obese individuals; not met.  Monitor:  Vent status, TF initiation and tolerance, weight trend  ASSESSMENT: Pt is s/p recent craniotomy for R sided SDH. Pt was admitted with a large acute left subdural hematoma causing cerebral edema with a midline shift. Dramatic improvement today per MD note.  Pt had trach placed 11/28 and PEG placed 12/1. Pt remains NPO for crani today. Per RN will re-start TF after surgery.  Patient is currently intubated on ventilator support.  MV: 16.3 L/min Temp:Temp (24hrs), Avg:100.1 F (37.8 C), Min:99 F (37.2 C), Max:101.3 F (38.5 C)  Pt with elevated temps.   Height: Ht Readings from Last 1 Encounters:  02/15/13 5\' 10"  (1.778 m)    Weight: Wt Readings from Last 1 Encounters:  02/23/13 259 lb 14.8 oz (117.9 kg)  Usual weight 258 lb (117.4 kg)  Estimated Nutritional Needs: Kcal: 2548 Protein: >151 grams Fluid: > 2 L/day  Skin: no issues noted  Diet Order: NPO     Intake/Output Summary (Last 24 hours) at 02/23/13 1235 Last data filed at 02/23/13 1112  Gross per 24 hour  Intake   2015 ml  Output   2260 ml  Net   -245 ml    Last BM: 11/27   Labs:   Recent Labs Lab 02/21/13 0455 02/22/13 0410 02/23/13 0332  NA 148* 152* 152*  K 4.3 4.3 4.3  CL 115* 117* 118*  CO2 20 21 22   BUN 79* 84* 81*  CREATININE 2.01* 2.01* 1.89*  CALCIUM 9.1 9.6 9.5  MG 2.2 2.2 2.3  PHOS 4.0 3.6 3.8   GLUCOSE 237* 172* 212*    CBG (last 3)   Recent Labs  02/23/13 0437 02/23/13 0757 02/23/13 1206  GLUCAP 202* 180* 194*    Scheduled Meds: . amLODipine  10 mg Oral Daily  . antiseptic oral rinse  15 mL Mouth Rinse q12n4p  . carvedilol  3.125 mg Oral BID WC  . chlorhexidine  15 mL Mouth Rinse BID  . feeding supplement (PRO-STAT SUGAR FREE 64)  30 mL Per Tube QID  . insulin aspart  0-15 Units Subcutaneous Q4H  . pantoprazole sodium  40 mg Per Tube Q1200    Continuous Infusions: . dextrose 75 mL/hr at 02/23/13 1112  . feeding supplement (VITAL HIGH PROTEIN) Stopped (02/22/13 2000)  . fentaNYL infusion INTRAVENOUS 70 mcg/hr (02/23/13 0600)    Kendell Bane RD, LDN, CNSC [phone] Pager [phone] After Hours Pager

## 2013-02-23 NOTE — Anesthesia Postprocedure Evaluation (Signed)
  Anesthesia Post-op Note  Patient: Austin Salas  Procedure(s) Performed: Procedure(s): CRANIOTOMY HEMATOMA EVACUATION SUBDURAL (Left)  Patient Location: ICU  Anesthesia Type:General  Level of Consciousness: responds to stimulation and Patient remains intubated per anesthesia plan  Airway and Oxygen Therapy: Patient placed on Ventilator (see vital sign flow sheet for setting) and through tracheostomy tube.  Post-op Pain: none  Post-op Assessment: Post-op Vital signs reviewed  Post-op Vital Signs: Reviewed  Complications: No apparent anesthesia complications

## 2013-02-24 ENCOUNTER — Inpatient Hospital Stay (HOSPITAL_COMMUNITY): Payer: BC Managed Care – PPO

## 2013-02-24 DIAGNOSIS — S065X9A Traumatic subdural hemorrhage with loss of consciousness of unspecified duration, initial encounter: Secondary | ICD-10-CM | POA: Diagnosis present

## 2013-02-24 LAB — BASIC METABOLIC PANEL
BUN: 81 mg/dL — ABNORMAL HIGH (ref 6–23)
Chloride: 114 mEq/L — ABNORMAL HIGH (ref 96–112)
GFR calc Af Amer: 41 mL/min — ABNORMAL LOW (ref >90)
GFR calc non Af Amer: 35 mL/min — ABNORMAL LOW (ref >90)
Glucose, Bld: 265 mg/dL — ABNORMAL HIGH (ref 70–99)
Potassium: 4 mEq/L (ref 3.5–5.1)
Sodium: 150 mEq/L — ABNORMAL HIGH (ref 135–145)

## 2013-02-24 LAB — CBC
HCT: 30.5 % — ABNORMAL LOW (ref 39.0–52.0)
MCHC: 33.1 g/dL (ref 30.0–36.0)
Platelets: 245 10*3/uL (ref 150–400)
RBC: 3.3 MIL/uL — ABNORMAL LOW (ref 4.22–5.81)
RDW: 13.7 % (ref 11.5–15.5)
WBC: 9.7 10*3/uL (ref 4.0–10.5)

## 2013-02-24 LAB — GLUCOSE, CAPILLARY
Glucose-Capillary: 257 mg/dL — ABNORMAL HIGH (ref 70–99)
Glucose-Capillary: 192 mg/dL — ABNORMAL HIGH (ref 70–99)
Glucose-Capillary: 171 mg/dL — ABNORMAL HIGH (ref 70–99)

## 2013-02-24 LAB — PHOSPHORUS: Phosphorus: 4 mg/dL (ref 2.3–4.6)

## 2013-02-24 MED ORDER — IRBESARTAN 300 MG PO TABS
300.0000 mg | ORAL_TABLET | Freq: Every day | ORAL | Status: DC
  Administered 2013-02-24 – 2013-02-26 (×3): 300 mg via ORAL
  Filled 2013-02-24 (×3): qty 1

## 2013-02-24 MED ORDER — GLIMEPIRIDE 2 MG PO TABS
2.0000 mg | ORAL_TABLET | Freq: Every day | ORAL | Status: DC
  Administered 2013-02-24 – 2013-02-27 (×4): 2 mg via ORAL
  Filled 2013-02-24 (×6): qty 1

## 2013-02-24 MED ORDER — ACETAMINOPHEN 325 MG PO TABS: 650.0000 mg | ORAL_TABLET | ORAL | Status: DC | PRN

## 2013-02-24 MED ORDER — ONDANSETRON HCL 4 MG/2ML IJ SOLN: 4.0000 mg | INTRAMUSCULAR | Status: DC | PRN

## 2013-02-24 MED ORDER — CEFAZOLIN SODIUM-DEXTROSE 2-3 GM-% IV SOLR
2.0000 g | Freq: Three times a day (TID) | INTRAVENOUS | Status: AC
  Administered 2013-02-24 (×2): 2 g via INTRAVENOUS
  Filled 2013-02-24 (×2): qty 50

## 2013-02-24 MED ORDER — DEXTROSE 5 % IV SOLN
INTRAVENOUS | Status: AC
  Administered 2013-02-24: 1000 mL via INTRAVENOUS

## 2013-02-24 MED ORDER — ACETAMINOPHEN 650 MG RE SUPP: 650.0000 mg | RECTAL | Status: DC | PRN

## 2013-02-24 MED ORDER — ATORVASTATIN CALCIUM 10 MG PO TABS
10.0000 mg | ORAL_TABLET | Freq: Every day | ORAL | Status: DC
  Administered 2013-02-24 – 2013-02-26 (×3): 10 mg via ORAL
  Filled 2013-02-24 (×4): qty 1

## 2013-02-24 MED ORDER — ASPIRIN 81 MG PO CHEW
81.0000 mg | CHEWABLE_TABLET | Freq: Every day | ORAL | Status: DC
  Administered 2013-02-24 – 2013-02-27 (×4): 81 mg via ORAL
  Filled 2013-02-24 (×4): qty 1

## 2013-02-24 MED ORDER — LABETALOL HCL 5 MG/ML IV SOLN: 10.0000 mg | INTRAVENOUS | Status: DC | PRN

## 2013-02-24 MED ORDER — PANTOPRAZOLE SODIUM 40 MG IV SOLR
40.0000 mg | Freq: Every day | INTRAVENOUS | Status: DC
  Administered 2013-02-24 – 2013-02-26 (×3): 40 mg via INTRAVENOUS
  Filled 2013-02-24 (×5): qty 40

## 2013-02-24 MED ORDER — HYDROMORPHONE HCL PF 1 MG/ML IJ SOLN: 0.5000 mg | INTRAMUSCULAR | Status: DC | PRN

## 2013-02-24 MED ORDER — HYDROCHLOROTHIAZIDE 25 MG PO TABS
25.0000 mg | ORAL_TABLET | Freq: Every day | ORAL | Status: DC
  Administered 2013-02-24 – 2013-02-27 (×4): 25 mg via ORAL
  Filled 2013-02-24 (×4): qty 1

## 2013-02-24 MED ORDER — AMLODIPINE BESY-BENAZEPRIL HCL 10-20 MG PO CAPS
1.0000 | ORAL_CAPSULE | Freq: Two times a day (BID) | ORAL | Status: DC
  Filled 2013-02-24 (×2): qty 1

## 2013-02-24 MED ORDER — PROMETHAZINE HCL 25 MG PO TABS: 12.5000 mg | ORAL_TABLET | ORAL | Status: DC | PRN

## 2013-02-24 MED ORDER — AMLODIPINE BESYLATE 10 MG PO TABS
10.0000 mg | ORAL_TABLET | Freq: Every day | ORAL | Status: DC
  Administered 2013-02-24 – 2013-02-27 (×4): 10 mg via ORAL
  Filled 2013-02-24 (×4): qty 1

## 2013-02-24 MED ORDER — ONDANSETRON HCL 4 MG PO TABS: 4.0000 mg | ORAL_TABLET | ORAL | Status: DC | PRN

## 2013-02-24 MED ORDER — HYDROMORPHONE HCL PF 1 MG/ML IJ SOLN: 0.2500 mg | INTRAMUSCULAR | Status: DC | PRN

## 2013-02-24 MED ORDER — BENAZEPRIL HCL 20 MG PO TABS
20.0000 mg | ORAL_TABLET | Freq: Every day | ORAL | Status: DC
  Administered 2013-02-24 – 2013-02-27 (×4): 20 mg via ORAL
  Filled 2013-02-24 (×4): qty 1

## 2013-02-24 MED ORDER — PIOGLITAZONE HCL 45 MG PO TABS
45.0000 mg | ORAL_TABLET | Freq: Every day | ORAL | Status: DC
  Administered 2013-02-24 – 2013-02-27 (×4): 45 mg via ORAL
  Filled 2013-02-24 (×4): qty 1

## 2013-02-24 MED ORDER — VALSARTAN-HYDROCHLOROTHIAZIDE 320-25 MG PO TABS: 1.0000 | ORAL_TABLET | Freq: Every day | ORAL | Status: DC

## 2013-02-24 MED ORDER — DOCUSATE SODIUM 100 MG PO CAPS
100.0000 mg | ORAL_CAPSULE | Freq: Two times a day (BID) | ORAL | Status: DC
  Filled 2013-02-24 (×4): qty 1

## 2013-02-24 MED ORDER — LEVETIRACETAM 500 MG/5ML IV SOLN
500.0000 mg | Freq: Two times a day (BID) | INTRAVENOUS | Status: DC
  Administered 2013-02-24 – 2013-02-27 (×7): 500 mg via INTRAVENOUS
  Filled 2013-02-24 (×8): qty 5

## 2013-02-24 MED ORDER — ONDANSETRON HCL 4 MG/2ML IJ SOLN: 4.0000 mg | Freq: Once | INTRAMUSCULAR | Status: AC | PRN

## 2013-02-24 MED ORDER — POTASSIUM CHLORIDE IN NACL 20-0.9 MEQ/L-% IV SOLN
INTRAVENOUS | Status: DC
  Filled 2013-02-24 (×2): qty 1000

## 2013-02-24 MED ORDER — HALOPERIDOL LACTATE 5 MG/ML IJ SOLN
5.0000 mg | Freq: Once | INTRAMUSCULAR | Status: DC
  Filled 2013-02-24: qty 1

## 2013-02-24 MED ORDER — FREE WATER
250.0000 mL | Freq: Four times a day (QID) | Status: DC
  Administered 2013-02-24 – 2013-02-25 (×4): 250 mL

## 2013-02-24 NOTE — Progress Notes (Signed)
Respiratory therapy note- placed back to wean on vent 14/5, increased secretions.

## 2013-02-24 NOTE — Progress Notes (Signed)
Subjective: Patient reports Remains on a trach collar nonverbal  Objective: Vital signs in last 24 hours: Temp:  [99.1 F (37.3 C)-100.8 F (38.2 C)] 99.3 F (37.4 C) (12/03 0900) Pulse Rate:  [56-113] 65 (12/03 0800) Resp:  [19-31] 23 (12/03 0900) BP: (100-175)/(31-119) 118/50 mmHg (12/03 0900) SpO2:  [94 %-100 %] 100 % (12/03 0800) FiO2 (%):  [30 %] 30 % (12/03 0900) Weight:  [118.3 kg (260 lb 12.9 oz)] 118.3 kg (260 lb 12.9 oz) (12/03 0500)  Intake/Output from previous day: 12/02 0701 - 12/03 0700 In: 3343.7 [I.V.:2773.7; NG/GT:410] Out: 2970 [Urine:2580; Drains:190; Blood:200] Intake/Output this shift: Total I/O In: -  Out: 135 [Urine:135]  Awake and supports open eyes to voice and follows commands on the right with out of 5 strength with start to see some left-sided movement has a weak grip at 2/5 remains spastic and densely hemiparetic  Lab Results:  Recent Labs  02/23/13 2311 02/24/13 0445  WBC 10.8* 9.7  HGB 10.5* 10.1*  HCT 32.5* 30.5*  PLT 253 245   BMET  Recent Labs  02/23/13 2311 02/24/13 0445  NA 149* 150*  K 4.7 4.0  CL 114* 114*  CO2 23 23  GLUCOSE 267* 265*  BUN 77* 81*  CREATININE 1.86* 1.82*  CALCIUM 9.4 9.4    Studies/Results: Ct Head Wo Contrast  02/22/2013   CLINICAL DATA:  Subdural hematoma.  EXAM: CT HEAD WITHOUT CONTRAST  TECHNIQUE: Contiguous axial images were obtained from the base of the skull through the vertex without intravenous contrast.  COMPARISON:  CT head without contrast 02/15/2013  FINDINGS: A mixed density extra-axial collection over the left convexity is slightly decreased in size since the prior exam. There is no new hemorrhage. Midline shift at the foramen of Monro has slightly decreased from 12 mm to the 9.5 mm. There is slightly less effacement of the left lateral ventricle. Diffuse sulcal effacement is present. Flattening of the the basal cisterns is slightly improved.  No acute infarct or parenchymal hemorrhage is  present.  An NG tube is in place. Atherosclerotic calcifications are present at the cavernous carotid arteries. The paranasal sinuses and mastoid air cells are a clear. A previous right parietal craniectomy is again noted. No acute fracture is evident.  IMPRESSION: 1. Slight decrease in size of a large mixed density subdural hematoma. 2. Slight decrease in mass effect with significant residual midline shift and sulcal effacement.   Electronically Signed   By: Gennette Pac M.D.   On: 02/22/2013 14:17   Ir Gastrostomy Tube Mod Sed  02/22/2013   CLINICAL DATA:  72 year old male with left subdural hemorrhage and dysphagia with aspiration risk. Percutaneous gastrostomy tube is required for enteral nutrition.  EXAM: PERC PLACEMENT GASTROSTOMY  Date: 02/22/2013  MEDICATIONS: 2 g Ancef was administered intravenously within 1 hour of skin incision.  TECHNIQUE: Informed consent was obtained from the patient following explanation of the procedure, risks, benefits and alternatives. The patient understands, agrees and consents for the procedure. All questions were addressed. A time out was performed.  Maximal barrier sterile technique utilized including caps, mask, sterile gowns, sterile gloves, large sterile drape, hand hygiene, and chlorhexadine skin prep.  An angled catheter was advanced over a wire under fluoroscopic guidance through the nose, down the esophagus and into the body of the stomach. The stomach was then insufflated with several 100 ml of air. Fluoroscopy confirmed location of the gastric bubble, as well as inferior displacement of the barium stained colon. Under direct fluoroscopic  guidance, a single T-tack was placed, and the anterior gastric wall drawn up against the anterior abdominal wall. Percutaneous access was then obtained into the mid gastric body with an 18 gauge sheath needle. Aspiration of air, and injection of contrast material under fluoroscopy confirmed needle placement.  An Amplatz wire was  advanced in the gastric body and the access needle exchanged for a 9-French vascular sheath. A snare device was advanced through the vascular sheath and an Amplatz wire advanced through the angled catheter. The Amplatz wire was successfully snared and this was pulled up through the esophagus and out the mouth. A 20-French Burnell Blanks MIC-PEG tube was then connected to the snare and pulled through the mouth, down the esophagus, into the stomach and out to the anterior abdominal wall. Hand injection of contrast material confirmed intragastric location. The T-tack retention suture was then cut. The pull through peg tube was then secured with the external bumper and capped.  The patient will be observed for several hours with the newly placed tube on low wall suction to evaluate for any post procedure complication. The patient tolerated the procedure well, there is no immediate complication.  ANESTHESIA/SEDATION: None  CONTRAST:  25mL OMNIPAQUE IOHEXOL 300 MG/ML  SOLN  FLUOROSCOPY TIME:  2 min 48 seconds  PROCEDURE: 1. Fluoroscopically guided placement of percutaneous pull-through gastrostomy tube. Interventional Radiologist:  Sterling Big, MD  IMPRESSION: Successful placement of a 20 French pull through gastrostomy tube.  The tube will be ready for use in 24 hr at approximately 15:00 on 02/23/2013.  Signed,  Sterling Big, MD  [facility]  [facility]   Electronically Signed   By: Malachy Moan M.D.   On: 02/22/2013 17:31   Dg Chest Port 1 View  02/24/2013   CLINICAL DATA:  Airspace disease.  EXAM: PORTABLE CHEST - 1 VIEW  COMPARISON:  02/23/2013.  FINDINGS: Left subclavian central line in stable position. Tracheostomy tube in stable position. Poor lung volumes with mild bilateral airspace disease. Cardiomegaly with normal pulmonary vascularity. No pleural effusion or pneumothorax. No acute bony abnormality.  IMPRESSION: Stable chest with stable  support line positions and stable bilateral airspace disease. Poor lung volumes.   Electronically Signed   By: Maisie Fus  Register   On: 02/24/2013 07:21   Dg Chest Port 1 View  02/23/2013   CLINICAL DATA:  Postop for craniotomy.  Left subclavian line.  EXAM: PORTABLE CHEST - 1 VIEW  COMPARISON:  02/19/2013  FINDINGS: Left subclavian line terminates at the mid SVC. Tracheostomy is appropriately positioned. Numerous leads and wires project over the chest.  Cardiomegaly accentuated by AP portable technique. No pleural effusion or pneumothorax. Low lung volumes with resultant pulmonary interstitial prominence. Improved bibasilar aeration with mild left base atelectasis remaining  IMPRESSION: Appropriate position of left subclavian line, without pneumothorax.  Low lung volumes with improved bibasilar aeration.   Electronically Signed   By: Jeronimo Greaves M.D.   On: 02/23/2013 19:56    Assessment/Plan: Progressive mobilization can consider physical occupational therapy will check a followup head CT today  LOS: 9 days     Ova Gillentine P 02/24/2013, 10:20 AM

## 2013-02-24 NOTE — Progress Notes (Signed)
PULMONARY  / CRITICAL CARE MEDICINE  Name: Austin Salas MRN: [id number] DOB: 1940/10/25    ADMISSION DATE:  02/15/2013  REFERRING MD :  EDP PRIMARY SERVICE: PCCM   BRIEF PATIENT DESCRIPTION:  72 M s/p recent craniotomy for R sided SDH. Recovered well from that event. Admitted with large L sided SDH, severe cerebral edema, coma. Began to show improvement on 12/1, repeat head CT confirmed decrease in edema and midline shift.  NS performed left craniotomy on 12/2.  SIGNIFICANT EVENTS / STUDIES:  11/24 CT head: Large acute left subdural hematoma with maximal diameter of 2.3 cm, causing 1.5 cm left-to-right midline shift, right lateral ventricular enlargement and effacement of the basilar cisterns. 11/24 NS consult Wynetta Emery): very grave prognosis with no chance of survival and patient is 72 years old having his second subdural in 4 months and this one being a large acute subdural with minimal neurologic exam.  Recommended supportive care only and DNR 11/24 Made DNR after PCCM discussion with wife. 11/28 Trach 12/1 PEG by IR.  Head CT with decrease in size of L SDH, decrease in mass effect. 12/2 Left craniotomy (Dr. Wynetta Emery) 12/3 Trach to ATC >>>  LINES / TUBES: ETT 11/24 >> 11/28 Trach (DF) 11/28>>> Foley 11/24 >>> NGT 11/29 >>> PEG 12/1 >>> Craniotomy JP Drain 12/2 >>>  CULTURES: None  ANTIBIOTICS: None  SUBJECTIVE: Vitals stable, mildly hypertensive.  Afebrile.  OR 12/2 for left craniotomy, tolerated well. Agitated overnight, single dose of haldol given.  Otherwise no acute events.  VITAL SIGNS: Temp:  [99.1 F (37.3 C)-100.8 F (38.2 C)] 99.1 F (37.3 C) (12/03 0600) Pulse Rate:  [56-113] 100 (12/03 0600) Resp:  [19-31] 20 (12/03 0600) BP: (100-175)/(31-119) 152/66 mmHg (12/03 0600) SpO2:  [94 %-100 %] 99 % (12/03 0600) FiO2 (%):  [30 %] 30 % (12/03 0730) Weight:  [260 lb 12.9 oz (118.3 kg)] 260 lb 12.9 oz (118.3 kg) (12/03 0500) HEMODYNAMICS:   VENTILATOR  SETTINGS: Vent Mode:  [-] PSV;CPAP FiO2 (%):  [30 %] 30 % Set Rate:  [20 bmp] 20 bmp Vt Set:  [600 mL] 600 mL PEEP:  [5 cmH20] 5 cmH20 Pressure Support:  [10 cmH20-14 cmH20] 14 cmH20 Plateau Pressure:  [17 cmH20-25 cmH20] 18 cmH20 INTAKE / OUTPUT: Intake/Output     12/02 0701 - 12/03 0700 12/03 0701 - 12/04 0700   I.V. (mL/kg) 2773.7 (23.4)    Other 160    NG/GT 410    Total Intake(mL/kg) 3343.7 (28.3)    Urine (mL/kg/hr) 2580 (0.9) 135 (0.6)   Drains 190 (0.1)    Blood 200 (0.1)    Total Output 2970 135   Net +373.7 -135         PHYSICAL EXAMINATION: General:  Chronically ill appearing, trached. Neuro: Responds/follows some voice commands, moves eyes spontaneously. HEENT: Dressing to left parietal region C/D/I.  JP drain in place. Cardiovascular: RRR, no M/R/G. Lungs: scattered rhonchi Abdomen: obese, soft, NT, +BS Ext: warm, no edema  LABS:  CBC  CBC    Component Value Date/Time   WBC 9.7 02/24/2013 0445   RBC 3.30* 02/24/2013 0445   HGB 10.1* 02/24/2013 0445   HCT 30.5* 02/24/2013 0445   PLT 245 02/24/2013 0445   MCV 92.4 02/24/2013 0445   MCH 30.6 02/24/2013 0445   MCHC 33.1 02/24/2013 0445   RDW 13.7 02/24/2013 0445   LYMPHSABS 2.0 02/15/2013 1740   MONOABS 1.2* 02/15/2013 1740   EOSABS 0.2 02/15/2013 1740   BASOSABS 0.0 02/15/2013  1740     BMET    Component Value Date/Time   NA 150* 02/24/2013 0445   K 4.0 02/24/2013 0445   CL 114* 02/24/2013 0445   CO2 23 02/24/2013 0445   GLUCOSE 265* 02/24/2013 0445   BUN 81* 02/24/2013 0445   CREATININE 1.82* 02/24/2013 0445   CALCIUM 9.4 02/24/2013 0445   GFRNONAA 35* 02/24/2013 0445   GFRAA 41* 02/24/2013 0445   CXR: 12/3 low volumes, mild bilateral airspace disease. KUB 12/1:  Good distribution of oral contrast material throughout the colon to the level of the splenic flexure.   ASSESSMENT / PLAN:  PULMONARY A:  Acute resp failure due to neurological catastophe.  Trached 11/28. P:   - Tolerating PS 14/5, likely  ATC this AM. - BD as ordered. - Titrate O2 for sat. - CXR in AM.  CARDIOVASCULAR A:  Hypertension P:  - Continue amlodipine and coreg, hold ACE/ ARB for now. - PRN hydralazine to maintain SBP < 170 mmHg. - PRN metoprolol to maintain HR < 110/min.  RENAL A:   CKD, stage III. Hypernatremic - High UOP and Urine osm 622 P:   - Hold ACE/ ARB for now - Monitor BMET intermittently. - Correct electrolytes as indicated. - D/c NS and restart D5W for hypernatremia.  GASTROINTESTINAL A:   No issues. P:   - SUP: IV PPI. - Resume TF.  HEMATOLOGIC A:   Mild anemia without evidence of acute blood loss. P:  - Monitor CBC intermittently.  ENDOCRINE A:   DM II P:   - SSI ordered. - CBGs as ordered.  NEUROLOGIC A:   Massive SDH. Cerebral edema with L>R midline shift - improved on CT 12/1. S/P left craniotomy 12/2 Coma - resolved. P:   - PRN fentanyl for agitation. - Follow NS recs.  Rutherford Guys, PA - S  Will attempt weaning to [facility] today, post op for SDH evacuation.  Hydrate with free water and D5W.  Recheck labs in AM.  CC time 35 min.  Patient seen and examined, agree with above note.  I dictated the care and orders written for this patient under my direction.  Alyson Reedy, MD [phone]

## 2013-02-24 NOTE — Progress Notes (Signed)
Inpatient Diabetes Program Recommendations  AACE/ADA: New Consensus Statement on Inpatient Glycemic Control (2013)  Target Ranges:  Prepandial:   less than 140 mg/dL      Peak postprandial:   less than 180 mg/dL (1-2 hours)      Critically ill patients:  140 - 180 mg/dL   Reason for Visit: Results for COLTYN, HANNING (MRN [id number]) as of 02/24/2013 11:04  Ref. Range 02/23/2013 15:54 02/23/2013 19:48 02/24/2013 00:24 02/24/2013 03:48  Glucose-Capillary Latest Range: 70-99 mg/dL 213 (H) 086 (H) 578 (H) 245 (H)   Note CBG's increased with addition of Vital tube feeds.  May consider adding Lantus 10 units daily and Novolog 3 units q 4 hours (to cover carbohydrates in tube feeds)-to be held if tube feeds held.    Thanks, Beryl Meager, RN, BC-ADM Inpatient Diabetes Coordinator Pager [phone]

## 2013-02-24 NOTE — Progress Notes (Signed)
eLink Physician-Brief Progress Note Patient Name: Austin Salas DOB: 19-Jun-1940 MRN: [id number]  Date of Service  02/24/2013   HPI/Events of Note  Patient s/p trach/CVA now with ongoing agitation, attempting to pull out lines, etc.  Is on fentanyl IV    eICU Interventions  Plan:  One teim dose of haldol 5 mg IV for ongoing agitation   Intervention Category Major Interventions: Delirium, psychosis, severe agitation - evaluation and management  DETERDING,ELIZABETH 02/24/2013, 6:04 AM

## 2013-02-24 NOTE — Progress Notes (Signed)
Respiratory therapy note- patient was transitioned to aerosol trach collar at 28%, tolerating well at this time, continue to monitor.

## 2013-02-25 ENCOUNTER — Inpatient Hospital Stay (HOSPITAL_COMMUNITY): Payer: BC Managed Care – PPO

## 2013-02-25 LAB — BLOOD GAS, ARTERIAL
Drawn by: 29017
MECHVT: 600 mL
PEEP: 5 cmH2O
Patient temperature: 98.6
pCO2 arterial: 37.4 mmHg (ref 35.0–45.0)
pH, Arterial: 7.382 (ref 7.350–7.450)

## 2013-02-25 LAB — PHOSPHORUS: Phosphorus: 3.8 mg/dL (ref 2.3–4.6)

## 2013-02-25 LAB — POCT I-STAT 3, ART BLOOD GAS (G3+)
Acid-base deficit: 2 mmol/L (ref 0.0–2.0)
Bicarbonate: 22.2 mEq/L (ref 20.0–24.0)
Patient temperature: 99.5
TCO2: 23 mmol/L (ref 0–100)

## 2013-02-25 LAB — CBC
MCH: 29.7 pg (ref 26.0–34.0)
MCV: 93 fL (ref 78.0–100.0)
Platelets: 266 10*3/uL (ref 150–400)
RBC: 3.44 MIL/uL — ABNORMAL LOW (ref 4.22–5.81)
RDW: 13.6 % (ref 11.5–15.5)
WBC: 13 10*3/uL — ABNORMAL HIGH (ref 4.0–10.5)

## 2013-02-25 LAB — BASIC METABOLIC PANEL
Calcium: 9.2 mg/dL (ref 8.4–10.5)
Chloride: 109 mEq/L (ref 96–112)
Creatinine, Ser: 1.83 mg/dL — ABNORMAL HIGH (ref 0.50–1.35)
GFR calc Af Amer: 41 mL/min — ABNORMAL LOW (ref >90)
Sodium: 144 mEq/L (ref 135–145)

## 2013-02-25 LAB — MAGNESIUM: Magnesium: 2.3 mg/dL (ref 1.5–2.5)

## 2013-02-25 LAB — GLUCOSE, CAPILLARY: Glucose-Capillary: 186 mg/dL — ABNORMAL HIGH (ref 70–99)

## 2013-02-25 MED ORDER — MIDAZOLAM HCL 2 MG/2ML IJ SOLN
2.0000 mg | Freq: Once | INTRAMUSCULAR | Status: AC
  Administered 2013-02-25: 2 mg via INTRAVENOUS

## 2013-02-25 MED ORDER — DOCUSATE SODIUM 50 MG/5ML PO LIQD
100.0000 mg | Freq: Two times a day (BID) | ORAL | Status: DC
  Administered 2013-02-25 – 2013-03-05 (×16): 100 mg via ORAL
  Filled 2013-02-25 (×19): qty 10

## 2013-02-25 NOTE — Progress Notes (Signed)
OT Cancellation Note  Patient Details Name: Austin Salas MRN: [id number] DOB: [birth year]   Cancelled Treatment:    Reason Eval/Treat Not Completed: Patient not medically ready  [facility] Mar Walmer, OTR/L  [phone] 02/25/2013 02/25/2013, 4:45 PM

## 2013-02-25 NOTE — Progress Notes (Signed)
Respiratory therapy note-cuff pressures very high, MD aware, plans to change trach to XL #6.0. Continue to monitor.

## 2013-02-25 NOTE — Progress Notes (Signed)
Notified RT about patient's vent alarming multiple alarms and a possible air leak around the trach. The patient's vital signs were stable and was getting proper oxygenation. RT responded and brought two more RTs with him. Dr Tyson Alias was contacted via the e-link button and discussed the patient's status. Dr. Tyson Alias requested a #6 Shiley XLT be brought to the bedside for Dr. Molli Knock to insert during day shift. #6 Shiley XLT is at bedside. Am continuing to monitor the patient.

## 2013-02-25 NOTE — Procedures (Signed)
Trach Change Note:  #6 Shiley cuffed trach changed to a #6 Shiley XLT.  Sutures removed.  Tube changer placed as guide, advanced through old trach into trachea until minimal resistance met.  Old trach pulled and new XLT inserted over tube changer and advanced into trachea.  New trach passed easily with no resistance.  End tidal CO2 placed with good color change.  Bilateral breath sounds heard.  Balloon inflated, and trach secured in place with collar.  Pt tolerated procedure well.  No complications.  Performed under direct MD supervision (Dr. Molli Knock).  Rutherford Guys, [place] - S  Patient seen and examined, agree with above note.  I dictated the care and orders written for this patient under my direction.  Alyson Reedy, MD [phone]

## 2013-02-25 NOTE — Progress Notes (Signed)
Orthopedic Tech Progress Note Patient Details:  Austin Salas [birth year] [id number]  Ortho Devices Type of Ortho Device: Abdominal binder Ortho Device/Splint Interventions: Ordered   [name] 02/25/2013, 1:35 PM

## 2013-02-25 NOTE — Progress Notes (Signed)
Subjective: Patient reports Patient remains in a trach collar  Objective: Vital signs in last 24 hours: Temp:  [98.5 F (36.9 C)-100.9 F (38.3 C)] 98.5 F (36.9 C) (12/04 0700) Pulse Rate:  [37-106] 78 (12/04 0900) Resp:  [18-32] 22 (12/04 0900) BP: (98-167)/(39-74) 148/52 mmHg (12/04 0900) SpO2:  [97 %-100 %] 99 % (12/04 0900) FiO2 (%):  [28 %-30 %] 30 % (12/04 0900) Weight:  [118.3 kg (260 lb 12.9 oz)] 118.3 kg (260 lb 12.9 oz) (12/04 0500)  Intake/Output from previous day: 12/03 0701 - 12/04 0700 In: 3100.5 [I.V.:1405.5; NG/GT:1200; IV Piggyback:415] Out: 1610 [RUEAV:4098; Drains:50] Intake/Output this shift: Total I/O In: 214 [I.V.:114; NG/GT:100] Out: -   Recently sedated reportedly remains following commands with some movement of his left hand  Lab Results:  Recent Labs  02/24/13 0445 02/25/13 0500  WBC 9.7 13.0*  HGB 10.1* 10.2*  HCT 30.5* 32.0*  PLT 245 266   BMET  Recent Labs  02/24/13 0445 02/25/13 0500  NA 150* 144  K 4.0 4.2  CL 114* 109  CO2 23 22  GLUCOSE 265* 257*  BUN 81* 87*  CREATININE 1.82* 1.83*  CALCIUM 9.4 9.2    Studies/Results: Ct Head Wo Contrast  02/24/2013   CLINICAL DATA:  Evaluate subdural hematoma.  EXAM: CT HEAD WITHOUT CONTRAST  TECHNIQUE: Contiguous axial images were obtained from the base of the skull through the vertex without intravenous contrast.  COMPARISON:  CT head without contrast 02/22/2013.  FINDINGS: The patient is now status post left frontal craniotomy. An extra-axial drain is in place. The overall size of the extra-axial collection is significantly reduced. Pneumocephalus is now present on the left. No right-sided pneumocephalus is evident. Midline shift is significantly improved, now measuring only 5 mm. There is mild prominence of the extra-axial space on the right, likely related to the surgery and relief of pressure. There is significant reduction in effacement of the left lateral ventricle. The basal cisterns  are intact. The 4th ventricle is once again improved.  The paranasal sinuses and mastoid air cells are clear.  IMPRESSION: Right  1. Interval left craniotomy for drainage of the extra-axial fluid collection. 2. Significant reduction mass effect and midline shift as described. 3. Slight prominence of extra-axial fluid on the right may be related to the shift following relief of pressure.   Electronically Signed   By: Gennette Pac M.D.   On: 02/24/2013 16:47   Dg Chest Port 1 View  02/25/2013   CLINICAL DATA:  Evaluate airspace disease  EXAM: PORTABLE CHEST - 1 VIEW  COMPARISON:  Portable chest x-ray of 02/24/2013  FINDINGS: There has been further decrease in aeration with increase in basilar atelectasis. Heart size is stable. The left central venous line is unchanged in position, as is the tracheostomy.  IMPRESSION: Worsening of poor aeration with increase in bibasilar linear atelectasis.   Electronically Signed   By: Dwyane Dee M.D.   On: 02/25/2013 07:59   Dg Chest Port 1 View  02/24/2013   CLINICAL DATA:  Airspace disease.  EXAM: PORTABLE CHEST - 1 VIEW  COMPARISON:  02/23/2013.  FINDINGS: Left subclavian central line in stable position. Tracheostomy tube in stable position. Poor lung volumes with mild bilateral airspace disease. Cardiomegaly with normal pulmonary vascularity. No pleural effusion or pneumothorax. No acute bony abnormality.  IMPRESSION: Stable chest with stable support line positions and stable bilateral airspace disease. Poor lung volumes.   Electronically Signed   By: Maisie Fus  Register   On:  02/24/2013 07:21   Dg Chest Port 1 View  02/23/2013   CLINICAL DATA:  Postop for craniotomy.  Left subclavian line.  EXAM: PORTABLE CHEST - 1 VIEW  COMPARISON:  02/19/2013  FINDINGS: Left subclavian line terminates at the mid SVC. Tracheostomy is appropriately positioned. Numerous leads and wires project over the chest.  Cardiomegaly accentuated by AP portable technique. No pleural effusion or  pneumothorax. Low lung volumes with resultant pulmonary interstitial prominence. Improved bibasilar aeration with mild left base atelectasis remaining  IMPRESSION: Appropriate position of left subclavian line, without pneumothorax.  Low lung volumes with improved bibasilar aeration.   Electronically Signed   By: Jeronimo Greaves M.D.   On: 02/23/2013 19:56    Assessment/Plan: Continue to mobilize physical outpatient therapy may transfer out of the unit was stable from critical care perspective  LOS: 10 days     Austin Salas 02/25/2013, 9:51 AM

## 2013-02-25 NOTE — Progress Notes (Signed)
Respiratory therapy note- called to room for decreased sp02 89-91% on trach collar 28%. Dr. Tyson Alias called and asked to place back on ventilator full support. sp02 now 98%

## 2013-02-25 NOTE — Progress Notes (Signed)
PT Cancellation Note  Patient Details Name: Austin Salas MRN: [id number] DOB: [birth year]   Cancelled Treatment:    Reason Eval/Treat Not Completed: Patient not medically ready (Pt recently sedated for trach size change.  )  Will attempt later today if time allows.   Meilyn Heindl 02/25/2013, 10:36 AM  Jake Shark, PT DPT [phone]

## 2013-02-25 NOTE — Progress Notes (Signed)
PULMONARY  / CRITICAL CARE MEDICINE  Name: Austin Salas MRN: [id number] DOB: 1940/06/01    ADMISSION DATE:  02/15/2013  REFERRING MD :  EDP PRIMARY SERVICE: PCCM   BRIEF PATIENT DESCRIPTION:  72 M s/p recent craniotomy for R sided SDH. Recovered well from that event. Admitted with large L sided SDH, severe cerebral edema, coma. Began to show improvement on 12/1, repeat head CT confirmed decrease in edema and midline shift.  NS performed left craniotomy on 12/2.  SIGNIFICANT EVENTS / STUDIES:  11/24 CT head: Large acute left subdural hematoma with maximal diameter of 2.3 cm, causing 1.5 cm left-to-right midline shift, right lateral ventricular enlargement and effacement of the basilar cisterns. 11/24 NS consult Wynetta Emery): very grave prognosis with no chance of survival and patient is 72 years old having having his second subdural in 4 months and this one being a large acute subdural with minimal neurologic exam.  Recommended supportive care only and DNR 11/24 Made DNR after PCCM discussion with wife. 11/28 Trach 12/1 PEG by IR.  Head CT with decrease in size of L SDH, decrease in mass effect. 12/2 Left craniotomy (Dr. Wynetta Emery) 12/3 Trach to ATC   LINES / TUBES: ETT 11/24 >> 11/28 Trach (DF) 11/28>>> Foley 11/24 >>> NGT 11/29 >>> PEG 12/1 >>> Craniotomy JP Drain 12/2 >>>  CULTURES: None  ANTIBIOTICS: None  SUBJECTIVE: Vitals stable, mildly hypertensive.  Afebrile.  Tolerated ATC for most of night then placed back on PS for increased secretions.  Repeat head CT 12/3 with significant reduction in mass effect and midline shift.    VITAL SIGNS: Temp:  [99.3 F (37.4 C)-100.9 F (38.3 C)] 99.6 F (37.6 C) (12/04 0400) Pulse Rate:  [37-106] 65 (12/04 0600) Resp:  [18-32] 22 (12/04 0600) BP: (98-167)/(39-74) 139/54 mmHg (12/04 0600) SpO2:  [97 %-100 %] 97 % (12/04 0600) FiO2 (%):  [28 %-30 %] 30 % (12/04 0700) Weight:  [260 lb 12.9 oz (118.3 kg)] 260 lb 12.9 oz (118.3 kg) (12/04  0500) HEMODYNAMICS:   VENTILATOR SETTINGS: Vent Mode:  [-] PRVC FiO2 (%):  [28 %-30 %] 30 % Set Rate:  [20 bmp] 20 bmp Vt Set:  [600 mL] 600 mL PEEP:  [5 cmH20] 5 cmH20 Pressure Support:  [14 cmH20] 14 cmH20 Plateau Pressure:  [13 cmH20-19 cmH20] 19 cmH20 INTAKE / OUTPUT: Intake/Output     12/03 0701 - 12/04 0700 12/04 0701 - 12/05 0700   I.V. (mL/kg) 1405.5 (11.9)    Other 80    NG/GT 1200    IV Piggyback 415    Total Intake(mL/kg) 3100.5 (26.2)    Urine (mL/kg/hr) 1685 (0.6)    Drains     Blood     Total Output 1685     Net +1415.5           PHYSICAL EXAMINATION: General:  Chronically ill appearing, trached. Neuro: Responds/follows some voice commands, moves eyes spontaneously. More alert this AM. HEENT: Dressing to left parietal region C/D/I.  JP drain in place. Cardiovascular: RRR, no M/R/G. Lungs: scattered rhonchi Abdomen: obese, soft, NT, +BS Ext: warm, no edema  LABS:  CBC  CBC    Component Value Date/Time   WBC 13.0* 02/25/2013 0500   RBC 3.44* 02/25/2013 0500   HGB 10.2* 02/25/2013 0500   HCT 32.0* 02/25/2013 0500   PLT 266 02/25/2013 0500   MCV 93.0 02/25/2013 0500   MCH 29.7 02/25/2013 0500   MCHC 31.9 02/25/2013 0500   RDW 13.6 02/25/2013 0500  LYMPHSABS 2.0 02/15/2013 1740   MONOABS 1.2* 02/15/2013 1740   EOSABS 0.2 02/15/2013 1740   BASOSABS 0.0 02/15/2013 1740   BMET    Component Value Date/Time   NA 144 02/25/2013 0500   K 4.2 02/25/2013 0500   CL 109 02/25/2013 0500   CO2 22 02/25/2013 0500   GLUCOSE 257* 02/25/2013 0500   BUN 87* 02/25/2013 0500   CREATININE 1.83* 02/25/2013 0500   CALCIUM 9.2 02/25/2013 0500   GFRNONAA 35* 02/25/2013 0500   GFRAA 41* 02/25/2013 0500   CXR: 12/4 - worsening of poor aeration with increase in bibasilar ATX.  ASSESSMENT / PLAN:  PULMONARY A:  Acute resp failure due to neurological catastophe.  Trached 11/28. P:   - Change trach to #6 Shiley this AM. - Wean to ATC as tolerated. - BD as ordered. - Titrate  O2 for sat. - CXR in AM.  CARDIOVASCULAR A:  Hypertension P:  - Continue amlodipine and coreg, hold ACE/ ARB for now. - PRN hydralazine to maintain SBP < 170 mmHg. - PRN metoprolol to maintain HR < 110/min.  RENAL A:   CKD, stage III. Hypernatremic - High UOP and Urine osm 622, resolving. P:   - Hold ACE/ ARB for now. - Monitor BMET intermittently. - Correct electrolytes as indicated. - D/c free water. - Decrease D5W to 10-6ml/hr.  GASTROINTESTINAL A:   No issues. P:   - SUP: IV PPI. - Resume TF.  HEMATOLOGIC A:   Mild anemia without evidence of acute blood loss. P:  - Monitor CBC intermittently.  ENDOCRINE A:   DM II P:   - SSI ordered. - CBGs as ordered.  NEUROLOGIC A:   Massive SDH. Cerebral edema with L>R midline shift - improved on CT 12/1. S/P left craniotomy 12/2. Coma - resolved. P:   - PRN fentanyl for agitation. - Follow NS recs. - PT/OT consult this PM (after trach change).  Rutherford Guys, PA - S  Will switch trach to six cuffed extra long trach (leak) and will attempt to get to [facility] as fast as possible.  If tolerates TC overnight will likely move to SDU in AM.  CC time 35 min.  Patient seen and examined, agree with above note.  I dictated the care and orders written for this patient under my direction.  Alyson Reedy, MD [phone]

## 2013-02-26 ENCOUNTER — Encounter (HOSPITAL_COMMUNITY): Payer: Self-pay | Admitting: Neurosurgery

## 2013-02-26 ENCOUNTER — Inpatient Hospital Stay (HOSPITAL_COMMUNITY): Payer: BC Managed Care – PPO

## 2013-02-26 LAB — GLUCOSE, CAPILLARY
Glucose-Capillary: 188 mg/dL — ABNORMAL HIGH (ref 70–99)
Glucose-Capillary: 192 mg/dL — ABNORMAL HIGH (ref 70–99)
Glucose-Capillary: 194 mg/dL — ABNORMAL HIGH (ref 70–99)
Glucose-Capillary: 209 mg/dL — ABNORMAL HIGH (ref 70–99)
Glucose-Capillary: 210 mg/dL — ABNORMAL HIGH (ref 70–99)
Glucose-Capillary: 223 mg/dL — ABNORMAL HIGH (ref 70–99)

## 2013-02-26 LAB — CBC
HCT: 31.4 % — ABNORMAL LOW (ref 39.0–52.0)
MCH: 29.4 pg (ref 26.0–34.0)
MCHC: 32.2 g/dL (ref 30.0–36.0)
MCV: 91.5 fL (ref 78.0–100.0)
Platelets: 265 10*3/uL (ref 150–400)
RDW: 13.5 % (ref 11.5–15.5)

## 2013-02-26 LAB — BASIC METABOLIC PANEL
BUN: 96 mg/dL — ABNORMAL HIGH (ref 6–23)
Calcium: 9.4 mg/dL (ref 8.4–10.5)
Chloride: 110 mEq/L (ref 96–112)
Creatinine, Ser: 1.91 mg/dL — ABNORMAL HIGH (ref 0.50–1.35)
GFR calc Af Amer: 39 mL/min — ABNORMAL LOW (ref >90)
GFR calc non Af Amer: 33 mL/min — ABNORMAL LOW (ref >90)

## 2013-02-26 MED ORDER — FREE WATER
200.0000 mL | Freq: Three times a day (TID) | Status: DC
  Administered 2013-02-26 – 2013-02-27 (×3): 200 mL

## 2013-02-26 MED ORDER — SODIUM CHLORIDE 0.9 % IV SOLN
INTRAVENOUS | Status: DC
  Administered 2013-02-26: 15:00:00 via INTRAVENOUS
  Administered 2013-03-01 – 2013-03-02 (×2): 500 mL via INTRAVENOUS
  Administered 2013-03-05: 11:00:00 via INTRAVENOUS

## 2013-02-26 MED ORDER — INSULIN ASPART 100 UNIT/ML ~~LOC~~ SOLN
0.0000 [IU] | SUBCUTANEOUS | Status: DC
  Administered 2013-02-26: 7 [IU] via SUBCUTANEOUS
  Administered 2013-02-26: 4 [IU] via SUBCUTANEOUS
  Administered 2013-02-27 (×2): 7 [IU] via SUBCUTANEOUS
  Administered 2013-02-27 (×2): 4 [IU] via SUBCUTANEOUS
  Administered 2013-02-27: 11 [IU] via SUBCUTANEOUS
  Administered 2013-02-27 – 2013-02-28 (×5): 7 [IU] via SUBCUTANEOUS
  Administered 2013-02-28: 4 [IU] via SUBCUTANEOUS
  Administered 2013-02-28 – 2013-03-01 (×3): 7 [IU] via SUBCUTANEOUS
  Administered 2013-03-01: 4 [IU] via SUBCUTANEOUS
  Administered 2013-03-01 – 2013-03-02 (×5): 7 [IU] via SUBCUTANEOUS
  Administered 2013-03-02: 4 [IU] via SUBCUTANEOUS
  Administered 2013-03-02 – 2013-03-04 (×10): 7 [IU] via SUBCUTANEOUS
  Administered 2013-03-04: 4 [IU] via SUBCUTANEOUS
  Administered 2013-03-04: 7 [IU] via SUBCUTANEOUS
  Administered 2013-03-04: 4 [IU] via SUBCUTANEOUS
  Administered 2013-03-04: 11 [IU] via SUBCUTANEOUS
  Administered 2013-03-04: 7 [IU] via SUBCUTANEOUS
  Administered 2013-03-05 (×2): 4 [IU] via SUBCUTANEOUS
  Administered 2013-03-05: 7 [IU] via SUBCUTANEOUS

## 2013-02-26 MED ORDER — FLEET ENEMA 7-19 GM/118ML RE ENEM
1.0000 | ENEMA | Freq: Once | RECTAL | Status: AC
  Administered 2013-02-26: 1 via RECTAL
  Filled 2013-02-26: qty 1

## 2013-02-26 MED ORDER — FENTANYL CITRATE 0.05 MG/ML IJ SOLN: 25.0000 ug | INTRAMUSCULAR | Status: DC | PRN

## 2013-02-26 NOTE — Progress Notes (Signed)
UR completed.  Wean attempts in process. 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   Carlyle Lipa, RN BSN MHA CCM Trauma/Neuro ICU Case Manager [phone]

## 2013-02-26 NOTE — Progress Notes (Addendum)
PULMONARY  / CRITICAL CARE MEDICINE  Name: Austin Salas MRN: [id number] DOB: 01/08/1941    ADMISSION DATE:  02/15/2013  REFERRING MD :  EDP PRIMARY SERVICE: PCCM   BRIEF PATIENT DESCRIPTION:  72 M s/p recent craniotomy for R sided SDH. Recovered well from that event. Admitted with large L sided SDH, severe cerebral edema, coma. Began to show improvement on 12/1, repeat head CT confirmed decrease in edema and midline shift.  NS performed left craniotomy on 12/2.  SIGNIFICANT EVENTS / STUDIES:  11/24 CT head: Large acute left subdural hematoma with maximal diameter of 2.3 cm, causing 1.5 cm left-to-right midline shift, right lateral ventricular enlargement and effacement of the basilar cisterns. 11/24 NS consult Wynetta Emery): very grave prognosis with no chance of survival and patient is 72 years old having his second subdural in 4 months and this one being a large acute subdural with minimal neurologic exam.  Recommended supportive care only and DNR 11/24 Made DNR after PCCM discussion with wife. 11/28 Trach 12/1 PEG by IR.  Head CT with decrease in size of L SDH, decrease in mass effect. 12/2 Left craniotomy (Dr. Wynetta Emery) 12/3 Trach to ATC   LINES / TUBES: ETT 11/24 >> 11/28 Trach (DF) 11/28>>> Foley 11/24 >>> NGT 11/29 >>> PEG 12/1 >>> Craniotomy JP Drain 12/2 >>>  CULTURES: None  ANTIBIOTICS: None  SUBJECTIVE: Failed TC trials yesterday.. No new complaints.  VITAL SIGNS: Temp:  [98.8 F (37.1 C)-101.8 F (38.8 C)] 99.1 F (37.3 C) (12/05 0700) Pulse Rate:  [63-92] 91 (12/05 0900) Resp:  [17-34] 34 (12/05 0900) BP: (93-179)/(39-93) 151/63 mmHg (12/05 0900) SpO2:  [87 %-100 %] 99 % (12/05 0900) FiO2 (%):  [28 %-35 %] 35 % (12/05 0900) Weight:  [114.2 kg (251 lb 12.3 oz)] 114.2 kg (251 lb 12.3 oz) (12/05 0410) HEMODYNAMICS:   VENTILATOR SETTINGS: Vent Mode:  [-] PRVC FiO2 (%):  [28 %-35 %] 35 % Set Rate:  [20 bmp] 20 bmp Vt Set:  [600 mL] 600 mL PEEP:  [5 cmH20] 5  cmH20 Plateau Pressure:  [20 cmH20-22 cmH20] 22 cmH20  INTAKE / OUTPUT: Intake/Output     12/04 0701 - 12/05 0700 12/05 0701 - 12/06 0700   I.V. (mL/kg) 181 (1.6) 5 (0)   Other 180    NG/GT 1200 100   IV Piggyback 210    Total Intake(mL/kg) 1771 (15.5) 105 (0.9)   Urine (mL/kg/hr)     Drains     Total Output       Net +1771 +105        Urine Occurrence 9 x     PHYSICAL EXAMINATION: General:  Chronically ill appearing, trached. Neuro: Responds/follows some voice commands, moves eyes spontaneously. More alert. HEENT: Dressing to left parietal region C/D/I.  JP drain in place. Cardiovascular: RRR, no M/R/G. Lungs: scattered rhonchi Abdomen: obese, soft, NT, +BS Ext: warm, no edema  LABS:  CBC  CBC    Component Value Date/Time   WBC 12.6* 02/26/2013 0500   RBC 3.43* 02/26/2013 0500   HGB 10.1* 02/26/2013 0500   HCT 31.4* 02/26/2013 0500   PLT 265 02/26/2013 0500   MCV 91.5 02/26/2013 0500   MCH 29.4 02/26/2013 0500   MCHC 32.2 02/26/2013 0500   RDW 13.5 02/26/2013 0500   LYMPHSABS 2.0 02/15/2013 1740   MONOABS 1.2* 02/15/2013 1740   EOSABS 0.2 02/15/2013 1740   BASOSABS 0.0 02/15/2013 1740   BMET    Component Value Date/Time   NA 146*  02/26/2013 0500   K 4.1 02/26/2013 0500   CL 110 02/26/2013 0500   CO2 23 02/26/2013 0500   GLUCOSE 265* 02/26/2013 0500   BUN 96* 02/26/2013 0500   CREATININE 1.91* 02/26/2013 0500   CALCIUM 9.4 02/26/2013 0500   GFRNONAA 33* 02/26/2013 0500   GFRAA 39* 02/26/2013 0500   CXR: 12/4 - worsening of poor aeration with increase in bibasilar ATX.  ASSESSMENT / PLAN:  PULMONARY A:  Acute resp failure due to neurological catastophe.  Trached 11/28.  Trach changed to extra long cuffed 6 on 12/4.  Failed TC yesterday. P:   - Changed trach to #6 Shiley. - Place on TC as tolerated. - BD as ordered. - Titrate O2 for sat.  CARDIOVASCULAR A:  Hypertension P:  - Continue amlodipine and coreg, hold ACE/ ARB for now. - PRN hydralazine to maintain  SBP < 170 mmHg. - PRN metoprolol to maintain HR < 110/min. - D/C ARB.  RENAL A:   CKD, stage III. Hypernatremic - High UOP and Urine osm 622, not DI. P:   - Hold ACE for now. - D/C ARB - Monitor BMET intermittently. - Correct electrolytes as indicated. - Restart free water. - KVO IVF.  GASTROINTESTINAL A:   No issues. P:   - SUP: IV PPI. - Resume TF.  HEMATOLOGIC A:   Mild anemia without evidence of acute blood loss. P:  - Monitor CBC intermittently.  ENDOCRINE A:   DM II P:   - SSI ordered. - CBGs as ordered.  NEUROLOGIC A:   Massive SDH. Cerebral edema with L>R midline shift - improved on CT 12/1. S/P left craniotomy 12/2. Coma - resolved. P:   - PRN fentanyl for agitation. - Follow NS recs. - PT/OT consult this PM (after trach change).  CC time 35 min.  If able to tolerate TC overnight then will be ready for transfer to SDU in AM.  Restart free water given hypernatremia and recheck labs in AM.  Alyson Reedy, M.D. [facility] Pulmonary/Critical Care Medicine. Pager: [phone]. After hours pager: [phone].

## 2013-02-26 NOTE — Evaluation (Signed)
Physical Therapy Evaluation Patient Details Name: Austin Salas MRN: [id number] DOB: 10-30-1940 Today's Date: 02/26/2013 Time: 4098-1191 PT Time Calculation (min): 24 min  PT Assessment / Plan / Recommendation History of Present Illness  16 M s/p recent craniotomy for R sided SDH. Recovered well from that event. Admitted with large L sided SDH, severe cerebral edema, coma. Began to show improvement on 12/1, repeat head CT confirmed decrease in edema and midline shift.  NS performed left craniotomy on 12/2.  Clinical Impression  Pt admitted with the above. Pt currently with functional limitations due to the deficits listed below (see PT Problem List). Pt able to follow commands 25% of the time and preseverates on first command.  Pt with response delay ~2-3 seconds.  Pt limited due to overall fatigue.  Pt will benefit from skilled PT to increase their independence and safety with mobility to allow discharge to the venue listed below.      PT Assessment  Patient needs continued PT services    Follow Up Recommendations  SNF;LTACH    Barriers to Discharge        Equipment Recommendations  None recommended by PT    Recommendations for Other Services     Frequency Min 3X/week    Precautions / Restrictions Precautions Precautions: Fall Restrictions Weight Bearing Restrictions: No   Pertinent Vitals/Pain Unable to rate pain      Mobility  Bed Mobility Bed Mobility: Rolling Right;Rolling Left Rolling Right: 1: +2 Total assist;With rail Rolling Right: Patient Percentage: 10% Rolling Left: 1: +2 Total assist Rolling Left: Patient Percentage: 0% Details for Bed Mobility Assistance: Pt able to hold onto rail witht right hand rolling to left side.  Pt quickly fatigues with rolling to complete pericare Transfers Transfers: Not assessed Ambulation/Gait Ambulation/Gait Assistance: Not tested (comment) Modified Rankin (Stroke Patients Only) Modified Rankin: Severe disability     Exercises     PT Diagnosis: Difficulty walking;Hemiplegia non-dominant side  PT Problem List: Decreased strength;Decreased range of motion;Decreased activity tolerance;Decreased balance;Decreased mobility;Decreased coordination;Decreased cognition;Decreased knowledge of use of DME;Decreased safety awareness;Cardiopulmonary status limiting activity PT Treatment Interventions: Functional mobility training;Therapeutic activities;Therapeutic exercise;Balance training;Neuromuscular re-education;Cognitive remediation;Patient/family education     PT Goals(Current goals can be found in the care plan section) Acute Rehab PT Goals Patient Stated Goal: Did not set PT Goal Formulation: Patient unable to participate in goal setting Time For Goal Achievement: 03/12/13 Potential to Achieve Goals: Fair  Visit Information  Last PT Received On: 02/26/13 Assistance Needed: +2 History of Present Illness: 5 M s/p recent craniotomy for R sided SDH. Recovered well from that event. Admitted with large L sided SDH, severe cerebral edema, coma. Began to show improvement on 12/1, repeat head CT confirmed decrease in edema and midline shift.  NS performed left craniotomy on 12/2.       Prior Functioning  Home Living Family/patient expects to be discharged to:: Private residence Living Arrangements: Spouse/significant other Available Help at Discharge: Available 24 hours/day Type of Home: Mobile home Home Access: Stairs to enter [employer] of Steps: 5-6 Entrance Stairs-Rails: Right;Left;Can reach both Home Layout: One level Home Equipment: None Additional Comments: wife with breast cancer taking chemo and radiation.  Information on home environemtn and PLOF was entered from last admission date.  Will need to determine prior location and overall PLOF prior to this admission.  On last admission 08/2012 pt was d/c to SNF.   Prior Function Level of Independence: Independent Comments: Theatre manager Communication: Tracheostomy Dominant Hand: Right  Cognition  Cognition Arousal/Alertness: Lethargic Behavior During Therapy: Flat affect Overall Cognitive Status: Impaired/Different from baseline Area of Impairment: Attention;Following commands;Problem solving Current Attention Level: Focused Following Commands: Follows one step commands inconsistently (simple commands 25% of the time and perservating on first ) Problem Solving: Slow processing;Difficulty sequencing;Requires tactile cues    Extremity/Trunk Assessment Lower Extremity Assessment Lower Extremity Assessment: RLE deficits/detail;LLE deficits/detail RLE Deficits / Details: mild movement noted throughout session and no movement on commands RLE: Unable to fully assess due to pain LLE Deficits / Details: No pain to noxius stimuli; pt with tone on left side with knee flexion   Balance Balance Balance Assessed: No  End of Session PT - End of Session Equipment Utilized During Treatment: Oxygen (trach) Activity Tolerance: Patient limited by fatigue;Patient limited by lethargy Patient left: in bed;with call bell/phone within reach Nurse Communication: Mobility status;Need for lift equipment  GP     Charlyn Vialpando 02/26/2013, 10:18 AM Jake Shark, PT DPT [phone]

## 2013-02-26 NOTE — Progress Notes (Signed)
Subjective: Patient reports Remains on trach collar  Objective: Vital signs in last 24 hours: Temp:  [98.8 F (37.1 C)-101.5 F (38.6 C)] 101.2 F (38.4 C) (12/05 1553) Pulse Rate:  [63-115] 101 (12/05 1700) Resp:  [18-40] 40 (12/05 1700) BP: (93-171)/(43-74) 132/48 mmHg (12/05 1700) SpO2:  [96 %-100 %] 100 % (12/05 1700) FiO2 (%):  [30 %-35 %] 35 % (12/05 1700) Weight:  [114.2 kg (251 lb 12.3 oz)] 114.2 kg (251 lb 12.3 oz) (12/05 0410)  Intake/Output from previous day: 12/04 0701 - 12/05 0700 In: 1771 [I.V.:181; NG/GT:1200; IV Piggyback:210] Out: -  Intake/Output this shift: Total I/O In: 1110 [I.V.:125; Other:380; NG/GT:500; IV Piggyback:105] Out: 840 [Urine:840]  Olson, but easily arousable does follow commands some flicker movement of his left hand neurologic exam stable  Lab Results:  Recent Labs  02/25/13 0500 02/26/13 0500  WBC 13.0* 12.6*  HGB 10.2* 10.1*  HCT 32.0* 31.4*  PLT 266 265   BMET  Recent Labs  02/25/13 0500 02/26/13 0500  NA 144 146*  K 4.2 4.1  CL 109 110  CO2 22 23  GLUCOSE 257* 265*  BUN 87* 96*  CREATININE 1.83* 1.91*  CALCIUM 9.2 9.4    Studies/Results: Dg Chest Port 1 View  02/26/2013   CLINICAL DATA:  Airspace disease  EXAM: PORTABLE CHEST - 1 VIEW  COMPARISON:  February 25, 2013  FINDINGS: The tracheostomy is well seated. Central catheter tip is at the junction of the left innominate vein and superior vena cava. No pneumothorax.  Currently there is no edema or consolidation. Heart is mildly enlarged with normal pulmonary vascularity. No adenopathy.  IMPRESSION: Tube and catheter positions as described. No pneumothorax. No edema or consolidation. Mild cardiac enlargement is stable.   Electronically Signed   By: Bretta Bang M.D.   On: 02/26/2013 08:16   Dg Chest Port 1 View  02/25/2013   CLINICAL DATA:  Evaluate airspace disease  EXAM: PORTABLE CHEST - 1 VIEW  COMPARISON:  Portable chest x-ray of 02/24/2013  FINDINGS: There has  been further decrease in aeration with increase in basilar atelectasis. Heart size is stable. The left central venous line is unchanged in position, as is the tracheostomy.  IMPRESSION: Worsening of poor aeration with increase in bibasilar linear atelectasis.   Electronically Signed   By: Dwyane Dee M.D.   On: 02/25/2013 07:59    Assessment/Plan: Continue physical and occupational therapy per CCM and trach management per CCM  LOS: 11 days    Lisanne Ponce P 02/26/2013, 6:19 PM

## 2013-02-26 NOTE — Progress Notes (Signed)
Dr. Molli Knock was notified of patient's cbg's consistently >200.  SS insulin adjusted per order.  Also, that patient had no BM since 11/27 - fleets enema ordered.

## 2013-02-26 NOTE — Progress Notes (Signed)
Spoke with Baird Lyons, RN. Pt tolerating ATC today but RR a little too high to start PMSV today. Hopeful for readiness for speaking valve trials soon. Please order when ready. Harlon Ditty, MA CCC-SLP [phone]

## 2013-02-27 DIAGNOSIS — J9600 Acute respiratory failure, unspecified whether with hypoxia or hypercapnia: Secondary | ICD-10-CM

## 2013-02-27 LAB — PHOSPHORUS: Phosphorus: 3.3 mg/dL (ref 2.3–4.6)

## 2013-02-27 LAB — BASIC METABOLIC PANEL
BUN: 93 mg/dL — ABNORMAL HIGH (ref 6–23)
Calcium: 9.4 mg/dL (ref 8.4–10.5)
Chloride: 114 mEq/L — ABNORMAL HIGH (ref 96–112)
Creatinine, Ser: 1.71 mg/dL — ABNORMAL HIGH (ref 0.50–1.35)
GFR calc Af Amer: 44 mL/min — ABNORMAL LOW (ref >90)
Glucose, Bld: 241 mg/dL — ABNORMAL HIGH (ref 70–99)

## 2013-02-27 LAB — CBC
HCT: 32 % — ABNORMAL LOW (ref 39.0–52.0)
MCH: 29.9 pg (ref 26.0–34.0)
MCHC: 32.2 g/dL (ref 30.0–36.0)
RDW: 13.6 % (ref 11.5–15.5)
WBC: 12.9 10*3/uL — ABNORMAL HIGH (ref 4.0–10.5)

## 2013-02-27 LAB — GLUCOSE, CAPILLARY
Glucose-Capillary: 183 mg/dL — ABNORMAL HIGH (ref 70–99)
Glucose-Capillary: 244 mg/dL — ABNORMAL HIGH (ref 70–99)
Glucose-Capillary: 203 mg/dL — ABNORMAL HIGH (ref 70–99)
Glucose-Capillary: 195 mg/dL — ABNORMAL HIGH (ref 70–99)

## 2013-02-27 LAB — MAGNESIUM: Magnesium: 2.6 mg/dL — ABNORMAL HIGH (ref 1.5–2.5)

## 2013-02-27 MED ORDER — ASPIRIN 81 MG PO CHEW
81.0000 mg | CHEWABLE_TABLET | Freq: Every day | ORAL | Status: DC
  Administered 2013-02-28 – 2013-03-05 (×6): 81 mg
  Filled 2013-02-27 (×6): qty 1

## 2013-02-27 MED ORDER — PIOGLITAZONE HCL 45 MG PO TABS
45.0000 mg | ORAL_TABLET | Freq: Every day | ORAL | Status: DC
  Administered 2013-02-28 – 2013-03-03 (×4): 45 mg
  Filled 2013-02-27 (×4): qty 1

## 2013-02-27 MED ORDER — AMLODIPINE BESYLATE 10 MG PO TABS
10.0000 mg | ORAL_TABLET | Freq: Every day | ORAL | Status: DC
  Administered 2013-02-28 – 2013-03-05 (×6): 10 mg
  Filled 2013-02-27 (×6): qty 1

## 2013-02-27 MED ORDER — LEVETIRACETAM 100 MG/ML PO SOLN
500.0000 mg | Freq: Two times a day (BID) | ORAL | Status: DC
  Administered 2013-02-27 – 2013-03-05 (×13): 500 mg
  Filled 2013-02-27 (×15): qty 5

## 2013-02-27 MED ORDER — GLIMEPIRIDE 2 MG PO TABS
2.0000 mg | ORAL_TABLET | Freq: Every day | ORAL | Status: DC
  Administered 2013-02-28 – 2013-03-03 (×4): 2 mg
  Filled 2013-02-27 (×5): qty 1

## 2013-02-27 MED ORDER — FREE WATER
200.0000 mL | Status: DC
  Administered 2013-02-27 – 2013-03-02 (×18): 200 mL

## 2013-02-27 MED ORDER — BENAZEPRIL HCL 20 MG PO TABS
20.0000 mg | ORAL_TABLET | Freq: Every day | ORAL | Status: DC
  Administered 2013-02-28 – 2013-03-05 (×6): 20 mg
  Filled 2013-02-27 (×6): qty 1

## 2013-02-27 MED ORDER — CARVEDILOL 3.125 MG PO TABS
3.1250 mg | ORAL_TABLET | Freq: Two times a day (BID) | ORAL | Status: DC
  Administered 2013-02-27 – 2013-03-05 (×12): 3.125 mg
  Filled 2013-02-27 (×14): qty 1

## 2013-02-27 MED ORDER — ATORVASTATIN CALCIUM 10 MG PO TABS
10.0000 mg | ORAL_TABLET | Freq: Every day | ORAL | Status: DC
  Administered 2013-02-27 – 2013-03-04 (×6): 10 mg
  Filled 2013-02-27 (×7): qty 1

## 2013-02-27 NOTE — Progress Notes (Signed)
Patient placed back on full ventilator support per increased respiratory rate and work of breathing. Patient is tolerating vent well at this time.

## 2013-02-27 NOTE — Progress Notes (Signed)
Subjective: Patient reports somnolent.  On trach collar.  Objective: Vital signs in last 24 hours: Temp:  [98.9 F (37.2 C)-101.2 F (38.4 C)] 98.9 F (37.2 C) (12/06 0800) Pulse Rate:  [62-115] 62 (12/06 0800) Resp:  [10-40] 10 (12/06 0800) BP: (96-173)/(37-74) 128/49 mmHg (12/06 0800) SpO2:  [94 %-100 %] 100 % (12/06 0800) FiO2 (%):  [25 %-35 %] 30 % (12/06 0803) Weight:  [118.4 kg (261 lb 0.4 oz)] 118.4 kg (261 lb 0.4 oz) (12/06 0340)  Intake/Output from previous day: 12/05 0701 - 12/06 0700 In: 2695 [I.V.:385; NG/GT:1200; IV Piggyback:210] Out: 2541 [Urine:2540; Stool:1] Intake/Output this shift: Total I/O In: 210 [I.V.:60; NG/GT:150] Out: -   Physical Exam: Blinks to threat.  Pupils reactive.  Not following commands.  Moves right side spontaneously.  Not moving left side.  Lab Results:  Recent Labs  02/26/13 0500 02/27/13 0530  WBC 12.6* 12.9*  HGB 10.1* 10.3*  HCT 31.4* 32.0*  PLT 265 278   BMET  Recent Labs  02/26/13 0500 02/27/13 0530  NA 146* 151*  K 4.1 4.3  CL 110 114*  CO2 23 24  GLUCOSE 265* 241*  BUN 96* 93*  CREATININE 1.91* 1.71*  CALCIUM 9.4 9.4    Studies/Results: Dg Chest Port 1 View  02/26/2013   CLINICAL DATA:  Airspace disease  EXAM: PORTABLE CHEST - 1 VIEW  COMPARISON:  February 25, 2013  FINDINGS: The tracheostomy is well seated. Central catheter tip is at the junction of the left innominate vein and superior vena cava. No pneumothorax.  Currently there is no edema or consolidation. Heart is mildly enlarged with normal pulmonary vascularity. No adenopathy.  IMPRESSION: Tube and catheter positions as described. No pneumothorax. No edema or consolidation. Mild cardiac enlargement is stable.   Electronically Signed   By: Bretta Bang M.D.   On: 02/26/2013 08:16    Assessment/Plan: Mental status has waxed and waned.  Continue support.    LOS: 12 days    Dorian Heckle, MD 02/27/2013, 10:28 AM

## 2013-02-27 NOTE — Progress Notes (Signed)
PULMONARY  / CRITICAL CARE MEDICINE  Name: Austin Salas MRN: [id number] DOB: 08-11-1940    ADMISSION DATE:  02/15/2013  REFERRING MD :  EDP PRIMARY SERVICE: PCCM   BRIEF PATIENT DESCRIPTION:  34 M s/p recent craniotomy for R sided SDH. Recovered well from that event. Admitted with large L sided SDH, severe cerebral edema, coma. Began to show improvement on 12/1, repeat head CT confirmed decrease in edema and midline shift.  NS performed left craniotomy on 12/2.  SIGNIFICANT EVENTS / STUDIES:  11/24 CT head: Large acute left subdural hematoma with maximal diameter of 2.3 cm, causing 1.5 cm left-to-right midline shift, right lateral ventricular enlargement and effacement of the basilar cisterns. 11/24 NS consult Wynetta Emery): very grave prognosis with no chance of survival and patient is 72 years old having his second subdural in 4 months and this one being a large acute subdural with minimal neurologic exam.  Recommended supportive care only and DNR 11/24 Made DNR after PCCM discussion with wife. 11/28 Trach 12/1 PEG by IR.  Head CT with decrease in size of L SDH, decrease in mass effect. 12/2 Left craniotomy (Dr. Wynetta Emery) 12/3 Trach to ATC   LINES / TUBES: ETT 11/24 >> 11/28 Trach (DF) 11/28>>> PEG 12/1 >>>   CULTURES: None  ANTIBIOTICS: None  SUBJECTIVE:  Tolerating ATC. Not F/C or interacting   VITAL SIGNS: Temp:  [98.9 F (37.2 C)-100.7 F (38.2 C)] 100.6 F (38.1 C) (12/06 1600) Pulse Rate:  [62-98] 74 (12/06 1600) Resp:  [10-40] 25 (12/06 1600) BP: (96-173)/(37-94) 116/42 mmHg (12/06 1600) SpO2:  [94 %-100 %] 98 % (12/06 1600) FiO2 (%):  [25 %-35 %] 35 % (12/06 1600) Weight:  [118.4 kg (261 lb 0.4 oz)] 118.4 kg (261 lb 0.4 oz) (12/06 0340) HEMODYNAMICS:   VENTILATOR SETTINGS: Vent Mode:  [-] PSV;CPAP FiO2 (%):  [25 %-35 %] 35 % Set Rate:  [20 bmp] 20 bmp Vt Set:  [600 mL] 600 mL PEEP:  [5 cmH20] 5 cmH20 Pressure Support:  [5 cmH20] 5 cmH20 Plateau Pressure:   [20 cmH20] 20 cmH20  INTAKE / OUTPUT: Intake/Output     12/05 0701 - 12/06 0700 12/06 0701 - 12/07 0700   I.V. (mL/kg) 385 (3.3) 200 (1.7)   Other 900 200   NG/GT 1200 500   IV Piggyback 210    Total Intake(mL/kg) 2695 (22.8) 900 (7.6)   Urine (mL/kg/hr) 2540 (0.9) 1550 (1.2)   Stool 1 (0)    Total Output 2541 1550   Net +154 -650        Urine Occurrence  1 x    PHYSICAL EXAMINATION: General:  Chronically ill appearing, trached. Neuro: Eyes open, not F/C HEENT: Dressing to left parietal region C/D/I.  Cardiovascular: RRR, no M/R/G. Lungs: scattered rhonchi Abdomen: obese, soft, NT, +BS Ext: warm, no edema  LABS:  CBC  CBC    Component Value Date/Time   WBC 12.9* 02/27/2013 0530   RBC 3.44* 02/27/2013 0530   HGB 10.3* 02/27/2013 0530   HCT 32.0* 02/27/2013 0530   PLT 278 02/27/2013 0530   MCV 93.0 02/27/2013 0530   MCH 29.9 02/27/2013 0530   MCHC 32.2 02/27/2013 0530   RDW 13.6 02/27/2013 0530   LYMPHSABS 2.0 02/15/2013 1740   MONOABS 1.2* 02/15/2013 1740   EOSABS 0.2 02/15/2013 1740   BASOSABS 0.0 02/15/2013 1740   BMET    Component Value Date/Time   NA 151* 02/27/2013 0530   K 4.3 02/27/2013 0530   CL  114* 02/27/2013 0530   CO2 24 02/27/2013 0530   GLUCOSE 241* 02/27/2013 0530   BUN 93* 02/27/2013 0530   CREATININE 1.71* 02/27/2013 0530   CALCIUM 9.4 02/27/2013 0530   GFRNONAA 38* 02/27/2013 0530   GFRAA 44* 02/27/2013 0530   CXR: NNF  ASSESSMENT / PLAN:  PULMONARY A:  Acute resp failure due to neurological catastophe.   Trach tube dependent P:   ATC as tolerated  CARDIOVASCULAR A:  Hypertension P:  Continue current Rx  RENAL A:   CKD, stage III. Hypernatremia P:   Monitor BMET intermittently Correct electrolytes as indicated  GASTROINTESTINAL A:   No issues. P:   - SUP: IV PPI. Cont TF.  HEMATOLOGIC A:   Mild anemia without evidence of acute blood loss. P:  - Monitor CBC intermittently.  ENDOCRINE A:   DM II P:   - SSI ordered. -  CBGs as ordered.  NEUROLOGIC A:   Massive SDH. Cerebral edema, improved S/P left craniotomy 12/2. Coma - resolved. P:   - PRN fentanyl for agitation. - Follow NS recs. - PT/OT consult this PM (after trach change).   Transfer to vent SDU   Billy Fischer, MD ; [facility] [phone].  After 5:30 PM or weekends, call [phone]

## 2013-02-27 NOTE — Progress Notes (Signed)
Pt arrived from 43M; CHG wipe completed; pt responds to painful stimuli; he has not opened his eyes completely yet; will continue to monitor

## 2013-02-28 DIAGNOSIS — I6200 Nontraumatic subdural hemorrhage, unspecified: Principal | ICD-10-CM

## 2013-02-28 DIAGNOSIS — E119 Type 2 diabetes mellitus without complications: Secondary | ICD-10-CM

## 2013-02-28 DIAGNOSIS — I10 Essential (primary) hypertension: Secondary | ICD-10-CM

## 2013-02-28 DIAGNOSIS — R4020 Unspecified coma: Secondary | ICD-10-CM

## 2013-02-28 DIAGNOSIS — Z930 Tracheostomy status: Secondary | ICD-10-CM

## 2013-02-28 LAB — GLUCOSE, CAPILLARY
Glucose-Capillary: 188 mg/dL — ABNORMAL HIGH (ref 70–99)
Glucose-Capillary: 212 mg/dL — ABNORMAL HIGH (ref 70–99)
Glucose-Capillary: 220 mg/dL — ABNORMAL HIGH (ref 70–99)
Glucose-Capillary: 226 mg/dL — ABNORMAL HIGH (ref 70–99)
Glucose-Capillary: 235 mg/dL — ABNORMAL HIGH (ref 70–99)
Glucose-Capillary: 235 mg/dL — ABNORMAL HIGH (ref 70–99)
Glucose-Capillary: 257 mg/dL — ABNORMAL HIGH (ref 70–99)

## 2013-02-28 MED ORDER — INSULIN GLARGINE 100 UNIT/ML ~~LOC~~ SOLN
10.0000 [IU] | Freq: Every day | SUBCUTANEOUS | Status: DC
  Administered 2013-02-28 – 2013-03-01 (×2): 10 [IU] via SUBCUTANEOUS
  Filled 2013-02-28 (×3): qty 0.1

## 2013-02-28 NOTE — Progress Notes (Signed)
PULMONARY  / CRITICAL CARE MEDICINE  Name: Austin Salas MRN: [id number] DOB: Jul 27, 1940    ADMISSION DATE:  02/15/2013  REFERRING MD :  EDP PRIMARY SERVICE: PCCM   BRIEF PATIENT DESCRIPTION:  [name] s/p recent craniotomy for R sided SDH. Recovered well from that event. Admitted with large L sided SDH, severe cerebral edema, coma. Began to show improvement on 12/1, repeat head CT confirmed decrease in edema and midline shift.  NS performed left craniotomy on 12/2.  SIGNIFICANT EVENTS / STUDIES:  11/24 CT head: Large acute left subdural hematoma with maximal diameter of 2.3 cm, causing 1.5 cm left-to-right midline shift, right lateral ventricular enlargement and effacement of the basilar cisterns. 11/24 NS consult Wynetta Emery): very grave prognosis with no chance of survival and patient is 72 years old having his second subdural in 4 months and this one being a large acute subdural with minimal neurologic exam.  Recommended supportive care only and DNR 11/24 Made DNR after PCCM discussion with wife. 11/28 Trach 12/1 PEG by IR.  Head CT with decrease in size of L SDH, decrease in mass effect. 12/2 Left craniotomy (Dr. Wynetta Emery) 12/3 Trach to ATC   LINES / TUBES: ETT 11/24 >> 11/28 Trach (DF) 11/28>>> PEG 12/1 >>>   CULTURES: None  ANTIBIOTICS: None  SUBJECTIVE:  Tolerating ATC. Not F/C or interacting at the time of my visit.   VITAL SIGNS: Temp:  [99.4 F (37.4 C)-101 F (38.3 C)] 100 F (37.8 C) (12/07 1147) Pulse Rate:  [73-94] 88 (12/07 1147) Resp:  [25-37] 33 (12/07 1147) BP: (113-157)/(42-75) 121/53 mmHg (12/07 1147) SpO2:  [92 %-100 %] 93 % (12/07 1147) FiO2 (%):  [28 %-35 %] 28 % (12/07 1147) Weight:  [111.4 kg (245 lb 9.5 oz)] 111.4 kg (245 lb 9.5 oz) (12/06 1816) HEMODYNAMICS:   VENTILATOR SETTINGS: Vent Mode:  [-]  FiO2 (%):  [28 %-35 %] 28 %  INTAKE / OUTPUT: Intake/Output     12/06 0701 - 12/07 0700 12/07 0701 - 12/08 0700   I.V. (mL/kg) 440 (3.9)    Other  200    NG/GT 1700    IV Piggyback     Total Intake(mL/kg) 2340 (21)    Urine (mL/kg/hr) 2350 (0.9) 600 (1.1)   Stool     Total Output 2350 600   Net -10 -600        Urine Occurrence 2 x    Stool Occurrence 1 x     PHYSICAL EXAMINATION: General:  Chronically ill appearing, trached. Neuro: not responsive or F/c.  Withdraws some to pain HEENT: Dressing to left parietal region C/D/I. No nasal purulence.  Trach in place Cardiovascular: mild regular tachy Lungs: +upper airway noise, bilat rhonchi Abdomen: obese, soft, NT, +BS Ext: warm, no edema  LABS:  CBC  CBC    Component Value Date/Time   WBC 12.9* 02/27/2013 0530   RBC 3.44* 02/27/2013 0530   HGB 10.3* 02/27/2013 0530   HCT 32.0* 02/27/2013 0530   PLT 278 02/27/2013 0530   MCV 93.0 02/27/2013 0530   MCH 29.9 02/27/2013 0530   MCHC 32.2 02/27/2013 0530   RDW 13.6 02/27/2013 0530   LYMPHSABS 2.0 02/15/2013 1740   MONOABS 1.2* 02/15/2013 1740   EOSABS 0.2 02/15/2013 1740   BASOSABS 0.0 02/15/2013 1740   BMET    Component Value Date/Time   NA 151* 02/27/2013 0530   K 4.3 02/27/2013 0530   CL 114* 02/27/2013 0530   CO2 24 02/27/2013 0530  GLUCOSE 241* 02/27/2013 0530   BUN 93* 02/27/2013 0530   CREATININE 1.71* 02/27/2013 0530   CALCIUM 9.4 02/27/2013 0530   GFRNONAA 38* 02/27/2013 0530   GFRAA 44* 02/27/2013 0530   CXR: NNF  ASSESSMENT / PLAN:  PULMONARY A:  Acute resp failure due to neurological catastophe.   Trach tube dependent P:   ATC as tolerated  CARDIOVASCULAR A:  Hypertension P:  Continue current Rx  RENAL A:   CKD, stage III. Hypernatremia P:   Check bmet in am, and continue free water.  GASTROINTESTINAL A:   No issues. P:   - SUP: IV PPI. Cont TF.  HEMATOLOGIC A:   Mild anemia without evidence of acute blood loss. P:  - Monitor CBC intermittently.  ENDOCRINE A:   DM II P:   - SSI ordered. - CBGs as ordered. -add lantus at hs  NEUROLOGIC A:   Massive SDH. Cerebral edema,  improved S/P left craniotomy 12/2. Coma - resolved. P:   - PRN fentanyl for agitation. - Follow NS recs. - PT/OT consult this PM (after trach change).

## 2013-02-28 NOTE — Progress Notes (Signed)
Subjective: Patient reports (trached)  Objective: Vital signs in last 24 hours: Temp:  [99.9 F (37.7 C)-101 F (38.3 C)] 100.9 F (38.3 C) (12/07 0831) Pulse Rate:  [73-94] 89 (12/07 0831) Resp:  [25-37] 36 (12/07 0831) BP: (113-157)/(42-94) 143/55 mmHg (12/07 0831) SpO2:  [93 %-100 %] 93 % (12/07 0831) FiO2 (%):  [28 %-35 %] 28 % (12/07 0831) Weight:  [111.4 kg (245 lb 9.5 oz)] 111.4 kg (245 lb 9.5 oz) (12/06 1816)  Intake/Output from previous day: 12/06 0701 - 12/07 0700 In: 2340 [I.V.:440; NG/GT:1700] Out: 2350 [Urine:2350] Intake/Output this shift: Total I/O In: -  Out: 250 [Urine:250]  Physical Exam: Spontaneously moving right side.  Plegic left side.  Not reliably following commands.  Lab Results:  Recent Labs  02/26/13 0500 02/27/13 0530  WBC 12.6* 12.9*  HGB 10.1* 10.3*  HCT 31.4* 32.0*  PLT 265 278   BMET  Recent Labs  02/26/13 0500 02/27/13 0530  NA 146* 151*  K 4.1 4.3  CL 110 114*  CO2 23 24  GLUCOSE 265* 241*  BUN 96* 93*  CREATININE 1.91* 1.71*  CALCIUM 9.4 9.4    Studies/Results: No results found.  Assessment/Plan: Continue support.  To remain in Stepdown for time being.    LOS: 13 days    Dorian Heckle, MD 02/28/2013, 9:40 AM

## 2013-03-01 LAB — BASIC METABOLIC PANEL
BUN: 105 mg/dL — ABNORMAL HIGH (ref 6–23)
CO2: 24 mEq/L (ref 19–32)
Calcium: 9.7 mg/dL (ref 8.4–10.5)
Chloride: 117 mEq/L — ABNORMAL HIGH (ref 96–112)
Creatinine, Ser: 1.77 mg/dL — ABNORMAL HIGH (ref 0.50–1.35)
GFR calc non Af Amer: 37 mL/min — ABNORMAL LOW (ref >90)
Glucose, Bld: 261 mg/dL — ABNORMAL HIGH (ref 70–99)

## 2013-03-01 LAB — GLUCOSE, CAPILLARY
Glucose-Capillary: 234 mg/dL — ABNORMAL HIGH (ref 70–99)
Glucose-Capillary: 185 mg/dL — ABNORMAL HIGH (ref 70–99)

## 2013-03-01 NOTE — Progress Notes (Signed)
Physical Therapy Treatment Patient Details Name: VARICK KEYS MRN: [id number] DOB: May 04, 1940 Today's Date: 03/01/2013 Time: 4098-1191 PT Time Calculation (min): 10 min  PT Assessment / Plan / Recommendation  History of Present Illness 77 M s/p recent craniotomy for R sided SDH. Recovered well from that event. Admitted with large L sided SDH, severe cerebral edema, coma. Began to show improvement on 12/1, repeat head CT confirmed decrease in edema and midline shift.  NS performed left craniotomy on 12/2.   PT Comments   Patient with only minimal arousal today to noxious stimulus and to first name.  Movement of RUE, reaching for face - not to command.  Patient did not follow any commands today.  Agree with need for LTACH vs SNF at discharge.  Follow Up Recommendations  SNF;LTACH     Does the patient have the potential to tolerate intense rehabilitation     Barriers to Discharge        Equipment Recommendations  None recommended by PT    Recommendations for Other Services    Frequency Min 3X/week   Progress towards PT Goals Progress towards PT goals: Not progressing toward goals - comment (Due to decreased attention)  Plan Current plan remains appropriate    Precautions / Restrictions Precautions Precautions: Fall Restrictions Weight Bearing Restrictions: No   Pertinent Vitals/Pain     Mobility  Bed Mobility Bed Mobility: Rolling Left Rolling Right: 1: +1 Total assist Rolling Right: Patient Percentage: 0% Rolling Left: 1: +2 Total assist Rolling Left: Patient Percentage: 0% Details for Bed Mobility Assistance: No attempt to assist with bed mobility.  Repositioned patient from right to left sidelying. Transfers Transfers: Not assessed Modified Rankin (Stroke Patients Only) Modified Rankin: Severe disability    Exercises General Exercises - Upper Extremity Shoulder Flexion: PROM;Right;Left;5 reps;Sidelying Shoulder Extension: PROM;5 reps;Both;Left;Supine Shoulder  ABduction: PROM;Right;Left;5 reps;Supine (90) Shoulder ADduction: PROM;Both;5 reps;Supine Shoulder Horizontal ADduction: PROM;Both;5 reps;Supine Elbow Flexion: PROM;Both;5 reps;Supine Elbow Extension: PROM;Both;5 reps;Supine Wrist Flexion: PROM;Both;5 reps;Supine Wrist Extension: PROM;Both;5 reps;Supine Digit Composite Flexion: PROM;Both;5 reps;Supine Composite Extension: PROM;Both;5 reps;Supine General Exercises - Lower Extremity Ankle Circles/Pumps: PROM;Right;Left;5 reps;Sidelying Heel Slides: PROM;Right;Left;5 reps;Sidelying Other Exercises Other Exercises: neck PROM x 5 reps     PT Goals (current goals can now be found in the care plan section)    Visit Information  Last PT Received On: 03/01/13 Assistance Needed: +2 History of Present Illness: 46 M s/p recent craniotomy for R sided SDH. Recovered well from that event. Admitted with large L sided SDH, severe cerebral edema, coma. Began to show improvement on 12/1, repeat head CT confirmed decrease in edema and midline shift.  NS performed left craniotomy on 12/2.    Subjective Data  Subjective: No verbalizations   Cognition  Cognition Arousal/Alertness: Lethargic Behavior During Therapy: Flat affect Overall Cognitive Status: Impaired/Different from baseline Area of Impairment: Attention;Following commands Current Attention Level:  (Minimal arousal) Following Commands:  (Does not follow commands) General Comments: Minimal arousal to first name and noxious stimulus.  Left eye opened slightly.  Movement of RUE - reaching for face.    Balance     End of Session PT - End of Session Equipment Utilized During Treatment: Oxygen Janina Mayo) Activity Tolerance: Patient limited by lethargy;Other (comment) (Limited by decreased level of attention/arousal) Patient left: in bed;with call bell/phone within reach;with family/visitor present;with nursing/sitter in room Nurse Communication: Mobility status   GP     Vena Austria 03/01/2013, 1:35 PM Durenda Hurt. Earlene Plater, PT, MBA Acute Rehab Services Pager  [phone]   

## 2013-03-01 NOTE — Progress Notes (Signed)
PULMONARY  / CRITICAL CARE MEDICINE  Name: Austin Salas MRN: [id number] DOB: [birth year]    ADMISSION DATE:  02/15/2013  REFERRING MD :  EDP PRIMARY SERVICE: PCCM   BRIEF PATIENT DESCRIPTION:  72 M s/p recent craniotomy for R sided SDH. Recovered well from that event. Admitted with large L sided SDH, severe cerebral edema, coma. Began to show improvement on 12/1, repeat head CT confirmed decrease in edema and midline shift.  NS performed left craniotomy on 12/2.  SIGNIFICANT EVENTS / STUDIES:  11/24 CT head: Large acute left subdural hematoma with maximal diameter of 2.3 cm, causing 1.5 cm left-to-right midline shift, right lateral ventricular enlargement and effacement of the basilar cisterns. 11/24 NS consult Wynetta Emery): very grave prognosis with no chance of survival and patient is 72 years old having his second subdural in 4 months and this one being a large acute subdural with minimal neurologic exam.  Recommended supportive care only and DNR 11/24 Made DNR after PCCM discussion with wife. 11/28 Trach 12/1 PEG by IR.  Head CT with decrease in size of L SDH, decrease in mass effect. 12/2 Left craniotomy (Dr. Wynetta Emery) 12/3 Trach to ATC   LINES / TUBES: ETT 11/24 >> 11/28 Trach (DF) 11/28>>> PEG 12/1 >>>  CULTURES: None  ANTIBIOTICS: None  SUBJECTIVE:  Tolerating ATC. Not F/C or interacting at the time of my visit.   VITAL SIGNS: Temp:  [99.2 F (37.3 C)-100.7 F (38.2 C)] 99.2 F (37.3 C) (12/08 0738) Pulse Rate:  [69-93] 74 (12/08 0753) Resp:  [29-36] 29 (12/08 0753) BP: (113-147)/(43-82) 139/78 mmHg (12/08 0753) SpO2:  [92 %-98 %] 96 % (12/08 0753) FiO2 (%):  [28 %] 28 % (12/08 0753) Weight:  [111.3 kg (245 lb 6 oz)] 111.3 kg (245 lb 6 oz) (12/08 0422) HEMODYNAMICS:   VENTILATOR SETTINGS: Vent Mode:  [-]  FiO2 (%):  [28 %] 28 %  INTAKE / OUTPUT: Intake/Output     12/07 0701 - 12/08 0700 12/08 0701 - 12/09 0700   I.V. (mL/kg) 480 (4.3) 60 (0.5)   Other     NG/GT 2000 150   Total Intake(mL/kg) 2480 (22.3) 210 (1.9)   Urine (mL/kg/hr) 1900 (0.7) 350 (0.8)   Total Output 1900 350   Net +580 -140        Urine Occurrence 1 x     PHYSICAL EXAMINATION: General:  Chronically ill appearing, trached. Neuro: not responsive or F/c.  Withdraws some to pain HEENT: Dressing to left parietal region C/D/I. No nasal purulence.  Trach in place Cardiovascular: mild regular tachy Lungs: +upper airway noise, bilat rhonchi Abdomen: obese, soft, NT, +BS Ext: warm, no edema  LABS:  CBC  CBC    Component Value Date/Time   WBC 12.9* 02/27/2013 0530   RBC 3.44* 02/27/2013 0530   HGB 10.3* 02/27/2013 0530   HCT 32.0* 02/27/2013 0530   PLT 278 02/27/2013 0530   MCV 93.0 02/27/2013 0530   MCH 29.9 02/27/2013 0530   MCHC 32.2 02/27/2013 0530   RDW 13.6 02/27/2013 0530   LYMPHSABS 2.0 02/15/2013 1740   MONOABS 1.2* 02/15/2013 1740   EOSABS 0.2 02/15/2013 1740   BASOSABS 0.0 02/15/2013 1740   BMET    Component Value Date/Time   NA 152* 03/01/2013 0515   K 4.5 03/01/2013 0515   CL 117* 03/01/2013 0515   CO2 24 03/01/2013 0515   GLUCOSE 261* 03/01/2013 0515   BUN 105* 03/01/2013 0515   CREATININE 1.77* 03/01/2013 0515   CALCIUM  9.7 03/01/2013 0515   GFRNONAA 37* 03/01/2013 0515   GFRAA 42* 03/01/2013 0515   CXR: NNF  ASSESSMENT / PLAN:  PULMONARY A:  Acute resp failure due to neurological catastophe.   Trach tube dependent P:   ATC as tolerated  CARDIOVASCULAR A:  Hypertension P:  Continue current Rx  RENAL A:   CKD, stage III. Hypernatremia P:   Check bmet in am, and continue free water.  GASTROINTESTINAL A:   No issues. P:   - SUP: IV PPI. - Cont TF.  HEMATOLOGIC A:   Mild anemia without evidence of acute blood loss. P:  - Monitor CBC intermittently.  ENDOCRINE A:   DM II P:   - SSI ordered. - CBGs as ordered. -add lantus at hs  NEUROLOGIC A:   Massive SDH. Cerebral edema, improved S/P left craniotomy 12/2. Coma -  resolved. P:   - PRN fentanyl for agitation. - Follow NS recs. - PT/OT consult this PM (after trach change).  Alyson Reedy, M.D. [facility] Pulmonary/Critical Care Medicine. Pager: [phone]. After hours pager: [phone].

## 2013-03-01 NOTE — Progress Notes (Signed)
Inpatient Diabetes Program Recommendations  AACE/ADA: New Consensus Statement on Inpatient Glycemic Control (2013)  Target Ranges:  Prepandial:   less than 140 mg/dL      Peak postprandial:   less than 180 mg/dL (1-2 hours)      Critically ill patients:  140 - 180 mg/dL   Pt's glucose running consistently in 200 range on tube feedings.  Inpatient Diabetes Program Recommendations Insulin - Basal: xxxx Insulin - Meal Coverage: Please consider some low dose tube feeding coveerage- 3 units q 4 hrs. HgbA1C: order to assess prehospital glucose control  Thank you, Lenor Coffin, RN, CNS, Diabetes Coordinator [phone])

## 2013-03-01 NOTE — Evaluation (Signed)
Occupational Therapy Evaluation Patient Details Name: Austin Salas MRN: [id number] DOB: March 08, 1941 Today's Date: 03/01/2013 Time: 0950-1009 OT Time Calculation (min): 19 min  OT Assessment / Plan / Recommendation History of present illness 40 M s/p recent craniotomy for R sided SDH. Recovered well from that event. Admitted with large L sided SDH, severe cerebral edema, coma. Began to show improvement on 12/1, repeat head CT confirmed decrease in edema and midline shift.  NS performed left craniotomy on 12/2.   Clinical Impression   Pt admitted with above.  He was unarousable during OT evaluation despite max stimuli - he did open eyes x 2 briefly.  He does demonstrate extensor spasticity Lt. UE with no active movement noted.  Minimal spontaneous movement Rt. UE.  He followed no commands, and did not assist with any mobility or self care activities.  He was on 28% FIO2 via trach collar with sats in the mid 90s. Did note that he was more aroused on 12/5 with PT and did follow some simple commands.  He will benefit from a trial of OT to maximize his ability to assist with basic self care activities.  He will likely need SNF level rehab at discharge.     OT Assessment  Patient needs continued OT Services    Follow Up Recommendations  SNF;Supervision/Assistance - 24 hour    Barriers to Discharge      Equipment Recommendations  None recommended by OT    Recommendations for Other Services    Frequency  Min 2X/week    Precautions / Restrictions Precautions Precautions: Fall   Pertinent Vitals/Pain     ADL  Eating/Feeding: NPO Grooming: Wash/dry face;+1 Total assistance Where Assessed - Grooming: Supine, head of bed up Upper Body Bathing: +1 Total assistance Where Assessed - Upper Body Bathing: Supine, head of bed up Lower Body Bathing: +1 Total assistance Where Assessed - Lower Body Bathing: Supine, head of bed up;Rolling right and/or left Upper Body Dressing: +1 Total  assistance Where Assessed - Upper Body Dressing: Supine, head of bed up Lower Body Dressing: +1 Total assistance Where Assessed - Lower Body Dressing: Supine, head of bed up;Rolling right and/or left Toilet Transfer: +1 Total assistance (unable) Toileting - Clothing Manipulation and Hygiene: +1 Total assistance Where Assessed - Toileting Clothing Manipulation and Hygiene: Supine, head of bed flat;Rolling right and/or left Transfers/Ambulation Related to ADLs: Unable to perform safely ADL Comments: Pt unable to engage in any ADLs today due to decreased arrousal     OT Diagnosis: Generalized weakness;Cognitive deficits;Hemiplegia non-dominant side  OT Problem List: Decreased strength;Decreased activity tolerance;Decreased range of motion;Impaired balance (sitting and/or standing);Decreased cognition;Decreased coordination;Decreased knowledge of use of DME or AE;Impaired UE functional use OT Treatment Interventions: Self-care/ADL training;Neuromuscular education;DME and/or AE instruction;Therapeutic activities;Splinting;Cognitive remediation/compensation;Visual/perceptual remediation/compensation;Patient/family education;Balance training   OT Goals(Current goals can be found in the care plan section) Acute Rehab OT Goals OT Goal Formulation: Patient unable to participate in goal setting Time For Goal Achievement: 03/15/13 Potential to Achieve Goals: Fair ADL Goals Pt Will Perform Grooming: with mod assist;sitting Pt/caregiver will Perform Home Exercise Program: Left upper extremity;Increased ROM (family will be independent with PROM Lt UR) Additional ADL Goal #1: Pt will maintain sustained attention x 2 mins to familiar self care tasks Additional ADL Goal #2: Pt will follow one step commands at least 50% of the time.  Additional ADL Goal #3: Pt will sit EOB with max A x 10 mins in prep for BADLs and functional mobility   Visit Information  Last OT Received On: 03/01/13 Assistance Needed:  +2 History of Present Illness: 63 M s/p recent craniotomy for R sided SDH. Recovered well from that event. Admitted with large L sided SDH, severe cerebral edema, coma. Began to show improvement on 12/1, repeat head CT confirmed decrease in edema and midline shift.  NS performed left craniotomy on 12/2.       Prior Functioning     Home Living Family/patient expects to be discharged to:: Private residence Living Arrangements: Spouse/significant other Available Help at Discharge: Available 24 hours/day Type of Home: Mobile home Home Access: Stairs to enter [employer] of Steps: 5-6 Entrance Stairs-Rails: Right;Left;Can reach both Home Layout: One level Home Equipment: None Additional Comments: wife with breast cancer taking chemo and radiation.  Information on home environemtn and PLOF was entered from last admission date.  Will need to determine prior location and overall PLOF prior to this admission.  On last admission 08/2012 pt was d/c to SNF.   Prior Function Level of Independence: Independent Comments: Set designer Communication: Tracheostomy;Other (comment) (unarrousable during OT eval`) Dominant Hand: Right         Vision/Perception Vision - Assessment Vision Assessment: Vision not tested Additional Comments: due to arrousal Perception Perception: Not tested   Cognition  Cognition Arousal/Alertness: Lethargic Behavior During Therapy: Flat affect Overall Cognitive Status: Impaired/Different from baseline Area of Impairment: Attention;Following commands;Problem solving Current Attention Level:  (Pt did not arrouse) Following Commands:  (followed no commands) General Comments: Pt did not arrouse despite max stimuli - opened eyes very briefly x 2    Extremity/Trunk Assessment Upper Extremity Assessment Upper Extremity Assessment: RUE deficits/detail;LUE deficits/detail RUE Deficits / Details: Pt with occasional spontaneous movement  Rt. UE.  Questionable flexor spasticity Rt. elbow vs. active resistance.  Unable to determine at this time, but was able to achieve full ROM.  PROM WFL LUE Deficits / Details: Pt with extensor spasticity.  PROM WFL elbow distally.  Shoulder flex ~110; abd. ~90.  Decreased upward rotation of shoulder noted and difficult to factilitate rotation of scap so ROM limted to 90 to prevent injury to shoulder.  No active movement noted Lower Extremity Assessment Lower Extremity Assessment: Defer to PT evaluation Cervical / Trunk Assessment Cervical / Trunk Assessment: Other exceptions (Head/neck rotated to Lt.  ) Cervical / Trunk Exceptions: Head/neck rotated to Lt., but with full  PROM     Mobility Bed Mobility Bed Mobility: Rolling Right;Rolling Left Rolling Right: 1: +1 Total assist Rolling Right: Patient Percentage: 0% Rolling Left: 1: +1 Total assist Rolling Left: Patient Percentage: 0% Details for Bed Mobility Assistance: Pt did not attempt to assist Transfers Transfers: Not assessed     Exercise General Exercises - Upper Extremity Shoulder Flexion: PROM;Left;5 reps;Right;Supine Shoulder Extension: PROM;5 reps;Both;Left;Supine Shoulder ABduction: PROM;Right;Left;5 reps;Supine (90) Shoulder ADduction: PROM;Both;5 reps;Supine Shoulder Horizontal ADduction: PROM;Both;5 reps;Supine Elbow Flexion: PROM;Both;5 reps;Supine Elbow Extension: PROM;Both;5 reps;Supine Wrist Flexion: PROM;Both;5 reps;Supine Wrist Extension: PROM;Both;5 reps;Supine Digit Composite Flexion: PROM;Both;5 reps;Supine Composite Extension: PROM;Both;5 reps;Supine Other Exercises Other Exercises: neck PROM x 5 reps   Balance     End of Session OT - End of Session Activity Tolerance: Patient limited by lethargy Patient left: in bed;with call bell/phone within reach Nurse Communication: Mobility status  GO     Bay Wayson, [name] 03/01/2013, 10:38 AM

## 2013-03-02 LAB — GLUCOSE, CAPILLARY
Glucose-Capillary: 213 mg/dL — ABNORMAL HIGH (ref 70–99)
Glucose-Capillary: 234 mg/dL — ABNORMAL HIGH (ref 70–99)
Glucose-Capillary: 211 mg/dL — ABNORMAL HIGH (ref 70–99)
Glucose-Capillary: 224 mg/dL — ABNORMAL HIGH (ref 70–99)

## 2013-03-02 MED ORDER — VITAL AF 1.2 CAL PO LIQD
1000.0000 mL | ORAL | Status: DC
  Administered 2013-03-02 – 2013-03-03 (×2): 1000 mL
  Filled 2013-03-02 (×6): qty 1000

## 2013-03-02 MED ORDER — PRO-STAT SUGAR FREE PO LIQD
30.0000 mL | Freq: Two times a day (BID) | ORAL | Status: DC
  Administered 2013-03-02 – 2013-03-05 (×6): 30 mL
  Filled 2013-03-02 (×7): qty 30

## 2013-03-02 MED ORDER — INSULIN GLARGINE 100 UNIT/ML ~~LOC~~ SOLN
20.0000 [IU] | Freq: Every day | SUBCUTANEOUS | Status: DC
  Administered 2013-03-02: 20 [IU] via SUBCUTANEOUS
  Filled 2013-03-02 (×3): qty 0.2

## 2013-03-02 MED ORDER — NYSTATIN 100000 UNIT/ML MT SUSP
5.0000 mL | Freq: Four times a day (QID) | OROMUCOSAL | Status: DC
  Administered 2013-03-02 – 2013-03-05 (×13): 500000 [IU] via ORAL
  Filled 2013-03-02 (×17): qty 5

## 2013-03-02 MED ORDER — FREE WATER
300.0000 mL | Status: DC
  Administered 2013-03-02 – 2013-03-05 (×19): 300 mL

## 2013-03-02 NOTE — Progress Notes (Signed)
Trach secure. No breakdown noted at trach site. 

## 2013-03-02 NOTE — Progress Notes (Addendum)
PULMONARY  / CRITICAL CARE MEDICINE  Name: Austin Salas MRN: [id number] DOB: [birth year]    ADMISSION DATE:  02/15/2013  REFERRING MD :  EDP PRIMARY SERVICE: PCCM  BRIEF PATIENT DESCRIPTION:  72 M s/p recent craniotomy for R sided SDH. Recovered well from that event. Admitted with large L sided SDH, severe cerebral edema, coma. Began to show improvement on 12/1, repeat head CT confirmed decrease in edema and midline shift.  NS performed left craniotomy on 12/2.  SIGNIFICANT EVENTS / STUDIES:  11/24 CT head: Large acute left subdural hematoma with maximal diameter of 2.3 cm, causing 1.5 cm left-to-right midline shift, right lateral ventricular enlargement and effacement of the basilar cisterns. 11/24 NS consult Wynetta Emery): very grave prognosis with no chance of survival and patient is 72 years old having his second subdural in 4 months and this one being a large acute subdural with minimal neurologic exam.  Recommended supportive care only and DNR 11/24 Made DNR after PCCM discussion with wife. 11/28 Trach 12/1 PEG by IR.  Head CT with decrease in size of L SDH, decrease in mass effect. 12/2 Left craniotomy (Dr. Wynetta Emery) 12/3 Trach to ATC   LINES / TUBES: ETT 11/24 >> 11/28 Trach (DF) 11/28>>> PEG 12/1 >>>  CULTURES: None  ANTIBIOTICS: None  SUBJECTIVE:  Tolerating ATC. Not F/C or interacting at the time of my visit.   VITAL SIGNS: Temp:  [98.6 F (37 C)-100.7 F (38.2 C)] 99.4 F (37.4 C) (12/09 0751) Pulse Rate:  [73-91] 85 (12/09 0810) Resp:  [20-34] 28 (12/09 0810) BP: (115-168)/(47-61) 141/51 mmHg (12/09 0810) SpO2:  [94 %-100 %] 100 % (12/09 0810) FiO2 (%):  [28 %] 28 % (12/09 0810) Weight:  [111.3 kg (245 lb 6 oz)] 111.3 kg (245 lb 6 oz) (12/09 0400) HEMODYNAMICS:   VENTILATOR SETTINGS: Vent Mode:  [-]  FiO2 (%):  [28 %] 28 %  INTAKE / OUTPUT: Intake/Output     12/08 0701 - 12/09 0700 12/09 0701 - 12/10 0700   I.V. (mL/kg) 480 (4.3)    NG/GT 2200    Total  Intake(mL/kg) 2680 (24.1)    Urine (mL/kg/hr) 2800 (1) 200 (0.6)   Total Output 2800 200   Net -120 -200        Stool Occurrence 2 x     PHYSICAL EXAMINATION: General:  Chronically ill appearing, trached. Neuro: not responsive or F/c.  Withdraws some to pain HEENT: Dressing to left parietal region C/D/I. No nasal purulence.  Trach in place Cardiovascular: mild regular tachy Lungs: +upper airway noise, bilat rhonchi Abdomen: obese, soft, NT, +BS Ext: warm, no edema  LABS:  CBC  CBC    Component Value Date/Time   WBC 12.9* 02/27/2013 0530   RBC 3.44* 02/27/2013 0530   HGB 10.3* 02/27/2013 0530   HCT 32.0* 02/27/2013 0530   PLT 278 02/27/2013 0530   MCV 93.0 02/27/2013 0530   MCH 29.9 02/27/2013 0530   MCHC 32.2 02/27/2013 0530   RDW 13.6 02/27/2013 0530   LYMPHSABS 2.0 02/15/2013 1740   MONOABS 1.2* 02/15/2013 1740   EOSABS 0.2 02/15/2013 1740   BASOSABS 0.0 02/15/2013 1740   BMET    Component Value Date/Time   NA 152* 03/01/2013 0515   K 4.5 03/01/2013 0515   CL 117* 03/01/2013 0515   CO2 24 03/01/2013 0515   GLUCOSE 261* 03/01/2013 0515   BUN 105* 03/01/2013 0515   CREATININE 1.77* 03/01/2013 0515   CALCIUM 9.7 03/01/2013 0515   GFRNONAA 37*  03/01/2013 0515   GFRAA 42* 03/01/2013 0515   CXR: noted  ASSESSMENT / PLAN:  PULMONARY A:  Acute resp failure due to neurological catastophe.   Trach tube dependent P:   - ATC as tolerated, continue 28% humidified air.  CARDIOVASCULAR A:  Hypertension P:  - Continue current Rx.  RENAL A:   CKD, stage III. Hypernatremia P:   - Increased free water to 300 q4. - BMET in AM. - Replace electrolytes as indicated.  GASTROINTESTINAL A:   No issues. P:   - SUP: IV PPI. - Cont TF.  HEMATOLOGIC A:   Mild anemia without evidence of acute blood loss. P:  - Monitor CBC intermittently.  ENDOCRINE A:   DM II P:   - SSI ordered. - CBGs as ordered. - Increased lantus to 20 units sq given persistent  hyperglycemia.  NEUROLOGIC A:   Massive SDH. Cerebral edema, improved S/P left craniotomy 12/2. Coma - resolved. P:   - PRN fentanyl for agitation. - Follow NS recs. - PT/OT consult this PM (after trach change).  Spoke with wife today, informed her that we are attempting placement at this point and all questions answered.  Suspect somewhat of unrealistic expectations.  Alyson Reedy, M.D. [facility] Pulmonary/Critical Care Medicine. Pager: [phone]. After hours pager: [phone].

## 2013-03-02 NOTE — Progress Notes (Signed)
Clinical Social Work Department BRIEF PSYCHOSOCIAL ASSESSMENT 03/02/2013  Patient:  Austin Salas, Austin Salas     Account Number:  [id number]     Admit date:  02/15/2013  Clinical Social Worker:  Varney Biles  Date/Time:  03/02/2013 03:54 PM  Referred by:  Physician  Date Referred:  03/02/2013 Referred for  SNF Placement   Other Referral:   Interview type:  Family Other interview type:    PSYCHOSOCIAL DATA Living Status:  FAMILY Admitted from facility:   Level of care:   Primary support name:  Dshaun Reppucci [phone]) Primary support relationship to patient:  SPOUSE Degree of support available:   Good--pt lives with wife Alice at home.    CURRENT CONCERNS Current Concerns  Post-Acute Placement   Other Concerns:    SOCIAL WORK ASSESSMENT / PLAN CSW explained SNF search process and made bed offers to pt's wife Alice. Fulton Mole is making a joint-decision with the help of their daughter today. CSW asked Fulton Mole to call with choice facility as soon as possible, and by tomorrow morning at the latest, so facilities can submit for insurance authorization. Fulton Mole states she will do this. CSW will follow up with a phone call tomorrow if she does not hear from [place].   Assessment/plan status:  Psychosocial Support/Ongoing Assessment of Needs Other assessment/ plan:   Information/referral to community resources:   SNF (trach).    PATIENT'S/FAMILY'S RESPONSE TO PLAN OF CARE: Wife receptive to CSW phone call and understanding of SNF process. Pt has bed offers from facilities that offer trach care, and CSW waiting on wife choice.       Maryclare Labrador, MSW, [facility] Clinical Social Worker [phone]

## 2013-03-02 NOTE — Progress Notes (Addendum)
CSW spoke with pt's wife and provided bed offers ([address] [facility] [place], [place], and [facility]). CSW requested wife call CSW back with first choice as soon as possible so CSW can contact facility and they can start insurance auth. Wife will consult her daughter and inform CSW of their choices asap.  Addendum: wife Austin Salas) cell phone number: [phone]  Addendum: CSW followed up with pt's wife Austin Salas and asked if she has had a chance to discuss bed offers with her daughter. Austin Salas states she is meeting her daughter at the hospital this afternoon and will go through the choices with her then. CSW requested Alice call and leave CSW as soon as she has made preference list so CSW can notify SNF and they can get insurance authorization. CSW asked for choice by tomorrow morning at the latest. Austin Salas will call CSW with choices.  Maryclare Labrador, MSW, [facility] Clinical Social Worker [phone]

## 2013-03-02 NOTE — Progress Notes (Signed)
NUTRITION FOLLOW UP  Intervention:    Change EN regimen to Vital AF 1.2 formula at goal rate of 55 ml/hr with Prostat liquid protein 30 ml twice daily via tube to provide 1784 total kcals, 129 gm protein, 1071 ml of free water RD to follow for nutrition care plan  Nutrition Dx:   Inadequate oral intake related to inability to eat as evidenced by NPO status, ongoing  New Goal:   EN to meet > 90% of estimated nutrition needs, currently unmet  Monitor:   EN regimen & tolerance, respiratory status, weight, labs, I/O's  Assessment:   Pt is s/p recent craniotomy for R sided SDH. Pt was admitted with a large acute left subdural hematoma causing cerebral edema with a midline shift.  Patient transferred from 47M-Neuro to 2C-Stepdown 12/6.  Patient on ATC -- off ventilatory support.    Vital HP formula infusing at 50 ml/hr via PEG tube with Prostat liquid protein 30 ml 4 times daily providing 1600 kcal (63% of estimated kcal needs), 165 grams of protein (100% of estimated protein needs) and 1003 ml of free water.  Free water flushes at 300 ml every 4 hours.  RD with EN management privileges, initially consulted 11/26.  Height: Ht Readings from Last 1 Encounters:  02/27/13 5\' 10"  (1.778 m)    Weight Status:   Wt Readings from Last 1 Encounters:  03/02/13 245 lb 6 oz (111.3 kg)    Body mass index is 35.21 kg/(m^2).  Re-estimated needs:  Kcal: 1600-1800 Protein: 120-130 gm Fluid: per MD  Skin: Intact  Diet Order: NPO   Intake/Output Summary (Last 24 hours) at 03/02/13 1325 Last data filed at 03/02/13 1137  Gross per 24 hour  Intake   2700 ml  Output   2650 ml  Net     50 ml    Labs:   Recent Labs Lab 02/25/13 0500 02/26/13 0500 02/27/13 0530 03/01/13 0515  NA 144 146* 151* 152*  K 4.2 4.1 4.3 4.5  CL 109 110 114* 117*  CO2 22 23 24 24   BUN 87* 96* 93* 105*  CREATININE 1.83* 1.91* 1.71* 1.77*  CALCIUM 9.2 9.4 9.4 9.7  MG 2.3 2.4 2.6*  --   PHOS 3.8 3.8 3.3   --   GLUCOSE 257* 265* 241* 261*    CBG (last 3)   Recent Labs  03/02/13 0030 03/02/13 0415 03/02/13 0750  GLUCAP 213* 234* 211*    Scheduled Meds: . amLODipine  10 mg Per Tube Daily  . antiseptic oral rinse  15 mL Mouth Rinse q12n4p  . aspirin  81 mg Per Tube Daily  . atorvastatin  10 mg Per Tube q1800  . benazepril  20 mg Per Tube Daily  . carvedilol  3.125 mg Per Tube BID WC  . chlorhexidine  15 mL Mouth Rinse BID  . docusate  100 mg Oral BID  . feeding supplement (PRO-STAT SUGAR FREE 64)  30 mL Per Tube QID  . free water  300 mL Per Tube Q4H  . glimepiride  2 mg Per Tube QAC breakfast  . insulin aspart  0-20 Units Subcutaneous Q4H  . insulin glargine  20 Units Subcutaneous QHS  . levETIRAcetam  500 mg Per Tube BID  . nystatin  5 mL Oral QID  . pioglitazone  45 mg Per Tube Daily    Continuous Infusions: . sodium chloride 500 mL (03/01/13 0100)  . feeding supplement (VITAL HIGH PROTEIN) 1,000 mL (03/01/13 2043)  Maureen ChattersKatie Joelly Bolanos, RD, LDN Pager #: [phone] After-Hours Pager #: [phone]

## 2013-03-02 NOTE — Progress Notes (Addendum)
Clinical Social Work Department CLINICAL SOCIAL WORK PLACEMENT NOTE 03/02/2013  Patient:  Austin Salas, Austin Salas  Account Number:  [id number] Admit date:  02/15/2013  Clinical Social Worker:  Maryclare Labrador, Theresia Majors  Date/time:  03/02/2013 03:57 PM  Clinical Social Work is seeking post-discharge placement for this patient at the following level of care:   SKILLED NURSING   (*CSW will update this form in Epic as items are completed)   N/A-bed offers provided over the phone. Patient/family provided with [facility] Department of Clinical Social Work's list of facilities offering this level of care within the geographic area requested by the patient (or if unable, by the patient's family).  03/02/2013  Patient/family informed of their freedom to choose among providers that offer the needed level of care, that participate in Medicare, Medicaid or managed care program needed by the patient, have an available bed and are willing to accept the patient.  N/A-clinicals not sent to this facility.  Patient/family informed of [facility]' ownership interest in [facility], as well as of the fact that they are under no obligation to receive care at this facility.  PASARR submitted to EDS on 03/02/2013 PASARR number received from EDS on 03/02/2013  FL2 transmitted to all facilities in geographic area requested by pt/family on  03/02/2013 FL2 transmitted to all facilities within larger geographic area on   Patient informed that his/her managed care company has contracts with or will negotiate with  certain facilities, including the following:     Patient/family informed of bed offers received: 03/02/13  Patient chooses bed at [facility] Physician recommends and patient chooses bed at    Patient to be transferred to [facility] on 03/05/13   Patient to be transferred to facility by [facility]  The following physician request were entered in Epic:   Additional  Comments:   Maryclare Labrador, MSW, [facility] Clinical Social Worker [phone]

## 2013-03-02 NOTE — Progress Notes (Signed)
Inpatient Diabetes Program Recommendations  AACE/ADA: New Consensus Statement on Inpatient Glycemic Control (2013)  Target Ranges:  Prepandial:   less than 140 mg/dL      Peak postprandial:   less than 180 mg/dL (1-2 hours)      Critically ill patients:  140 - 180 mg/dL   Pt needs insulin tube feed coverage of at least 3-4 units to start:  Inpatient Diabetes Program Recommendations Insulin - Basal: xxxx Insulin - Meal Coverage: Pt on tube feeds, but they are not beneficial to the patient if the nutrients cannot be fed into the cells of the body.if the insulin is not adequate.  Hyperglycemia consistently in the 200's range indicates that the glucose is not entering and nourishing the body cells. Please add  tube feed coverage to this patient's regimen. HgbA1C: order to assess prehospital glucose control  Thank you, Lenor Coffin, RN, CNS, Diabetes Coordinator [phone])

## 2013-03-02 NOTE — Progress Notes (Signed)
SLP Trach team Note  Patient Details Name: Austin Salas MRN: [id number] DOB: 07/18/1940  Pt tolerating TC, but continues to require inflated cuff per RT note.  Will follow for readiness for SLP intervention.         [name] 03/02/2013, 9:20 AM

## 2013-03-02 NOTE — Progress Notes (Signed)
Trach secure. No breakdown noted at trach site. Inflated cuff due to increased WOB and secretions

## 2013-03-03 DIAGNOSIS — D649 Anemia, unspecified: Secondary | ICD-10-CM

## 2013-03-03 LAB — CBC
HCT: 32.1 % — ABNORMAL LOW (ref 39.0–52.0)
Hemoglobin: 9.8 g/dL — ABNORMAL LOW (ref 13.0–17.0)
MCHC: 30.5 g/dL (ref 30.0–36.0)
MCV: 97 fL (ref 78.0–100.0)
RBC: 3.31 MIL/uL — ABNORMAL LOW (ref 4.22–5.81)
RDW: 13.7 % (ref 11.5–15.5)

## 2013-03-03 LAB — BASIC METABOLIC PANEL
BUN: 133 mg/dL — ABNORMAL HIGH (ref 6–23)
CO2: 24 mEq/L (ref 19–32)
Chloride: 119 mEq/L — ABNORMAL HIGH (ref 96–112)
Creatinine, Ser: 2.14 mg/dL — ABNORMAL HIGH (ref 0.50–1.35)
GFR calc Af Amer: 34 mL/min — ABNORMAL LOW (ref >90)
GFR calc non Af Amer: 29 mL/min — ABNORMAL LOW (ref >90)
Glucose, Bld: 247 mg/dL — ABNORMAL HIGH (ref 70–99)
Potassium: 4.7 mEq/L (ref 3.5–5.1)

## 2013-03-03 LAB — GLUCOSE, CAPILLARY
Glucose-Capillary: 238 mg/dL — ABNORMAL HIGH (ref 70–99)
Glucose-Capillary: 238 mg/dL — ABNORMAL HIGH (ref 70–99)

## 2013-03-03 MED ORDER — INSULIN GLARGINE 100 UNIT/ML ~~LOC~~ SOLN
30.0000 [IU] | Freq: Every day | SUBCUTANEOUS | Status: DC
  Administered 2013-03-04: 30 [IU] via SUBCUTANEOUS
  Filled 2013-03-03 (×2): qty 0.3

## 2013-03-03 MED ORDER — INSULIN ASPART 100 UNIT/ML ~~LOC~~ SOLN
4.0000 [IU] | Freq: Four times a day (QID) | SUBCUTANEOUS | Status: DC
  Administered 2013-03-03 – 2013-03-04 (×4): 4 [IU] via SUBCUTANEOUS

## 2013-03-03 MED ORDER — DEXTROSE 5 % IV SOLN
INTRAVENOUS | Status: DC
  Administered 2013-03-03 – 2013-03-04 (×2): via INTRAVENOUS

## 2013-03-03 MED ORDER — INSULIN ASPART 100 UNIT/ML ~~LOC~~ SOLN: 4.0000 [IU] | Freq: Four times a day (QID) | SUBCUTANEOUS | Status: DC

## 2013-03-03 NOTE — Progress Notes (Signed)
Occupational Therapy Treatment Patient Details Name: Austin Salas MRN: [id number] DOB: [birth year] Today's Date: 03/03/2013 Time: 1240-1319 OT Time Calculation (min): 39 min  OT Assessment / Plan / Recommendation  History of present illness 71 Salas s/p recent craniotomy for R sided SDH. Recovered well from that event. Admitted with large L sided SDH, severe cerebral edema, coma. Began to show improvement on 12/1, repeat head CT confirmed decrease in edema and midline shift.  NS performed left craniotomy on 12/2.   OT comments  Co treat with PT.  Pt moved to EOB with total A +3.  Pt with no attempts to assist.  Pt required total A to sit EOB, no attempts to right posture, and no activation of trunk noted.  He does spontaneously move Rt. UE on occasion, but follows no commands. Will continue to follow.  Follow Up Recommendations  SNF;Supervision/Assistance - 24 hour    Barriers to Discharge       Equipment Recommendations  None recommended by OT    Recommendations for Other Services    Frequency Min 2X/week   Progress towards OT Goals Progress towards OT goals: Not progressing toward goals - comment (lethargy and cognitive deficits hinder progress)  Plan Discharge plan remains appropriate    Precautions / Restrictions Precautions Precautions: Fall   Pertinent Vitals/Pain     ADL  Eating/Feeding: NPO Grooming: +1 Total assistance Where Assessed - Grooming: Supported sitting Transfers/Ambulation Related to ADLs: unable ADL Comments: Pt moved to EOB sitting.  Pt with eyes open, but followed no commands.  Spontaneous movement of Rt. UE noted x 4.      OT Diagnosis:    OT Problem List:   OT Treatment Interventions:     OT Goals(current goals can now be found in the care plan section) Acute Rehab OT Goals Time For Goal Achievement: 03/15/13 Potential to Achieve Goals: Fair ADL Goals Pt Will Perform Grooming: with mod assist;sitting Pt/caregiver will Perform Home Exercise  Program: Left upper extremity;Increased ROM Additional ADL Goal #1: Pt will maintain sustained attention x 2 mins to familiar self care tasks Additional ADL Goal #2: Pt will follow one step commands at least 50% of the time.  Additional ADL Goal #3: Pt will sit EOB with max A x 10 mins in prep for BADLs and functional mobility   Visit Information  Last OT Received On: 03/03/13 Assistance Needed: +2 History of Present Illness: 19 Salas s/p recent craniotomy for R sided SDH. Recovered well from that event. Admitted with large L sided SDH, severe cerebral edema, coma. Began to show improvement on 12/1, repeat head CT confirmed decrease in edema and midline shift.  NS performed left craniotomy on 12/2.    Subjective Data      Prior Functioning       Cognition  Cognition Arousal/Alertness: Lethargic Behavior During Therapy: Flat affect Overall Cognitive Status: Impaired/Different from baseline Area of Impairment: Attention;Following commands Current Attention Level: Focused Following Commands:  (follows no commands) General Comments: Pt opened eyes to noxious stimulation and name calling.      Mobility  Bed Mobility Bed Mobility: Supine to Sit;Sitting - Scoot to Edge of Bed;Sit to Supine Supine to Sit: 1: +2 Total assist Supine to Sit: Patient Percentage: 0% Sitting - Scoot to Edge of Bed: 1: +2 Total assist Sitting - Scoot to Edge of Bed: Patient Percentage: 0% Sit to Supine: 1: +2 Total assist Sit to Supine: Patient Percentage: 0% Details for Bed Mobility Assistance: Pt moved to EOB with  total A +3.  Pt with no attempts to assist.  Transfers Transfers: Not assessed    Exercises      Balance Balance Balance Assessed: Yes Static Sitting Balance Static Sitting - Balance Support: No upper extremity supported Static Sitting - Level of Assistance: 1: +1 Total assist Static Sitting - Comment/# of Minutes: ~30 mins.   Facillatation of  trunk into anterior pelvic tilt and lateral  flexion bil. with no activation of trunk.  Pt initiated no balance reactions or attempts to correct for LOB   End of Session OT - End of Session Activity Tolerance: Patient limited by lethargy Patient left: in bed;with call bell/phone within reach Nurse Communication: Mobility status  GO     Austin Salas, Austin Salas Austin Salas 03/03/2013, 1:36 PM

## 2013-03-03 NOTE — Progress Notes (Signed)
PULMONARY  / CRITICAL CARE MEDICINE  Name: Austin Salas MRN: [id number] DOB: 1940/11/29    ADMISSION DATE:  02/15/2013  REFERRING MD :  EDP PRIMARY SERVICE: PCCM  BRIEF PATIENT DESCRIPTION:  50 M s/p recent craniotomy for R sided SDH. Recovered well from that event. Admitted with large L sided SDH, severe cerebral edema, coma. Began to show improvement on 12/1, repeat head CT confirmed decrease in edema and midline shift.  NS performed left craniotomy on 12/2.  SIGNIFICANT EVENTS / STUDIES:  11/24 CT head: Large acute left subdural hematoma with maximal diameter of 2.3 cm, causing 1.5 cm left-to-right midline shift, right lateral ventricular enlargement and effacement of the basilar cisterns. 11/24 NS consult Wynetta Emery): very grave prognosis with no chance of survival and patient is 72 years old having his second subdural in 4 months and this one being a large acute subdural with minimal neurologic exam.  Recommended supportive care only and DNR 11/24 Made DNR after PCCM discussion with wife. 11/28 Trach 12/1 PEG by IR.  Head CT with decrease in size of L SDH, decrease in mass effect. 12/2 Left craniotomy (Dr. Wynetta Emery) 12/3 Trach to ATC 12/10- Na rising   LINES / TUBES: ETT 11/24 >> 11/28 Trach (DF) 11/28>>> PEG 12/1 >>>  CULTURES: None  ANTIBIOTICS: None  SUBJECTIVE:  Cont to tolerate ATC.  No sig change.   VITAL SIGNS: Temp:  [98.7 F (37.1 C)-100.4 F (38 C)] 98.7 F (37.1 C) (12/10 0724) Pulse Rate:  [70-86] 70 (12/10 0906) Resp:  [25-36] 29 (12/10 0724) BP: (101-157)/(38-61) 150/61 mmHg (12/10 0906) SpO2:  [100 %] 100 % (12/10 0724) FiO2 (%):  [28 %] 28 % (12/10 0756) Weight:  [244 lb 11.4 oz (111 kg)] 244 lb 11.4 oz (111 kg) (12/10 0431) HEMODYNAMICS:   VENTILATOR SETTINGS: Vent Mode:  [-]  FiO2 (%):  [28 %] 28 %  INTAKE / OUTPUT: Intake/Output     12/09 0701 - 12/10 0700 12/10 0701 - 12/11 0700   I.V. (mL/kg) 480 (4.3)    NG/GT 2190    Total  Intake(mL/kg) 2670 (24.1)    Urine (mL/kg/hr) 1400 (0.5) 275 (1.1)   Total Output 1400 275   Net +1270 -275        Stool Occurrence 1 x     PHYSICAL EXAMINATION: General:  Chronically ill appearing, trached. Neuro:  No response, does not follow commands, does not respond to pain HEENT: Dressing to left parietal region C/D/I. Trach in place Cardiovascular: mild regular tachy Lungs: resps even non labored on ATC, few scattered rhonchi  Abdomen: obese, soft, NT, +BS Ext: warm, no edema  LABS:   Recent Labs Lab 02/26/13 0500 02/27/13 0530 03/03/13 0445  HGB 10.1* 10.3* 9.8*  HCT 31.4* 32.0* 32.1*  WBC 12.6* 12.9* 11.7*  PLT 265 278 332     Recent Labs Lab 02/25/13 0500 02/26/13 0500 02/27/13 0530 03/01/13 0515 03/03/13 0445  NA 144 146* 151* 152* 156*  K 4.2 4.1 4.3 4.5 4.7  CL 109 110 114* 117* 119*  CO2 22 23 24 24 24   GLUCOSE 257* 265* 241* 261* 247*  BUN 87* 96* 93* 105* 133*  CREATININE 1.83* 1.91* 1.71* 1.77* 2.14*  CALCIUM 9.2 9.4 9.4 9.7 9.5  MG 2.3 2.4 2.6*  --  2.8*  PHOS 3.8 3.8 3.3  --  4.8*    CXR: no new CXR 12/10  ASSESSMENT / PLAN:  PULMONARY A:  Acute resp failure due to neurological catastrophe.  Trach tube dependent P:   - ATC as tolerated, continue 28% humidified air. - intermittent f/u CXR   CARDIOVASCULAR A:  Hypertension P:  - Continue current Rx.  RENAL A:   CKD, stage III. Hypernatremia P:   - Cont free water to 300 q4. - add D5W 12/10, follow na closely, goal to 146 over 1- days - BMET in AM. - Replace electrolytes as indicated.  GASTROINTESTINAL A:   No issues. P:   - SUP: IV PPI. - Cont TF.  HEMATOLOGIC A:   Mild anemia without evidence of acute blood loss. P:  - Monitor CBC intermittently.  ENDOCRINE A:   DM II P:   - SSI ordered. - CBGs as ordered. - Increase lantus 12/10 - add TF coverage 4 units novolog q6 12/10 - d/c PO DM rx for now until better control  - ?have nutrition see to change  TF formula   NEUROLOGIC A:   Massive SDH. Cerebral edema, improved S/P left craniotomy 12/2. Coma - resolved. P:   - PRN fentanyl for agitation. - Follow NS recs. - PT/OT efforts   Awaiting SNF placement   WHITEHEART,KATHRYN, NP 03/03/2013  9:27 AM Pager: [phone] or [phone]  *Care during the described time interval was provided by me and/or other providers on the critical care team. I have reviewed this patient's available data, including medical history, events of note, physical examination and test results as part of my evaluation.  I have fully examined this patient and agree with above findings.     Mcarthur Rossetti. Tyson Alias, MD, FACP Pgr: [phone] [facility]

## 2013-03-03 NOTE — Progress Notes (Addendum)
CSW called pt's wife and asked for her preferences for trach SNF. Wife asked if there are facilities in [facility] that provide trach care. CSW explained that she does not believe the facilities in [facility] take trach pts. CSW sent clinicals to all 5 SNFs and called them to ask if they take trachs. CSW left voicemails for [facility], [facility], and [facility]. CSW spoke with admissions at [facility], who confirmed that they do not take trach pts. CSW also texted supervisor asking if there are any SNFs in [place] that take trachs. CSW will alert pt's wife of her options for placement as soon as [place] facilities contact CSW.  Addendum: [facility] reviewing clinicals now and will call CSW if they can provide bed. If so, facility will initiate insurance authorization and pt can be discharged to this facility. [facility] unable to offer bed. [facility] also stated in their voicemail for CSW that when they checked pt's insurance, his primary is [place], not [employer]. CSW called financial counseling and left a voicemail asking counselor to check into his primary insurance as this will affect bed offers at SNFs.  Addendum: CSW received call back from financial counseling, who is checking into insurance and will call CSW back after doing some more research.   Maryclare Labrador, MSW, [facility] Clinical Social Worker [phone]

## 2013-03-03 NOTE — Progress Notes (Signed)
CSW followed up with facility [facility]); SNF states they need to determine if pt's BCBS is primary or if Susa Simmonds is primary. Facility attempting to reach pt's wife Alice. CSW provided Alice's cell phone number, and facility is giving their business department this number so they can continue to attempt to reach her. CSW explained that from the hospital standpoint, pt is ready for discharge and can come to facility when a bed is available at SNF. Facility will call CSW back after they reach pt's wife.   Maryclare Labrador, MSW, [facility] Clinical Social Worker [phone]

## 2013-03-03 NOTE — Progress Notes (Signed)
Physical Therapy Treatment Patient Details Name: Austin Salas MRN: [id number] DOB: December 29, 1940 Today's Date: 03/03/2013 Time: 7829-5621 PT Time Calculation (min): 37 min  PT Assessment / Plan / Recommendation  History of Present Illness 46 M s/p recent craniotomy for R sided SDH. Recovered well from that event. Admitted with large L sided SDH, severe cerebral edema, coma. Began to show improvement on 12/1, repeat head CT confirmed decrease in edema and midline shift.  NS performed left craniotomy on 12/2.   PT Comments   Pt admitted with above. Pt currently with functional limitations due to balance and arousal deficits. Pt session difficult as pt lethargic and not aroused or following commands during session.   Pt will benefit from skilled PT to increase their independence and safety with mobility to allow discharge to the venue listed below.   Follow Up Recommendations  SNF;LTACH                 Equipment Recommendations  None recommended by PT        Frequency Min 3X/week   Progress towards PT Goals Progress towards PT goals: Progressing toward goals  Plan Current plan remains appropriate    Precautions / Restrictions Precautions Precautions: Fall Restrictions Weight Bearing Restrictions: No   Pertinent Vitals/Pain VSS, No pain    Mobility  Bed Mobility Bed Mobility: Supine to Sit;Sitting - Scoot to Edge of Bed;Sit to Supine Supine to Sit: 1: +2 Total assist Supine to Sit: Patient Percentage: 0% Sitting - Scoot to Edge of Bed: 1: +2 Total assist Sitting - Scoot to Edge of Bed: Patient Percentage: 0% Sit to Supine: 1: +2 Total assist Sit to Supine: Patient Percentage: 0% Details for Bed Mobility Assistance: Pt moved to EOB with total A +3.  Pt with no attempts to assist. Pt spontaneously moved right UE a few times but not to command.  Put wash cloth on pts' hand and asked pt to wash his face and then facilitated pt to bring wash cloth to face and pt did spontaneously  wash his eyes with the cloth once hand was factilitated to his face but pt only performed wiping face for a few seconds and then dropped cloth.   Transfers Transfers: Not assessed Ambulation/Gait Ambulation/Gait Assistance: Not tested (comment) Stairs: No Wheelchair Mobility Wheelchair Mobility: No Modified Rankin (Stroke Patients Only) Modified Rankin: Severe disability    Exercises Other Exercises Other Exercises: neck PROM x 5 reps Other Exercises: anterior/posterior stretches with blanket      PT Goals (current goals can now be found in the care plan section)    Visit Information  Last PT Received On: 03/03/13 Assistance Needed: +2 PT/OT/SLP Co-Evaluation/Treatment: Yes PT goals addressed during session: Strengthening/ROM OT goals addressed during session: Strengthening/ROM;Other (comment) (functional mobility) History of Present Illness: [name] s/p recent craniotomy for R sided SDH. Recovered well from that event. Admitted with large L sided SDH, severe cerebral edema, coma. Began to show improvement on 12/1, repeat head CT confirmed decrease in edema and midline shift.  NS performed left craniotomy on 12/2.    Subjective Data  Subjective: No verbalizations   Cognition  Cognition Arousal/Alertness: Lethargic Behavior During Therapy: Flat affect Overall Cognitive Status: Impaired/Different from baseline Area of Impairment: Attention;Following commands Current Attention Level: Focused Following Commands:  (following no commands) Problem Solving: Slow processing;Difficulty sequencing;Requires tactile cues General Comments: Pt opened eyes to noxious stimulation and name calling.      Balance  Balance Balance Assessed: Yes Static Sitting Balance Static  Sitting - Balance Support: No upper extremity supported Static Sitting - Level of Assistance: 1: +1 Total assist Static Sitting - Comment/# of Minutes: Sat 30 min with facilitation of trunk into anterior pelvic tilt and  lateral flexion bil with no activation of trunk.  Pt initiated no balance reactions or attempts to correct for LOB.    End of Session PT - End of Session Equipment Utilized During Treatment: Oxygen Janina Mayo) Activity Tolerance: Patient limited by lethargy;Other (comment) (Limited by decreased level of attention/arousal) Patient left: in bed;with call bell/phone within reach, replaced mitt on right UE Nurse Communication: Mobility status;Need for lift equipment        INGOLD,Jariyah Hackley 03/03/2013, 2:24 PM [facility] Acute Rehabilitation [phone] (pager)

## 2013-03-04 ENCOUNTER — Inpatient Hospital Stay (HOSPITAL_COMMUNITY): Payer: BC Managed Care – PPO

## 2013-03-04 LAB — GLUCOSE, CAPILLARY
Glucose-Capillary: 200 mg/dL — ABNORMAL HIGH (ref 70–99)
Glucose-Capillary: 240 mg/dL — ABNORMAL HIGH (ref 70–99)
Glucose-Capillary: 243 mg/dL — ABNORMAL HIGH (ref 70–99)
Glucose-Capillary: 248 mg/dL — ABNORMAL HIGH (ref 70–99)
Glucose-Capillary: 254 mg/dL — ABNORMAL HIGH (ref 70–99)
Glucose-Capillary: 263 mg/dL — ABNORMAL HIGH (ref 70–99)

## 2013-03-04 LAB — BASIC METABOLIC PANEL
BUN: 154 mg/dL — ABNORMAL HIGH (ref 6–23)
CO2: 20 mEq/L (ref 19–32)
Calcium: 9.2 mg/dL (ref 8.4–10.5)
Creatinine, Ser: 2.27 mg/dL — ABNORMAL HIGH (ref 0.50–1.35)
GFR calc non Af Amer: 27 mL/min — ABNORMAL LOW (ref >90)
Glucose, Bld: 301 mg/dL — ABNORMAL HIGH (ref 70–99)
Potassium: 4.6 mEq/L (ref 3.5–5.1)

## 2013-03-04 MED ORDER — SODIUM CHLORIDE 0.9 % IJ SOLN
10.0000 mL | INTRAMUSCULAR | Status: DC | PRN
  Administered 2013-03-04: 40 mL

## 2013-03-04 MED ORDER — INSULIN ASPART 100 UNIT/ML ~~LOC~~ SOLN
8.0000 [IU] | Freq: Four times a day (QID) | SUBCUTANEOUS | Status: DC
  Administered 2013-03-04 – 2013-03-05 (×5): 8 [IU] via SUBCUTANEOUS

## 2013-03-04 MED ORDER — INSULIN GLARGINE 100 UNIT/ML ~~LOC~~ SOLN
35.0000 [IU] | Freq: Every day | SUBCUTANEOUS | Status: DC
  Administered 2013-03-04: 35 [IU] via SUBCUTANEOUS
  Filled 2013-03-04 (×2): qty 0.35

## 2013-03-04 NOTE — Progress Notes (Signed)
Subjective: Patient reports Remains on trach collar  Objective: Vital signs in last 24 hours: Temp:  [98.7 F (37.1 C)-99.6 F (37.6 C)] 98.7 F (37.1 C) (12/11 0400) Pulse Rate:  [63-86] 77 (12/11 0400) Resp:  [18-34] 31 (12/11 0400) BP: (97-151)/(38-77) 112/39 mmHg (12/11 0400) SpO2:  [96 %-100 %] 98 % (12/11 0400) FiO2 (%):  [28 %] 28 % (12/11 0359)  Intake/Output from previous day: 12/10 0701 - 12/11 0700 In: 2412.5 [I.V.:767.5; NG/GT:1645] Out: 1425 [Urine:1425] Intake/Output this shift:    Grimaces and withdraws to pain in the right  Lab Results:  Recent Labs  03/03/13 0445  WBC 11.7*  HGB 9.8*  HCT 32.1*  PLT 332   BMET  Recent Labs  03/03/13 0445  NA 156*  K 4.7  CL 119*  CO2 24  GLUCOSE 247*  BUN 133*  CREATININE 2.14*  CALCIUM 9.5    Studies/Results: No results found.  Assessment/Plan: Awaiting placement correcting hypernatremia no significant neurologic change  LOS: 17 days     Rustyn Conery P 03/04/2013, 7:01 AM

## 2013-03-04 NOTE — Progress Notes (Addendum)
PULMONARY  / CRITICAL CARE MEDICINE  Name: PATRICIA PERALES MRN: [id number] DOB: [birth year]    ADMISSION DATE:  02/15/2013  REFERRING MD :  EDP PRIMARY SERVICE: PCCM  BRIEF PATIENT DESCRIPTION:  43 M s/p recent craniotomy for R sided SDH. Recovered well from that event. Admitted with large L sided SDH, severe cerebral edema, coma. Began to show improvement on 12/1, repeat head CT confirmed decrease in edema and midline shift.  NS performed left craniotomy on 12/2.  SIGNIFICANT EVENTS / STUDIES:  11/24 CT head: Large acute left subdural hematoma with maximal diameter of 2.3 cm, causing 1.5 cm left-to-right midline shift, right lateral ventricular enlargement and effacement of the basilar cisterns. 11/24 NS consult Wynetta Emery): very grave prognosis with no chance of survival and patient is 72 years old having his second subdural in 4 months and this one being a large acute subdural with minimal neurologic exam.  Recommended supportive care only and DNR 11/24 Made DNR after PCCM discussion with wife. 11/28 Trach 12/1 PEG by IR.  Head CT with decrease in size of L SDH, decrease in mass effect. 12/2 Left craniotomy (Dr. Wynetta Emery) 12/3 Trach to ATC 12/10- Na rising  12/11 bloody trach secretions, Na trending back down, awaiting SNF   LINES / TUBES: ETT 11/24 >> 11/28 Trach (DF) 11/28>>> PEG 12/1 >>>  CULTURES: None  ANTIBIOTICS: None  SUBJECTIVE:  Cont to tolerate ATC.  Bloody trach secretions.  Mildly tachypneic   VITAL SIGNS: Temp:  [98.7 F (37.1 C)-99.6 F (37.6 C)] 98.7 F (37.1 C) (12/11 0813) Pulse Rate:  [71-86] 74 (12/11 0813) Resp:  [18-32] 30 (12/11 0813) BP: (97-151)/(34-59) 129/54 mmHg (12/11 0949) SpO2:  [96 %-100 %] 98 % (12/11 0813) FiO2 (%):  [28 %] 28 % (12/11 0813) HEMODYNAMICS:   VENTILATOR SETTINGS: Vent Mode:  [-]  FiO2 (%):  [28 %] 28 %  INTAKE / OUTPUT: Intake/Output     12/10 0701 - 12/11 0700 12/11 0701 - 12/12 0700   I.V. (mL/kg) 767.5 (6.9) 400  (3.6)   NG/GT 1645 740   Total Intake(mL/kg) 2412.5 (21.7) 1140 (10.3)   Urine (mL/kg/hr) 1425 (0.5) 500 (1.2)   Total Output 1425 500   Net +987.5 +640         PHYSICAL EXAMINATION: General:  Chronically ill appearing, trached. Neuro:  No response, does not follow commands, does not respond to pain HEENT: Dressing to left parietal region C/D/I. Trach in place with bloody secretions Cardiovascular: mild regular tachy Lungs: resps even non labored on ATC, few scattered rhonchi, mildly tachypneic  Abdomen: obese, soft, NT, +BS Ext: warm, no edema  LABS:   Recent Labs Lab 02/26/13 0500 02/27/13 0530 03/03/13 0445  HGB 10.1* 10.3* 9.8*  HCT 31.4* 32.0* 32.1*  WBC 12.6* 12.9* 11.7*  PLT 265 278 332     Recent Labs Lab 02/26/13 0500 02/27/13 0530 03/01/13 0515 03/03/13 0445 03/04/13 0645  NA 146* 151* 152* 156* 148*  K 4.1 4.3 4.5 4.7 4.6  CL 110 114* 117* 119* 116*  CO2 23 24 24 24 20   GLUCOSE 265* 241* 261* 247* 301*  BUN 96* 93* 105* 133* 154*  CREATININE 1.91* 1.71* 1.77* 2.14* 2.27*  CALCIUM 9.4 9.4 9.7 9.5 9.2  MG 2.4 2.6*  --  2.8*  --   PHOS 3.8 3.3  --  4.8*  --     CXR: no new CXR 12/10  ASSESSMENT / PLAN:  PULMONARY A:  Acute resp failure due to neurological  catastrophe.   Trach tube dependent P:   - ATC as tolerated, continue 28% humidified air. - CXR 12/11  CARDIOVASCULAR A:  Hypertension P:  - Continue current Rx.  RENAL A:   CKD, stage III. Hypernatremia P:   - Cont free water 300 q4. - KVO D5W 12/11 - follow na closely, goal to 140-145 then kvo, as brain edema concerns have resolved over days - BMET in AM. - Replace electrolytes as indicated.  GASTROINTESTINAL A:   No issues. P:   - SUP: IV PPI. - Cont TF.  HEMATOLOGIC A:   Mild anemia without evidence of acute blood loss. P:  - Monitor CBC intermittently.  ENDOCRINE A:   DM II P:   - SSI ordered. - CBGs as ordered. - Increase lantus 12/11 to 35 units qhs -  increase TF coverage 8 units novolog q6 12/11 - d/c PO DM rx for now until better control  - ?have nutrition see to change TF formula   NEUROLOGIC A:   Massive SDH. Cerebral edema, improved S/P left craniotomy 12/2. Coma - resolved. P:   - PRN fentanyl for agitation. - Follow NS recs. - PT/OT efforts   Awaiting SNF placement    WHITEHEART,KATHRYN, NP 03/04/2013  10:48 AM Pager: [phone] or [phone]  *Care during the described time interval was provided by me and/or other providers on the critical care team. I have reviewed this patient's available data, including medical history, events of note, physical examination and test results as part of my evaluation.   Mcarthur Rossetti. Tyson Alias, MD, FACP Pgr: [phone] [place] [facility]

## 2013-03-04 NOTE — Progress Notes (Signed)
Physical Therapy Treatment Patient Details Name: TAIVON HAROON MRN: [id number] DOB: 10-06-1940 Today's Date: 03/04/2013 Time: 4098-1191 PT Time Calculation (min): 29 min  PT Assessment / Plan / Recommendation  History of Present Illness 64 M s/p recent craniotomy for R sided SDH. Recovered well from that event. Admitted with large L sided SDH, severe cerebral edema, coma. Began to show improvement on 12/1, repeat head CT confirmed decrease in edema and midline shift.  NS performed left craniotomy on 12/2.   PT Comments   Patient continues with very slowed if any responses and decreased ability to focus.  Did note minimal volitional movements of right UE and LE.  Would benefit from some cognitive stimulation in the room such as music and bright items from home if family comes and can be educated.  Follow Up Recommendations  SNF;LTACH     Does the patient have the potential to tolerate intense rehabilitation   No  Barriers to Discharge  None      Equipment Recommendations  None recommended by PT    Recommendations for Other Services  None  Frequency Min 3X/week   Progress towards PT Goals Progress towards PT goals: Progressing toward goals  Plan Current plan remains appropriate    Precautions / Restrictions Precautions Precautions: Fall Restrictions Weight Bearing Restrictions: No   Pertinent Vitals/Pain No obvious issues with pain during session    Mobility  Bed Mobility Bed Mobility: Right Sidelying to Sit Rolling Right: 1: +1 Total assist Rolling Right: Patient Percentage: 0% Rolling Left: 1: +1 Total assist Rolling Left: Patient Percentage: 10% Right Sidelying to Sit: 1: +2 Total assist Right Sidelying to Sit: Patient Percentage: 0% Sitting - Scoot to Edge of Bed: 2: Max assist Sit to Supine: 1: +2 Total assist Sit to Supine: Patient Percentage: 0% Details for Bed Mobility Assistance: assisted minimally with right LE for left rolling Transfers Transfers:  Lateral/Scoot Transfers Lateral/Scoot Transfers: 1: +2 Total assist Lateral Transfers: Patient Percentage: 10% Details for Transfer Assistance: leaned pt forward and scooted on pad up to head of bed; slight active attempt to assist  Modified Rankin (Stroke Patients Only) Modified Rankin: Severe disability    Exercises Other Exercises Other Exercises: cervical PROM rotation and extension     PT Goals (current goals can now be found in the care plan section)    Visit Information  Last PT Received On: 03/04/13 Assistance Needed: +2 History of Present Illness: 53 M s/p recent craniotomy for R sided SDH. Recovered well from that event. Admitted with large L sided SDH, severe cerebral edema, coma. Began to show improvement on 12/1, repeat head CT confirmed decrease in edema and midline shift.  NS performed left craniotomy on 12/2.    Subjective Data      Cognition  Cognition Arousal/Alertness: Lethargic Behavior During Therapy: Flat affect Overall Cognitive Status: Impaired/Different from baseline Area of Impairment: Attention;Following commands Current Attention Level: Focused Problem Solving: Slow processing;Difficulty sequencing;Requires tactile cues General Comments: opens eyes to focus briefly with left eye only; requires max stimulation to maintain focus for brief seconds only    Balance  Static Sitting Balance Static Sitting - Balance Support: Feet supported;No upper extremity supported Static Sitting - Level of Assistance: 2: Max assist Static Sitting - Comment/# of Minutes: sitting with support from behind to; worked on cervical rotation, focus with gaze for minutes at a time, head righting, and lateral scoots; sat approx 15 minutes  End of Session PT - End of Session Equipment Utilized During Treatment:  Gait belt;Oxygen Activity Tolerance: Patient limited by lethargy Patient left: in bed;with call bell/phone within reach   GP     [facility] 03/04/2013, 10:48  AM Sheran Lawless, PT [phone] 03/04/2013

## 2013-03-04 NOTE — Progress Notes (Addendum)
CSW called [facility] to follow up and see if they were able to reach pt's wife to determine primary insurance provider. Kim, admissions rep, explained that her coworker Clydie Braun would know the answer to this--Kim is attempting to reach Clydie Braun now, and Clydie Braun will call CSW. CSW will ask about bed availability for pt at [facility], as this facility is the only one in [facility] that could take pt. Otherwise, family will need to choose from list of facilities in [facility] that made bed offers.  Addendum: Facility still attempting to reach pt's wife. CSW called pt's wife and left voicemail on home answering machine. CSW provided facility with phone number for secondary contact in pt's chart, Jennette Bill [phone]). CSW explained this may be pt's daughter. Facility [employer].   Maryclare Labrador, MSW, [facility] Clinical Social Worker [phone]

## 2013-03-05 DIAGNOSIS — J95821 Acute postprocedural respiratory failure: Secondary | ICD-10-CM

## 2013-03-05 LAB — BASIC METABOLIC PANEL
BUN: 144 mg/dL — ABNORMAL HIGH (ref 6–23)
CO2: 22 mEq/L (ref 19–32)
Calcium: 9.1 mg/dL (ref 8.4–10.5)
Chloride: 112 mEq/L (ref 96–112)
Creatinine, Ser: 1.99 mg/dL — ABNORMAL HIGH (ref 0.50–1.35)
GFR calc Af Amer: 37 mL/min — ABNORMAL LOW (ref >90)
Potassium: 4.5 mEq/L (ref 3.5–5.1)
Sodium: 146 mEq/L — ABNORMAL HIGH (ref 135–145)

## 2013-03-05 LAB — GLUCOSE, CAPILLARY
Glucose-Capillary: 193 mg/dL — ABNORMAL HIGH (ref 70–99)
Glucose-Capillary: 189 mg/dL — ABNORMAL HIGH (ref 70–99)
Glucose-Capillary: 164 mg/dL — ABNORMAL HIGH (ref 70–99)

## 2013-03-05 MED ORDER — LEVETIRACETAM 100 MG/ML PO SOLN: 500.0000 mg | Freq: Two times a day (BID) | ORAL | Status: AC

## 2013-03-05 MED ORDER — FREE WATER: 300.0000 mL | Status: AC

## 2013-03-05 MED ORDER — AMLODIPINE BESYLATE 10 MG PO TABS: 10.0000 mg | ORAL_TABLET | Freq: Every day | ORAL | Status: AC

## 2013-03-05 MED ORDER — CARVEDILOL 3.125 MG PO TABS: 3.1250 mg | ORAL_TABLET | Freq: Two times a day (BID) | ORAL | Status: AC

## 2013-03-05 MED ORDER — LABETALOL HCL 5 MG/ML IV SOLN: 10.0000 mg | INTRAVENOUS | Status: AC | PRN

## 2013-03-05 MED ORDER — SODIUM CHLORIDE 0.9 % IV SOLN: 20.0000 mL | INTRAVENOUS | Status: AC

## 2013-03-05 MED ORDER — ACETAMINOPHEN 160 MG/5ML PO SOLN: 650.0000 mg | ORAL | Status: AC | PRN

## 2013-03-05 MED ORDER — INSULIN GLARGINE 100 UNIT/ML ~~LOC~~ SOLN: 35.0000 [IU] | Freq: Every day | SUBCUTANEOUS | Status: AC

## 2013-03-05 MED ORDER — INSULIN ASPART 100 UNIT/ML ~~LOC~~ SOLN: 0.0000 [IU] | SUBCUTANEOUS | Status: AC

## 2013-03-05 MED ORDER — BENAZEPRIL HCL 20 MG PO TABS: 20.0000 mg | ORAL_TABLET | Freq: Every day | ORAL | Status: AC

## 2013-03-05 MED ORDER — VITAL AF 1.2 CAL PO LIQD: 1000.0000 mL | ORAL | Status: AC

## 2013-03-05 MED ORDER — ASPIRIN 81 MG PO CHEW: 81.0000 mg | CHEWABLE_TABLET | Freq: Every day | ORAL | Status: AC

## 2013-03-05 MED ORDER — INSULIN ASPART 100 UNIT/ML ~~LOC~~ SOLN: 8.0000 [IU] | Freq: Four times a day (QID) | SUBCUTANEOUS | Status: AC

## 2013-03-05 MED ORDER — PRO-STAT SUGAR FREE PO LIQD: 30.0000 mL | Freq: Two times a day (BID) | ORAL | Status: AC

## 2013-03-05 NOTE — Discharge Summary (Signed)
Physician Discharge Summary  Patient ID: Austin Salas MRN: [id number] DOB/AGE: 1940/09/21 72 y.o.  Admit date: 02/15/2013 Discharge date: 03/05/2013  Problem List Principal Problem:   Subdural hematoma Active Problems:   Hypertension   DM2 (diabetes mellitus, type 2)   CKD (chronic kidney disease) stage 3, GFR 30-59 ml/min   Coma   Cerebral edema   Acute respiratory failure   Tracheostomy status   SDH (subdural hematoma)  HPI: His recent hospitalization has been reviewed and is well documented. He returned to work recently. On the day of admission, he returned from work feeling poorly and shortly thereafter suffered syncopal episode. Brought to [facility] ED by EMS where he required intubation. Emergent CT head revealed large L SDH with severe midline shift. Seen by NS Wynetta Emery) and not deemed to be operable. Wife understands grave prognosis but is not willing to DC vent presently. Therefore, PCCM asked to admit     Hospital Course: SIGNIFICANT EVENTS / STUDIES:  11/24 CT head: Large acute left subdural hematoma with maximal diameter of 2.3 cm, causing 1.5 cm left-to-right midline shift, right lateral ventricular enlargement and effacement of the basilar cisterns.  11/24 NS consult Wynetta Emery): very grave prognosis with no chance of survival and patient is 72 years old having his second subdural in 4 months and this one being a large acute subdural with minimal neurologic exam. Recommended supportive care only and DNR  11/24 Made DNR after PCCM discussion with wife.  11/28 Trach  12/1 PEG by IR. Head CT with decrease in size of L SDH, decrease in mass effect.  12/2 Left craniotomy (Dr. Wynetta Emery)  12/3 Trach to ATC  12/10- Na rising  12/11 bloody trach secretions, Na trending back down, awaiting SNF  12-12 change to #6 cuffless trach proximal xlt  LINES / TUBES:  ETT 11/24 >> 11/28  Trach (DF) 11/28>>>  PEG 12/1 >>>  CULTURES:  None  ANTIBIOTICS:  None   ASSESSMENT / PLAN:  PULMONARY   A:  Acute resp failure due to neurological catastrophe.  Trach tube dependent  P:  - ATC as tolerated, continue 28% humidified air.  CARDIOVASCULAR  A:  Hypertension  P:  - Continue current Rx.  RENAL  Lab Results   Component  Value  Date    CREATININE  1.99*  03/05/2013    CREATININE  2.27*  03/04/2013    CREATININE  2.14*  03/03/2013    A:  CKD, stage III.  Hypernatremia  P:  - Cont free water 300 q4.  - KVO D5W 12/11  - follow na closely, goal to 140-145 then kvo, as brain edema concerns have resolved over days  - BMET in AM.  - Replace electrolytes as indicated.  GASTROINTESTINAL  A:  No issues.  P:  - SUP: IV PPI.  - Cont TF.  HEMATOLOGIC  A:  Mild anemia without evidence of acute blood loss.  P:  - Monitor CBC intermittently.  ENDOCRINE  CBG (last 3)   Recent Labs  03/05/13 0056 03/05/13 0408 03/05/13 0839  GLUCAP 242* 189* 164*     A:  DM II  P:  - SSI ordered.  - CBGs as ordered.  - Increase lantus 12/11 to 35 units qhs  - increase TF coverage 8 units novolog q6 12/11  - d/c PO DM rx for now until better control  - ?have nutrition see to change TF formula  NEUROLOGIC  A:  Massive SDH.  Cerebral edema, improved  S/P left craniotomy  12/2.  Coma - resolved.  P:  - PRN fentanyl for agitation.  - Follow NS recs.  - PT/OT efforts  Awaiting SNF placement      Labs at discharge Lab Results  Component Value Date   CREATININE 1.99* 03/05/2013   BUN 144* 03/05/2013   NA 146* 03/05/2013   K 4.5 03/05/2013   CL 112 03/05/2013   CO2 22 03/05/2013   Lab Results  Component Value Date   WBC 11.7* 03/03/2013   HGB 9.8* 03/03/2013   HCT 32.1* 03/03/2013   MCV 97.0 03/03/2013   PLT 332 03/03/2013   Lab Results  Component Value Date   ALT 24 02/15/2013   AST 32 02/15/2013   ALKPHOS 144* 02/15/2013   BILITOT 0.4 02/15/2013   Lab Results  Component Value Date   INR 1.01 02/15/2013    Current radiology studies Dg Chest Portable  1 View  03/04/2013   CLINICAL DATA:  Tachypnea.  EXAM: PORTABLE CHEST - 1 VIEW  COMPARISON:  02/26/2013  FINDINGS: Left central line tip proximal superior vena cava. No gross pneumothorax  Tracheostomy to tip 2.8 cm above the carina.  Cardiomegaly.  Slight progression in asymmetric airspace disease which may represent pulmonary edema. Left base subsegmental atelectasis.  Calcified mildly tortuous aorta.  IMPRESSION: Slight progression in asymmetric airspace disease which may represent pulmonary edema. Left base subsegmental atelectasis.  Please see above.   Electronically Signed   By: Bridgett Larsson M.D.   On: 03/04/2013 14:00    Disposition:       Discharge Orders   Future Orders Complete By Expires   Discharge to SNF when bed available  As directed        Medication List    STOP taking these medications       amLODipine-benazepril 10-20 MG per capsule  Commonly known as:  LOTREL     glimepiride 2 MG tablet  Commonly known as:  AMARYL     pioglitazone 45 MG tablet  Commonly known as:  ACTOS     rosuvastatin 10 MG tablet  Commonly known as:  CRESTOR     valsartan-hydrochlorothiazide 320-25 MG per tablet  Commonly known as:  DIOVAN-HCT      TAKE these medications       acetaminophen 160 MG/5ML solution  Commonly known as:  TYLENOL  Place 20.3 mLs (650 mg total) into feeding tube every 4 (four) hours as needed for fever.     amLODipine 10 MG tablet  Commonly known as:  NORVASC  Place 1 tablet (10 mg total) into feeding tube daily.     aspirin 81 MG chewable tablet  Place 1 tablet (81 mg total) into feeding tube daily.     benazepril 20 MG tablet  Commonly known as:  LOTENSIN  Place 1 tablet (20 mg total) into feeding tube daily.     carvedilol 3.125 MG tablet  Commonly known as:  COREG  Place 1 tablet (3.125 mg total) into feeding tube 2 (two) times daily with a meal.     feeding supplement (PRO-STAT SUGAR FREE 64) Liqd  Place 30 mLs into feeding tube 2 (two)  times daily.     feeding supplement (VITAL AF 1.2 CAL) Liqd  Place 1,000 mLs into feeding tube continuous.     free water Soln  Place 300 mLs into feeding tube every 4 (four) hours.     insulin aspart 100 UNIT/ML injection  Commonly known as:  novoLOG  Inject 0-20 Units into  the skin every 4 (four) hours.     insulin aspart 100 UNIT/ML injection  Commonly known as:  novoLOG  Inject 8 Units into the skin every 6 (six) hours.     insulin glargine 100 UNIT/ML injection  Commonly known as:  LANTUS  Inject 0.35 mLs (35 Units total) into the skin at bedtime.     labetalol 5 MG/ML injection  Commonly known as:  NORMODYNE,TRANDATE  Inject 2-8 mLs (10-40 mg total) into the vein every 10 (ten) minutes as needed.     levETIRAcetam 100 MG/ML solution  Commonly known as:  KEPPRA  Place 5 mLs (500 mg total) into feeding tube 2 (two) times daily.     sodium chloride 0.9 % infusion  Inject 20 mLs into the vein continuous.          Discharged Condition: fair  Time spent on discharge greater than 40 minutes.  Vital signs at Discharge. Temp:  [99 F (37.2 C)-99.5 F (37.5 C)] 99.5 F (37.5 C) (12/12 0836) Pulse Rate:  [65-80] 66 (12/12 0836) Resp:  [21-32] 27 (12/12 0836) BP: (112-149)/(40-56) 112/42 mmHg (12/12 0836) SpO2:  [95 %-100 %] 97 % (12/12 0836) FiO2 (%):  [26 %-28 %] 28 % (12/12 0836) Weight:  [245 lb 9.5 oz (111.4 kg)] 245 lb 9.5 oz (111.4 kg) (12/12 0504) Office follow up Special Information or instructions. Follow up per [facility].  Signed: Brett Canales Minor ACNP [facility] Pollack PCCM Pager [phone] till 3 pm If no answer page [phone] 03/05/2013, 11:44 AM  Patient seen and examined, agree with above note.  I dictated the care and orders written for this patient under my direction.  Alyson Reedy, MD [phone]

## 2013-03-05 NOTE — Progress Notes (Addendum)
CSW called [facility] office--they are sending car out to home address listed in the chart in an attempt to locate pt's wife and alert her that pt is ready for discharge. CSW will either facilitate discharge to SNF or home, depending on family's preference.  Addendum: address in the chart for pt is not correct, per police officer with [employer] [employer]. Officer states there is a [address] not [address]. Officer asked for admission date that pt came to hospital, and CSW provided this. Officer says she can verify address this way. CSW standing by to hear back from police.  Maryclare Labrador, MSW, [facility] Clinical Social Worker [phone]

## 2013-03-05 NOTE — Progress Notes (Addendum)
CSW received voicemail from [facility]--facility states they are not able to offer a bed, but they also stated that our records reflect [employer] as primary and [employer] as secondary; according to SNF, Susa Simmonds is primary and BCBS is secondary. CSW called [facility], and informed them of this update. Duke Salvia is contacting [employer] authorization, and as soon as they have this CSW will call phone numbers for pt's wife Fulton Mole to notify her that pt is discharging to [facility].  Addendum: CSW left voicemails on [facility] and [facility] mobile phones explaining pt is medically ready for discharge and can go to [facility]. Facility, CSW, and RNCM have been attempting to reach [place] and [place] for the last 2 days. Facility and CSW have left multiple messages requesting return calls. CSW left voicemail for [place] and [place] at 10:00am stating that if CSW does not hear from them by 11:15am, CSW is going to call the [facility] department to come to the home and locate them, as the hospital needs a safe discharge location for pt. CSW also offered to arrange non-emergent ambulance for pt to come home, if this is what the family chooses. CSW on standby and will call Sheriff's department at 11:15am if CSW doesn't hear from family.  Maryclare Labrador, MSW, [facility] Clinical Social Worker [phone]

## 2013-03-05 NOTE — Procedures (Signed)
First Trach Change  Changed 6 cuffed XLT to 6 cuffless XLT with good color change on CO2 detection.  Bilateral air movement.  No need for CXR.  Alyson Reedy, M.D. [facility] Pulmonary/Critical Care Medicine. Pager: [phone]. After hours pager: [phone].

## 2013-03-05 NOTE — Progress Notes (Signed)
PULMONARY  / CRITICAL CARE MEDICINE  Name: Austin Salas MRN: [id number] DOB: [birth year]    ADMISSION DATE:  02/15/2013  REFERRING MD :  EDP PRIMARY SERVICE: PCCM  BRIEF PATIENT DESCRIPTION:  72 M M s/p recent craniotomy for R sided SDH. Recovered well from that event. Admitted with large L sided SDH, severe cerebral edema, coma. Began to show improvement on 12/1, repeat head CT confirmed decrease in edema and midline shift.  NS performed left craniotomy on 12/2.  SIGNIFICANT EVENTS / STUDIES:  11/24 CT head: Large acute left subdural hematoma with maximal diameter of 2.3 cm, causing 1.5 cm left-to-right midline shift, right lateral ventricular enlargement and effacement of the basilar cisterns. 11/24 NS consult Wynetta Emery): very grave prognosis with no chance of survival and patient is 72 years old having his second subdural in 4 months and this one being a large acute subdural with minimal neurologic exam.  Recommended supportive care only and DNR 11/24 Made DNR after PCCM discussion with wife. 11/28 Trach 12/1 PEG by IR.  Head CT with decrease in size of L SDH, decrease in mass effect. 12/2 Left craniotomy (Dr. Wynetta Emery) 12/3 Trach to ATC 12/10- Na rising  12/11 bloody trach secretions, Na trending back down, awaiting SNF  12-12 change to #6 cuffless trach proximal xlt  LINES / TUBES: ETT 11/24 >> 11/28 Trach (DF) 11/28>>> PEG 12/1 >>>  CULTURES: None  ANTIBIOTICS: None  SUBJECTIVE:  Cont to tolerate ATC.  Bloody trach secretions.  Mildly tachypneic   VITAL SIGNS: Temp:  [99 F (37.2 C)-99.5 F (37.5 C)] 99.5 F (37.5 C) (12/12 0836) Pulse Rate:  [65-80] 66 (12/12 0836) Resp:  [21-32] 27 (12/12 0836) BP: (112-149)/(40-56) 112/42 mmHg (12/12 0836) SpO2:  [95 %-100 %] 97 % (12/12 0836) FiO2 (%):  [26 %-28 %] 28 % (12/12 0836) Weight:  [245 lb 9.5 oz (111.4 kg)] 245 lb 9.5 oz (111.4 kg) (12/12 0504) HEMODYNAMICS:   VENTILATOR SETTINGS: Vent Mode:  [-]  FiO2 (%):  [26  %-28 %] 28 %  INTAKE / OUTPUT: Intake/Output     12/11 0701 - 12/12 0700 12/12 0701 - 12/13 0700   I.V. (mL/kg) 1100 (9.9) 360.3 (3.2)   NG/GT 3291.4 323.6   Total Intake(mL/kg) 4391.4 (39.4) 683.9 (6.1)   Urine (mL/kg/hr) 1925 (0.7) 350 (0.7)   Total Output 1925 350   Net +2466.4 +333.9        Stool Occurrence 1 x     PHYSICAL EXAMINATION: General:  Chronically ill appearing, trached. Neuro:  No response, does not follow commands, does not respond to pain HEENT: Dressing to left parietal region C/D/I. Trach in place with bloody secretions Cardiovascular: mild regular tachy Lungs: resps even non labored on ATC, few scattered rhonchi, mildly tachypneic  Abdomen: obese, soft, NT, +BS Ext: warm, no edema  LABS:   Recent Labs Lab 02/27/13 0530 03/03/13 0445  HGB 10.3* 9.8*  HCT 32.0* 32.1*  WBC 12.9* 11.7*  PLT 278 332     Recent Labs Lab 02/27/13 0530 03/01/13 0515 03/03/13 0445 03/04/13 0645 03/05/13 0424  NA 151* 152* 156* 148* 146*  K 4.3 4.5 4.7 4.6 4.5  CL 114* 117* 119* 116* 112  CO2 24 24 24 20 22   GLUCOSE 241* 261* 247* 301* 203*  BUN 93* 105* 133* 154* 144*  CREATININE 1.71* 1.77* 2.14* 2.27* 1.99*  CALCIUM 9.4 9.7 9.5 9.2 9.1  MG 2.6*  --  2.8*  --   --   PHOS 3.3  --  4.8*  --   --     CXR: no new CXR 12/10  ASSESSMENT / PLAN:  PULMONARY A:  Acute resp failure due to neurological catastrophe.   Trach tube dependent P:   - ATC as tolerated, continue 28% humidified air.   CARDIOVASCULAR A:  Hypertension P:  - Continue current Rx.  RENAL Lab Results  Component Value Date   CREATININE 1.99* 03/05/2013   CREATININE 2.27* 03/04/2013   CREATININE 2.14* 03/03/2013    A:   CKD, stage III. Hypernatremia P:   - Cont free water 300 q4. - KVO D5W 12/11 - follow na closely, goal to 140-145 then kvo, as brain edema concerns have resolved over days - BMET in AM. - Replace electrolytes as indicated.  GASTROINTESTINAL A:   No  issues. P:   - SUP: IV PPI. - Cont TF.  HEMATOLOGIC A:   Mild anemia without evidence of acute blood loss. P:  - Monitor CBC intermittently.  ENDOCRINE A:   DM II P:   - SSI ordered. - CBGs as ordered. - Increase lantus 12/11 to 35 units qhs - increase TF coverage 8 units novolog q6 12/11 - d/c PO DM rx for now until better control  - ?have nutrition see to change TF formula   NEUROLOGIC A:   Massive SDH. Cerebral edema, improved S/P left craniotomy 12/2. Coma - resolved. P:   - PRN fentanyl for agitation. - Follow NS recs. - PT/OT efforts   Awaiting SNF placement   [facility] Minor ACNP Adolph Pollack PCCM Pager [phone] till 3 pm If no answer page [phone] 03/05/2013, 11:34 AM  Unable to get a hold of family for two days now, patient has a SNF bed and is awaiting placement.  Will change trach to a cuffless six xlt today.  Sheriff department going to check on family and once able to get signatures will discharge to SNF.  Patient seen and examined, agree with above note.  I dictated the care and orders written for this patient under my direction.  Alyson Reedy, MD [phone]

## 2013-03-05 NOTE — Progress Notes (Signed)
Pt transferred to Rehab at 14:30. Daughter @ [facility] at time of discharge.

## 2013-03-22 ENCOUNTER — Telehealth: Payer: Self-pay

## 2013-03-22 NOTE — Telephone Encounter (Signed)
Patient past away per Obituary in [place] [employer] °

## 2013-03-25 DEATH — deceased

## 2014-01-17 IMAGING — CT CT HEAD W/O CM
1 series · 15 of 30 positions shown, 19 images · non-contrast
Comparison: CT head without contrast 02/22/2013.

CLINICAL DATA: Evaluate subdural hematoma.

EXAM:
CT HEAD WITHOUT CONTRAST
TECHNIQUE: Contiguous axial images were obtained from the base of the skull
through the vertex without intravenous contrast.

[Series 2: head 5.0 h30s · axial · 0.51mm/px · z∈[+1029,+1179]mm · 15 of 34 slices shown, 19 images]
[im 2/34  brain]
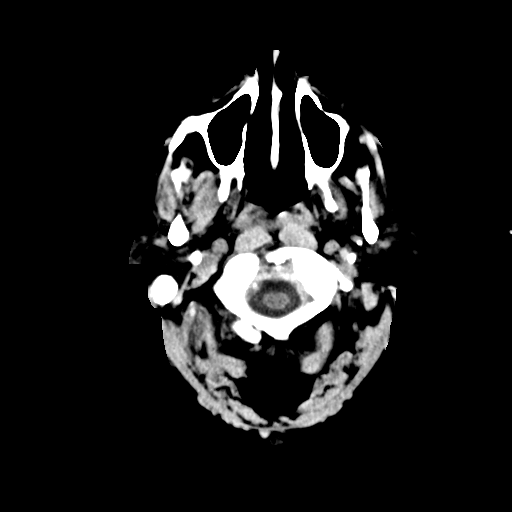
[im 2/34  bone]
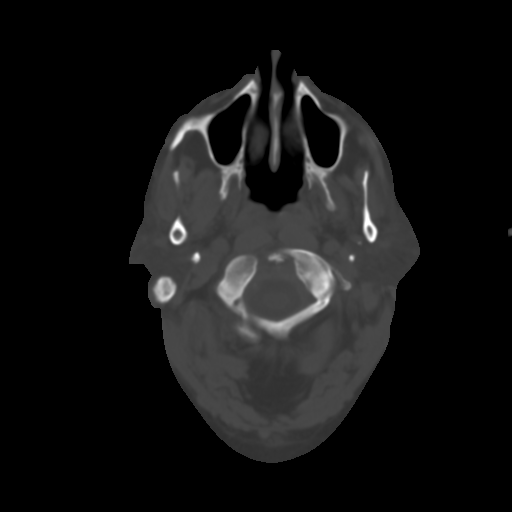
[im 4/34  brain]
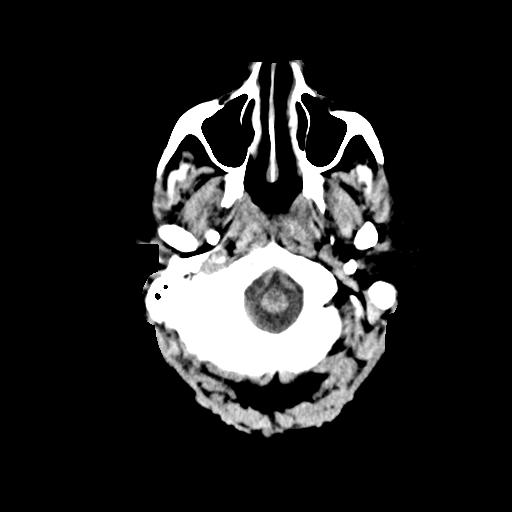
[im 6/34  brain]
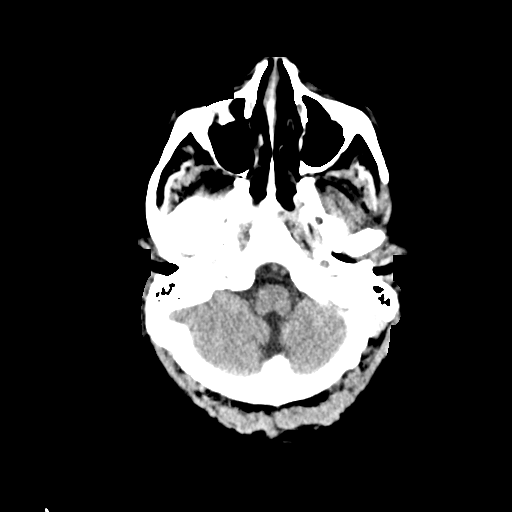
[im 8/34  brain]
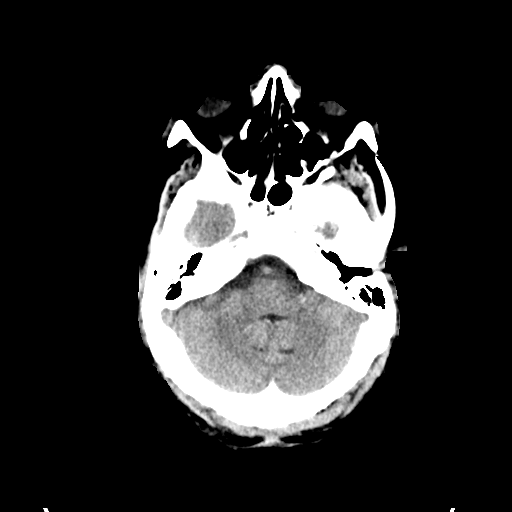
[im 11/34  brain]
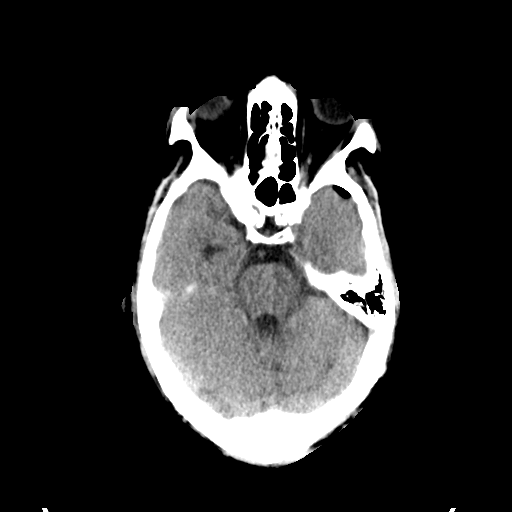
[im 11/34  bone]
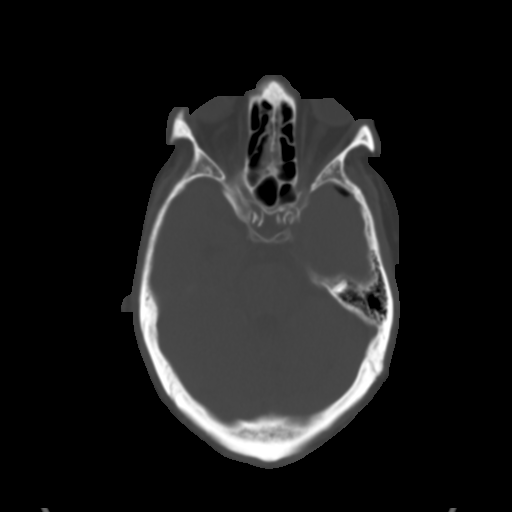
[im 13/34  brain]
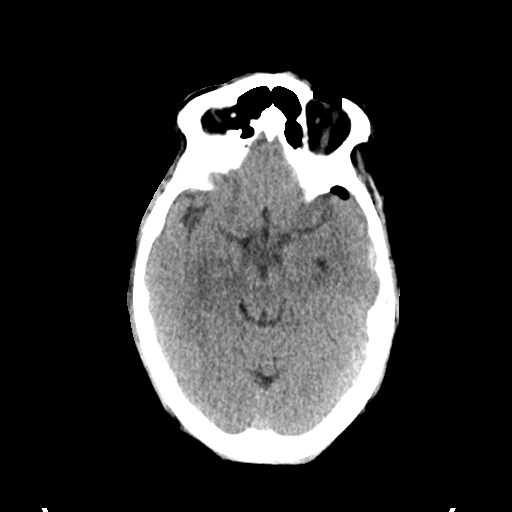
[im 15/34  brain]
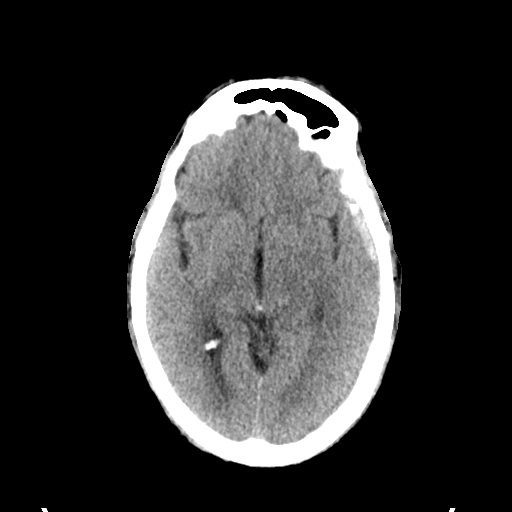
[im 18/34  brain]
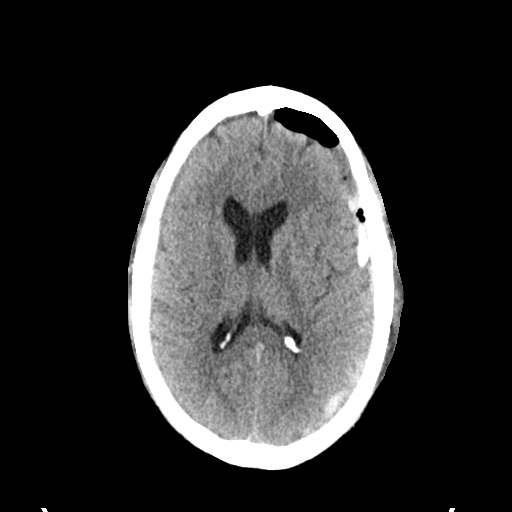
[im 19/34  brain]
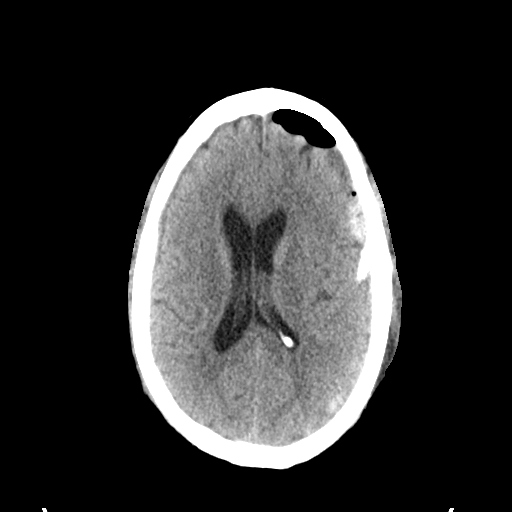
[im 19/34  bone]
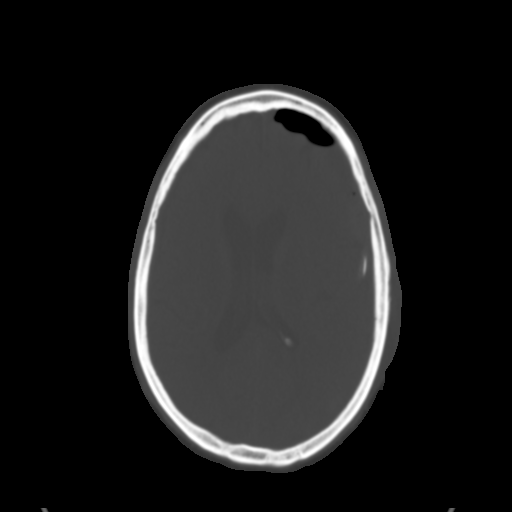
[im 21/34  brain]
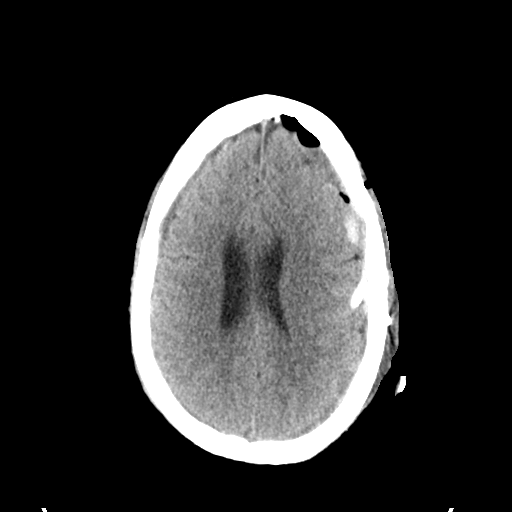
[im 23/34  brain]
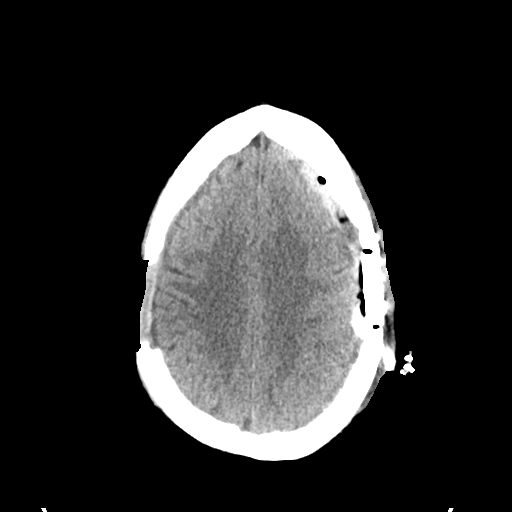
[im 26/34  brain]
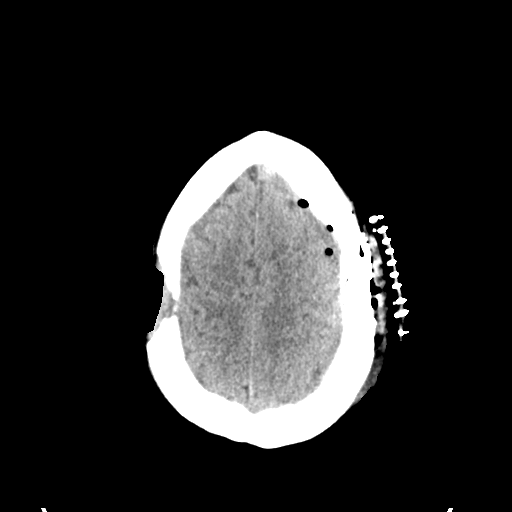
[im 28/34  brain]
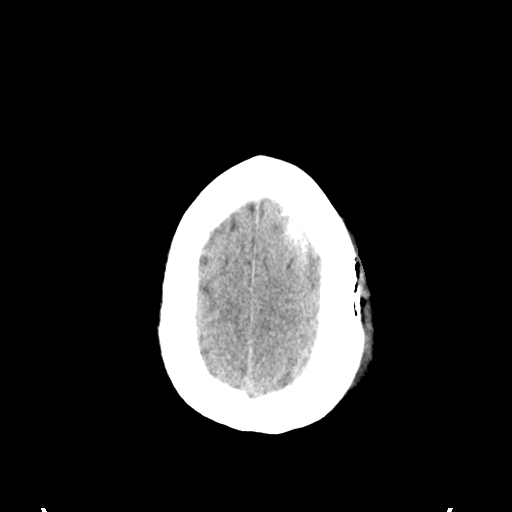
[im 28/34  bone]
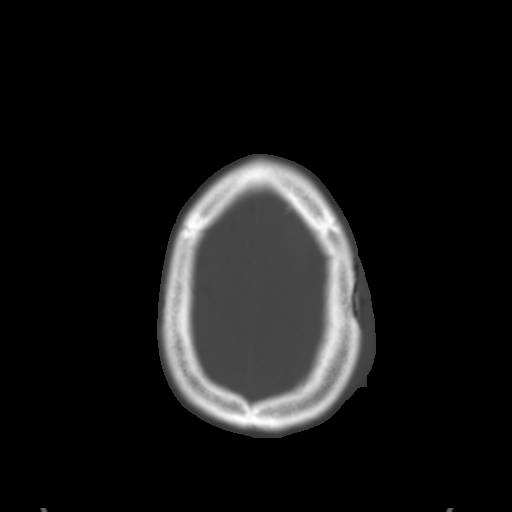
[im 30/34  brain]
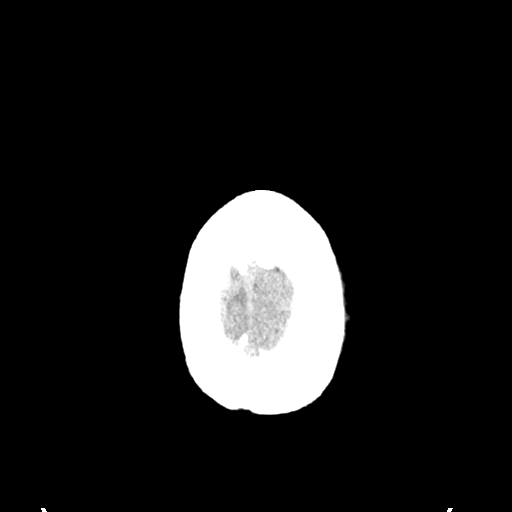
[im 32/34  brain]
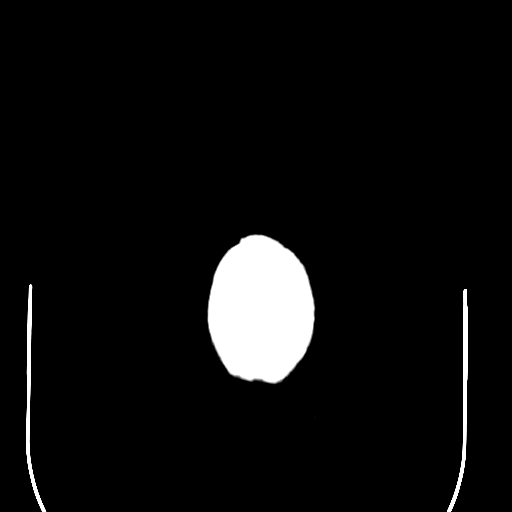

[15 of 30 positions shown; findings below may reference images not displayed]

FINDINGS: The patient is now status post left frontal craniotomy. An
extra-axial drain is in place. The overall size of the extra-axial
collection is significantly reduced. Pneumocephalus is now present
on the left. No right-sided pneumocephalus is evident. Midline shift
is significantly improved, now measuring only 5 mm. There is mild
prominence of the extra-axial space on the right, likely related to
the surgery and relief of pressure. There is significant reduction
in effacement of the left lateral ventricle. The basal cisterns are
intact. The 4th ventricle is once again improved.

The paranasal sinuses and mastoid air cells are clear.
IMPRESSION: Right

1. Interval left craniotomy for drainage of the extra-axial fluid
collection.
2. Significant reduction mass effect and midline shift as described.
3. Slight prominence of extra-axial fluid on the right may be
related to the shift following relief of pressure.

## 2014-01-25 IMAGING — CR DG CHEST 1V PORT
1 series · 1 of 1 positions shown · non-contrast
Comparison: 02/26/2013

CLINICAL DATA: Tachypnea.

EXAM:
PORTABLE CHEST - 1 VIEW

[AP]
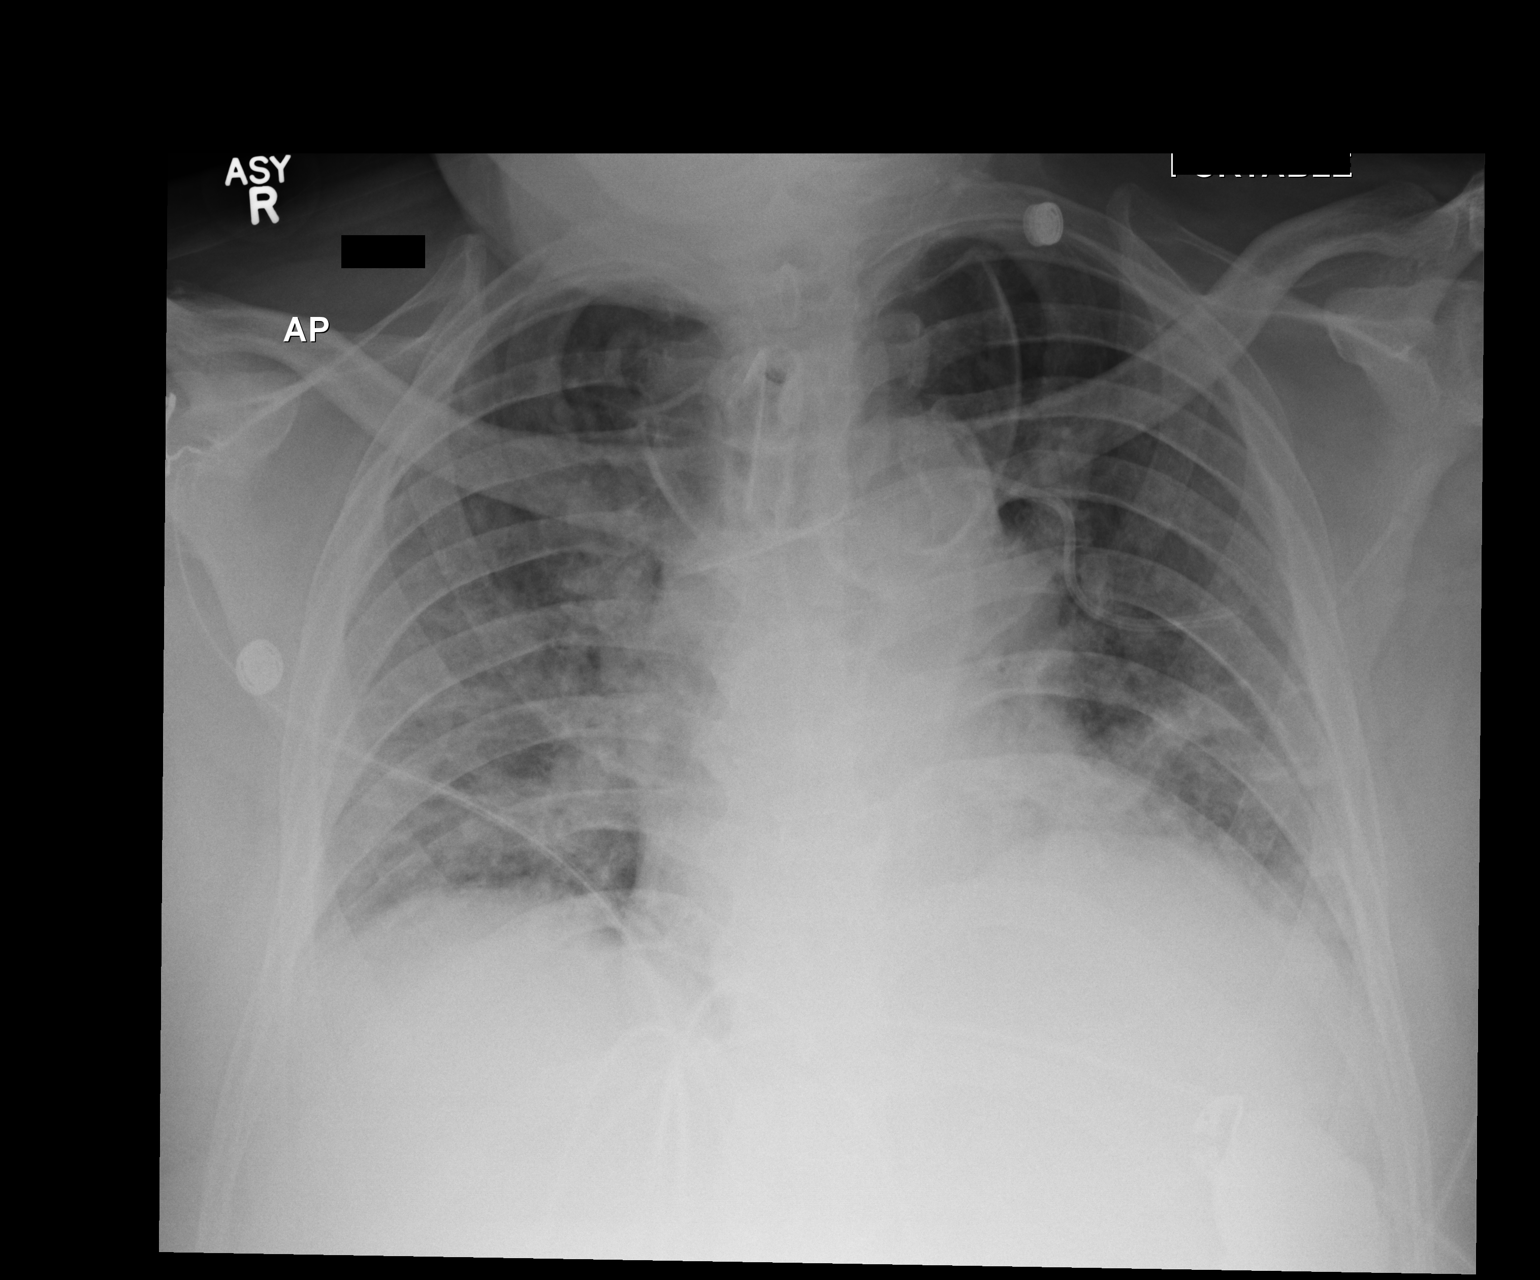

[1 of 1 positions shown; findings below may reference images not displayed]

FINDINGS: Left central line tip proximal superior vena cava. No gross
pneumothorax

Tracheostomy to tip 2.8 cm above the carina.

Cardiomegaly.

Slight progression in asymmetric airspace disease which may
represent pulmonary edema. Left base subsegmental atelectasis.

Calcified mildly tortuous aorta.
IMPRESSION: Slight progression in asymmetric airspace disease which may
represent pulmonary edema. Left base subsegmental atelectasis.

Please see above.
